# Patient Record
Sex: Female | Born: 1970 | Race: White | Hispanic: No | Marital: Married | Smoking: Current every day smoker
Health system: Southern US, Community
[De-identification: ages and names within clinical notes are randomized; demographics above are authoritative.]

## PROBLEM LIST (undated history)

## (undated) DIAGNOSIS — J449 Chronic obstructive pulmonary disease, unspecified: Secondary | ICD-10-CM

## (undated) DIAGNOSIS — T7840XA Allergy, unspecified, initial encounter: Secondary | ICD-10-CM

## (undated) DIAGNOSIS — F419 Anxiety disorder, unspecified: Secondary | ICD-10-CM

## (undated) DIAGNOSIS — M1990 Unspecified osteoarthritis, unspecified site: Secondary | ICD-10-CM

## (undated) DIAGNOSIS — F32A Depression, unspecified: Secondary | ICD-10-CM

## (undated) DIAGNOSIS — F17200 Nicotine dependence, unspecified, uncomplicated: Secondary | ICD-10-CM

## (undated) DIAGNOSIS — D242 Benign neoplasm of left breast: Secondary | ICD-10-CM

## (undated) DIAGNOSIS — E039 Hypothyroidism, unspecified: Secondary | ICD-10-CM

## (undated) DIAGNOSIS — I10 Essential (primary) hypertension: Secondary | ICD-10-CM

## (undated) DIAGNOSIS — Z8619 Personal history of other infectious and parasitic diseases: Secondary | ICD-10-CM

## (undated) DIAGNOSIS — E119 Type 2 diabetes mellitus without complications: Secondary | ICD-10-CM

## (undated) HISTORY — DX: Personal history of other infectious and parasitic diseases: Z86.19

## (undated) HISTORY — PX: WISDOM TOOTH EXTRACTION: SHX21

## (undated) HISTORY — DX: Allergy, unspecified, initial encounter: T78.40XA

---

## 1990-10-19 HISTORY — PX: TUBAL LIGATION: SHX77

## 1998-07-17 ENCOUNTER — Emergency Department (HOSPITAL_COMMUNITY): Admission: EM | Admit: 1998-07-17 | Discharge: 1998-07-17 | Payer: Self-pay | Admitting: Emergency Medicine

## 1999-07-14 ENCOUNTER — Emergency Department (HOSPITAL_COMMUNITY): Admission: EM | Admit: 1999-07-14 | Discharge: 1999-07-14 | Payer: Self-pay | Admitting: *Deleted

## 1999-07-15 ENCOUNTER — Emergency Department (HOSPITAL_COMMUNITY): Admission: EM | Admit: 1999-07-15 | Discharge: 1999-07-15 | Payer: Self-pay | Admitting: Emergency Medicine

## 1999-10-15 ENCOUNTER — Encounter: Payer: Self-pay | Admitting: Emergency Medicine

## 1999-10-15 ENCOUNTER — Emergency Department (HOSPITAL_COMMUNITY): Admission: EM | Admit: 1999-10-15 | Discharge: 1999-10-15 | Payer: Self-pay | Admitting: Emergency Medicine

## 2000-01-03 ENCOUNTER — Emergency Department (HOSPITAL_COMMUNITY): Admission: EM | Admit: 2000-01-03 | Discharge: 2000-01-03 | Payer: Self-pay | Admitting: Emergency Medicine

## 2000-10-25 ENCOUNTER — Emergency Department (HOSPITAL_COMMUNITY): Admission: EM | Admit: 2000-10-25 | Discharge: 2000-10-25 | Payer: Self-pay | Admitting: Emergency Medicine

## 2002-02-15 ENCOUNTER — Emergency Department (HOSPITAL_COMMUNITY): Admission: EM | Admit: 2002-02-15 | Discharge: 2002-02-16 | Payer: Self-pay | Admitting: Emergency Medicine

## 2004-12-13 ENCOUNTER — Emergency Department (HOSPITAL_COMMUNITY): Admission: EM | Admit: 2004-12-13 | Discharge: 2004-12-13 | Payer: Self-pay | Admitting: Family Medicine

## 2006-03-21 ENCOUNTER — Emergency Department (HOSPITAL_COMMUNITY): Admission: EM | Admit: 2006-03-21 | Discharge: 2006-03-21 | Payer: Self-pay | Admitting: Emergency Medicine

## 2011-01-26 ENCOUNTER — Emergency Department (HOSPITAL_COMMUNITY)
Admission: EM | Admit: 2011-01-26 | Discharge: 2011-01-26 | Disposition: A | Discharge status: Home / Self Care | Payer: Self-pay | Attending: Emergency Medicine | Admitting: Emergency Medicine

## 2011-01-26 DIAGNOSIS — R490 Dysphonia: Secondary | ICD-10-CM | POA: Insufficient documentation

## 2011-01-26 DIAGNOSIS — F17200 Nicotine dependence, unspecified, uncomplicated: Secondary | ICD-10-CM | POA: Insufficient documentation

## 2011-01-26 DIAGNOSIS — J069 Acute upper respiratory infection, unspecified: Secondary | ICD-10-CM | POA: Insufficient documentation

## 2011-01-26 LAB — RAPID STREP SCREEN (MED CTR MEBANE ONLY): Streptococcus, Group A Screen (Direct): NEGATIVE

## 2013-08-24 ENCOUNTER — Emergency Department (HOSPITAL_COMMUNITY): Payer: Self-pay

## 2013-08-24 ENCOUNTER — Emergency Department (HOSPITAL_COMMUNITY)
Admission: EM | Admit: 2013-08-24 | Discharge: 2013-08-24 | Disposition: A | Discharge status: Home / Self Care | Payer: Self-pay | Attending: Emergency Medicine | Admitting: Emergency Medicine

## 2013-08-24 ENCOUNTER — Encounter (HOSPITAL_COMMUNITY): Payer: Self-pay | Admitting: Emergency Medicine

## 2013-08-24 DIAGNOSIS — M7662 Achilles tendinitis, left leg: Secondary | ICD-10-CM

## 2013-08-24 DIAGNOSIS — M7730 Calcaneal spur, unspecified foot: Secondary | ICD-10-CM | POA: Insufficient documentation

## 2013-08-24 DIAGNOSIS — M7732 Calcaneal spur, left foot: Secondary | ICD-10-CM

## 2013-08-24 DIAGNOSIS — M7660 Achilles tendinitis, unspecified leg: Secondary | ICD-10-CM | POA: Insufficient documentation

## 2013-08-24 MED ORDER — IBUPROFEN 400 MG PO TABS
800.0000 mg | ORAL_TABLET | Freq: Once | ORAL | Status: AC
  Administered 2013-08-24: 800 mg via ORAL
  Filled 2013-08-24: qty 2

## 2013-08-24 MED ORDER — IBUPROFEN 800 MG PO TABS: 800.0000 mg | ORAL_TABLET | Freq: Three times a day (TID) | ORAL | Status: DC

## 2013-08-24 NOTE — ED Notes (Signed)
Pt in c/o pain to her right heel with swelling, denies injury, states she is on her feet all day while at work and the swelling is making it hard for her to get her shoe on, increased pain with walking

## 2013-08-24 NOTE — ED Provider Notes (Signed)
CSN: [id number]     Arrival date & time 08/24/13  1718 History  This chart was scribed for non-physician practitioner Dierdre Forth, PA-C working with Shon Baton, MD by Clydene Laming, ED Scribe. This patient was seen in room TR07C/TR07C and the patient's care was started at 5:44 PM.   Chief Complaint  Patient presents with  . Foot Pain    The history is provided by the patient and medical records. No language interpreter was used.   HPI Comments: Donna Whitaker is a 42 y.o. female who presents to the Emergency Department complaining of right heel pain onset four days ago with associated swelling. 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  Her pain, however, does not resolve with rest like her typical foot pain. Pt denies any trauma to the foot. The pain is worsened with pressure on the foot but frequently is painful alone. Pt denies fever, chills, nausea vomiting, redness, numbness, tingling, weakness. Pt denies any other known medical problems.    History reviewed. No pertinent past medical history. No past surgical history on file. No family history on file. History  Substance Use Topics  . Smoking status: Not on file  . Smokeless tobacco: Not on file  . Alcohol Use: Not on file   OB History   Grav Para Term Preterm Abortions TAB SAB Ect Mult Living                 Review of Systems  Constitutional: Negative for fever and chills.  Gastrointestinal: Negative for nausea and vomiting.  Musculoskeletal: Positive for arthralgias, joint swelling and myalgias. Negative for back pain, neck pain and neck stiffness.  Skin: Negative for wound.  Neurological: Negative for numbness.  Hematological: Does not bruise/bleed easily.  Psychiatric/Behavioral: The patient is not nervous/anxious.   All other systems reviewed and are negative.    Allergies  Betadine and Vicodin  Home Medications   Current Outpatient Rx   Name  Route  Sig  Dispense  Refill  . acetaminophen (TYLENOL) 500 MG tablet   Oral   Take 1,000 mg by mouth every 6 (six) hours as needed.         Marland Kitchen ibuprofen (ADVIL,MOTRIN) 100 MG tablet   Oral   Take 200 mg by mouth every 6 (six) hours as needed for fever.         Marland Kitchen ibuprofen (ADVIL,MOTRIN) 800 MG tablet   Oral   Take 1 tablet (800 mg total) by mouth 3 (three) times daily.   21 tablet   0    Triage Vitals:BP 161/101  Pulse 102  Temp(Src) 98 F (36.7 C) (Oral)  Resp 20  Wt 310 lb (140.615 kg)  SpO2 100% Physical Exam  Nursing note and vitals reviewed. Constitutional: She appears well-developed and well-nourished. No distress.  HENT:  Head: Normocephalic and atraumatic.  Eyes: Conjunctivae are normal.  Neck: Normal range of motion.  Cardiovascular: Normal rate, regular rhythm, S1 normal, S2 normal, normal heart sounds and intact distal pulses.   No murmur heard. Pulses:      Radial pulses are 2+ on the right side, and 2+ on the left side.       Dorsalis pedis pulses are 2+ on the right side, and 2+ on the left side.       Posterior tibial pulses are 2+ on the right side, and 2+ on the left side.  Capillary refill less  than 3 seconds  Pulmonary/Chest: Effort normal and breath sounds normal.  Musculoskeletal: Normal range of motion. She exhibits tenderness. She exhibits no edema.       Left foot: She exhibits tenderness and swelling. She exhibits normal range of motion, no bony tenderness, normal capillary refill, no crepitus, no deformity and no laceration.       Feet:  ROM: normal Mild swelling noted to the left calcaneus the Achilles tendon with pain to palpation; no erythema, induration or fluctuance  Neurological: She is alert. Coordination normal.  Reflex Scores:      Patellar reflexes are 2+ on the right side and 2+ on the left side.      Achilles reflexes are 2+ on the right side and 2+ on the left side. Sensation normal Strength normal in the bilateral  lower extremities including dorsiflexion and plantar flexion  Skin: Skin is warm and dry. She is not diaphoretic.  No tenting of the skin  Psychiatric: She has a normal mood and affect.    ED Course  [place] bedside Date/Time: 08/24/2013 6:46 PM Performed by: Dierdre Forth Authorized by: Dierdre Forth Consent: Verbal consent obtained. Risks and benefits: risks, benefits and alternatives were discussed Consent given by: patient Patient understanding: patient states understanding of the procedure being performed Patient consent: the patient's understanding of the procedure matches consent given Procedure consent: procedure consent matches procedure scheduled Relevant documents: relevant documents present and verified Site marked: the operative site was marked Imaging studies: imaging studies available Required items: required blood products, implants, devices, and special equipment available Patient identity confirmed: verbally with patient and arm band Time out: Immediately prior to procedure a "time out" was called to verify the correct patient, procedure, equipment, support staff and site/side marked as required. Preparation: Patient was prepped and draped in the usual sterile fashion. Local anesthesia used: no Patient sedated: no Patient tolerance: Patient tolerated the procedure well with no immediate complications. Comments: Ultrasound of the superficial tissues at the site of swelling of the left Achilles tendon to assess for abscess; no evidence of fluid collection or pocket; no visible tear of the Achilles tendon   (including critical care time) DIAGNOSTIC STUDIES: Oxygen Saturation is 100% on RA, normal by my interpretation.    COORDINATION OF CARE: 5:51 PM- Discussed treatment plan with pt at bedside. Pt verbalized understanding and agreement with plan.   Labs Review Labs Reviewed - No data to display Imaging Review Dg Foot Complete Left  08/24/2013   CLINICAL  DATA:  Left heel pain for 2 days. No known injury.  EXAM: LEFT FOOT - COMPLETE 3+ VIEW  COMPARISON:  None.  FINDINGS: There is no evidence of fracture or dislocation. There is a calcaneal spur. Enthesopathic changes are seen at the Achilles insertion site on the posterior calcaneus. No joint space abnormality or focal bony erosions. No discrete focal soft tissue swelling. Soft tissues are unremarkable.  IMPRESSION: 1. No acute bony abnormality.  2. Calcaneal spurring.   Electronically Signed   By: Britta Mccreedy M.D.   On: 08/24/2013 18:18    EKG Interpretation   None       MDM   1. Calcaneal spur of foot, left   2. Achilles tendonitis, left      FEBE CHAMPA presents with atraumatic pain and swelling over the posterior calcaneous and achilles tendon.  Patient X-Ray negative for obvious fracture or dislocation; with spurring of the calcaneus noted both on the posterior aspect and inferior aspect.  Patient's  pain and swelling is directly over the posterior spur.  I personally reviewed the imaging tests through PACS system.  I reviewed available ER/hospitalization records through the EMR.  Pain managed in ED. Pt advised to follow up with orthopedics/podiatry if symptoms for further evaluation and symptom management. Conservative therapy recommended and discussed. Patient will be dc home & is agreeable with above plan.  She'll be discharged home with prescription for anti-inflammatories.  Vital signs are stable at discharge; tachycardia noted on triage vitals; no tachycardia on physical exam.   BP 161/101  Pulse 102  Temp(Src) 98 F (36.7 C) (Oral)  Resp 20  Wt 310 lb (140.615 kg)  SpO2 100%  LMP 08/10/2013  Patient/guardian has voiced understanding and agreed to follow-up with the PCP or specialist.  I personally performed the services described in this documentation, which was scribed in my presence. The recorded information has been reviewed and is  accurate.          Dierdre Forth, PA-C 08/24/13 1927

## 2013-08-25 NOTE — ED Provider Notes (Signed)
Medical screening examination/treatment/procedure(s) were performed by non-physician practitioner and as supervising physician I was immediately available for consultation/collaboration.  EKG Interpretation   None        Shon Baton, MD 08/25/13 1446

## 2017-04-29 ENCOUNTER — Ambulatory Visit (INDEPENDENT_AMBULATORY_CARE_PROVIDER_SITE_OTHER): Payer: BLUE CROSS/BLUE SHIELD | Admitting: Internal Medicine

## 2017-04-29 ENCOUNTER — Encounter: Payer: Self-pay | Admitting: Internal Medicine

## 2017-04-29 VITALS — BP 148/100 | HR 78 | Temp 97.8°F | Ht 68.25 in | Wt 278.0 lb

## 2017-04-29 DIAGNOSIS — R030 Elevated blood-pressure reading, without diagnosis of hypertension: Secondary | ICD-10-CM | POA: Diagnosis not present

## 2017-04-29 DIAGNOSIS — H578 Other specified disorders of eye and adnexa: Secondary | ICD-10-CM

## 2017-04-29 DIAGNOSIS — H5789 Other specified disorders of eye and adnexa: Secondary | ICD-10-CM

## 2017-04-29 LAB — COMPREHENSIVE METABOLIC PANEL
ALBUMIN: 4.1 g/dL (ref 3.5–5.2)
ALT: 38 U/L — AB (ref 0–35)
AST: 18 U/L (ref 0–37)
Alkaline Phosphatase: 87 U/L (ref 39–117)
BILIRUBIN TOTAL: 0.6 mg/dL (ref 0.2–1.2)
BUN: 13 mg/dL (ref 6–23)
CHLORIDE: 104 meq/L (ref 96–112)
CO2: 30 meq/L (ref 19–32)
CREATININE: 0.7 mg/dL (ref 0.40–1.20)
Calcium: 10.1 mg/dL (ref 8.4–10.5)
GFR: 94.73 mL/min (ref >60.00)
Glucose, Bld: 102 mg/dL — ABNORMAL HIGH (ref 70–99)
Potassium: 4.6 mEq/L (ref 3.5–5.1)
SODIUM: 139 meq/L (ref 135–145)
Total Protein: 7.3 g/dL (ref 6.0–8.3)

## 2017-04-29 LAB — CBC
HCT: 48.5 % — ABNORMAL HIGH (ref 36.0–46.0)
Hemoglobin: 16.6 g/dL — ABNORMAL HIGH (ref 12.0–15.0)
MCHC: 34.1 g/dL (ref 30.0–36.0)
MCV: 92.2 fl (ref 78.0–100.0)
PLATELETS: 275 10*3/uL (ref 150.0–400.0)
RBC: 5.26 Mil/uL — AB (ref 3.87–5.11)
RDW: 13.4 % (ref 11.5–15.5)
WBC: 9.8 10*3/uL (ref 4.0–10.5)

## 2017-04-29 LAB — TSH: TSH: 6.54 u[IU]/mL — ABNORMAL HIGH (ref 0.35–4.50)

## 2017-04-29 MED ORDER — AMLODIPINE BESYLATE 10 MG PO TABS: 10.0000 mg | ORAL_TABLET | Freq: Every day | ORAL | 0 refills | Status: DC

## 2017-04-29 NOTE — Progress Notes (Signed)
HPI  Pt presents to the clinic today to establish care and for management of the conditions listed below. She has not had a PCP in many years.  Seasonal Allergies: Worse in the spring. She takes an OTC antihistamine as needed with good relief.  Elevated Blood Pressure: Her BP today is 148/100. She has been having daily headaches. She has never been treated for HTN in the past. She denies chest pain and shortness of breath.   Flu: never Tetanus: unsure Pap Smear: > 5 years ago Mammogram: never Vision Screening: as needed Dentist: as needed   Past Medical History:  Diagnosis Date  . Allergy   . History of chicken pox     Current Outpatient Prescriptions  Medication Sig Dispense Refill  . Aspirin-Caffeine (BC FAST PAIN RELIEF ARTHRITIS PO) Take 1 tablet by mouth daily.    Marland Kitchen ibuprofen (ADVIL,MOTRIN) 200 MG tablet Take 800 mg by mouth daily as needed.     No current facility-administered medications for this visit.     Allergies  Allergen Reactions  . Betadine [Povidone Iodine] Other (See Comments)    Skin irritation  . Vicodin [Hydrocodone-Acetaminophen] Palpitations    Chest pain    Family History  Problem Relation Age of Onset  . Arthritis Mother   . Hyperlipidemia Mother   . Alcohol abuse Father   . Hyperlipidemia Father   . Heart disease Father   . Hypertension Father   . Arthritis Maternal Grandmother   . Hyperlipidemia Maternal Grandmother   . Heart disease Maternal Grandmother   . Hypertension Maternal Grandmother   . Diabetes Maternal Grandmother   . Hyperlipidemia Maternal Grandfather   . Hypertension Maternal Grandfather   . Hyperlipidemia Paternal Grandmother   . Hypertension Paternal Grandmother   . Stomach cancer Paternal Grandmother   . Alcohol abuse Paternal Grandfather   . Hyperlipidemia Paternal Grandfather   . Heart disease Paternal Grandfather   . Hypertension Paternal Grandfather     Social History   Social History  . Marital status:  Married    Spouse name: N/A  . Number of children: N/A  . Years of education: N/A   Occupational History  . Not on file.   Social History Main Topics  . Smoking status: Current Every Day Smoker    Packs/day: 1.50    Types: Cigarettes  . Smokeless tobacco: Never Used  . Alcohol use Yes     Comment: occasional  . Drug use: No  . Sexual activity: Not on file   Other Topics Concern  . Not on file   Social History Narrative  . No narrative on file    ROS:  Constitutional: Pt reports headaches. Denies fever, malaise, fatigue, or abrupt weight changes.  HEENT: Pt reports eye redness and tearing. Denies eye pain, ear pain, ringing in the ears, wax buildup, runny nose, nasal congestion, bloody nose, or sore throat. Respiratory: Denies difficulty breathing, shortness of breath, cough or sputum production.   Cardiovascular: Denies chest pain, chest tightness, palpitations or swelling in the hands or feet.  GNeurological: Denies dizziness, difficulty with memory, difficulty with speech or problems with balance and coordination.  Psych: Denies anxiety, depression, SI/HI.  No other specific complaints in a complete review of systems (except as listed in HPI above).  PE:  BP (!) 148/100   Pulse 78   Temp 97.8 F (36.6 C) (Oral)   Ht 5' 8.25" (1.734 m)   Wt 278 lb (126.1 kg)   LMP 05/19/2016   SpO2  94%   BMI 41.96 kg/m  Wt Readings from Last 3 Encounters:  04/29/17 278 lb (126.1 kg)  08/24/13 (!) 310 lb (140.6 kg)    General: Appears her stated age, obese in NAD. HEENT: Head: normal shape and size; Eyes: sclera injected, no icterus, conjunctiva pink, PERRLA and EOMs intact; Neck: Neck supple, trachea midline. No masses, lumps or thyromegaly present.  Cardiovascular: Normal rate and rhythm. S1,S2 noted.  No murmur, rubs or gallops noted.  Pulmonary/Chest: Normal effort and coarse vesicular breath sounds. No respiratory distress. No wheezes, rales or ronchi noted.   Neurological: Alert and oriented.   Psychiatric: She is tearful, but she reports its because her head hurts.    Assessment and Plan:  Eye Redness:  ? Allergic Conjunctivitis Try Visine Allergy OTC  Elevated Blood Pressure:  She reports HTN for the last 3-4 years Will check CBC, CMET and TSH today Will start Amlodipine 10 mg daily  RTC in 3 weeks for follow up HTN Hope Brandenburger, NP

## 2017-04-29 NOTE — Patient Instructions (Signed)

## 2017-05-20 ENCOUNTER — Ambulatory Visit (INDEPENDENT_AMBULATORY_CARE_PROVIDER_SITE_OTHER): Payer: BLUE CROSS/BLUE SHIELD | Admitting: Internal Medicine

## 2017-05-20 ENCOUNTER — Encounter: Payer: Self-pay | Admitting: Internal Medicine

## 2017-05-20 VITALS — BP 132/86 | HR 86 | Temp 98.2°F | Wt 281.0 lb

## 2017-05-20 DIAGNOSIS — R519 Headache, unspecified: Secondary | ICD-10-CM

## 2017-05-20 DIAGNOSIS — E010 Iodine-deficiency related diffuse (endemic) goiter: Secondary | ICD-10-CM | POA: Diagnosis not present

## 2017-05-20 DIAGNOSIS — R946 Abnormal results of thyroid function studies: Secondary | ICD-10-CM | POA: Diagnosis not present

## 2017-05-20 DIAGNOSIS — R5383 Other fatigue: Secondary | ICD-10-CM

## 2017-05-20 DIAGNOSIS — I10 Essential (primary) hypertension: Secondary | ICD-10-CM | POA: Diagnosis not present

## 2017-05-20 DIAGNOSIS — R51 Headache: Secondary | ICD-10-CM | POA: Diagnosis not present

## 2017-05-20 DIAGNOSIS — R7989 Other specified abnormal findings of blood chemistry: Secondary | ICD-10-CM

## 2017-05-20 DIAGNOSIS — R635 Abnormal weight gain: Secondary | ICD-10-CM | POA: Diagnosis not present

## 2017-05-20 LAB — TSH: TSH: 6.52 u[IU]/mL — AB (ref 0.35–4.50)

## 2017-05-20 LAB — T4, FREE: Free T4: 0.73 ng/dL (ref 0.60–1.60)

## 2017-05-20 MED ORDER — AMLODIPINE BESYLATE 10 MG PO TABS: 10.0000 mg | ORAL_TABLET | Freq: Every day | ORAL | 1 refills | Status: DC

## 2017-05-20 MED ORDER — LEVOTHYROXINE SODIUM 25 MCG PO TABS: 25.0000 ug | ORAL_TABLET | Freq: Every day | ORAL | 0 refills | Status: DC

## 2017-05-20 NOTE — Patient Instructions (Signed)

## 2017-05-20 NOTE — Assessment & Plan Note (Signed)
Improved I do not want to add additional medication at this time, I would like to give it more time Amlodipine refilled today Discussed DASH diet and exercise for weight loss Discussed smoking cessation

## 2017-05-20 NOTE — Progress Notes (Signed)
Subjective:    Patient ID: Donna Whitaker, female    DOB: 11/06/1970, 46 y.o.   MRN: [id number]  HPI  Pt presents to the clinic today for 3 week follow up of HTN. At her last visit, her BP was elevated at 148/100. She was started on Amlodipine 10 mg daily. She has been taking the medication as prescribed. She denies adverse side effects. Her BP today is 132/86. She occasionally has a mild headache but nothing as severe as it was prior. There is no ECG on file.  She also is due to follow up elevated TSH. Her level came back at 6.54. She feels fatigued, has noticed weight gain and feels moody all the time. She has never been treated for her thyroid in the past.  Review of Systems      Past Medical History:  Diagnosis Date  . Allergy   . History of chicken pox     Current Outpatient Prescriptions  Medication Sig Dispense Refill  . amLODipine (NORVASC) 10 MG tablet Take 1 tablet (10 mg total) by mouth daily. 30 tablet 0  . Aspirin-Caffeine (BC FAST PAIN RELIEF ARTHRITIS PO) Take 1 tablet by mouth daily.    Marland Kitchen ibuprofen (ADVIL,MOTRIN) 200 MG tablet Take 800 mg by mouth daily as needed.     No current facility-administered medications for this visit.     Allergies  Allergen Reactions  . Betadine [Povidone Iodine] Other (See Comments)    Skin irritation  . Vicodin [Hydrocodone-Acetaminophen] Palpitations    Chest pain    Family History  Problem Relation Age of Onset  . Arthritis Mother   . Hyperlipidemia Mother   . Alcohol abuse Father   . Hyperlipidemia Father   . Heart disease Father   . Hypertension Father   . Arthritis Maternal Grandmother   . Hyperlipidemia Maternal Grandmother   . Heart disease Maternal Grandmother   . Hypertension Maternal Grandmother   . Diabetes Maternal Grandmother   . Hyperlipidemia Maternal Grandfather   . Hypertension Maternal Grandfather   . Hyperlipidemia Paternal Grandmother   . Hypertension Paternal Grandmother   . Stomach cancer  Paternal Grandmother   . Alcohol abuse Paternal Grandfather   . Hyperlipidemia Paternal Grandfather   . Heart disease Paternal Grandfather   . Hypertension Paternal Grandfather     Social History   Social History  . Marital status: Married    Spouse name: N/A  . Number of children: N/A  . Years of education: N/A   Occupational History  . Not on file.   Social History Main Topics  . Smoking status: Current Every Day Smoker    Packs/day: 1.50    Years: 35.00    Types: Cigarettes  . Smokeless tobacco: Never Used  . Alcohol use Yes     Comment: occasional  . Drug use: No  . Sexual activity: Yes   Other Topics Concern  . Not on file   Social History Narrative  . No narrative on file     Constitutional: Pt reports fatigue, headaches and weight gain. Denies fever, malaise.  Respiratory: Denies difficulty breathing, shortness of breath, cough or sputum production.   Cardiovascular: Denies chest pain, chest tightness, palpitations or swelling in the hands or feet.  Neurological: Denies dizziness, difficulty with memory, difficulty with speech or problems with balance and coordination.  Psych: Pt reports moodiness. Denies anxiety, depression, SI/HI.  No other specific complaints in a complete review of systems (except as listed in HPI above).  Objective:   Physical Exam  BP 132/86   Pulse 86   Temp 98.2 F (36.8 C) (Oral)   Wt 281 lb (127.5 kg)   LMP 05/19/2016   SpO2 96%   BMI 42.41 kg/m  Wt Readings from Last 3 Encounters:  05/20/17 281 lb (127.5 kg)  04/29/17 278 lb (126.1 kg)  08/24/13 (!) 310 lb (140.6 kg)    General: Appears her stated age, obese in NAD. Neck:  Neck supple, trachea midline. No masses, lumps present. thyromegaly noted. Cardiovascular: Normal rate and rhythm. S1,S2 noted.  No murmur, rubs or gallops noted.  Pulmonary/Chest: Normal effort and positive vesicular breath sounds. No respiratory distress. No wheezes, rales or ronchi noted.    Neurological: Alert and oriented.  Psychiatric: Mood and affect mildly flat.  BMET    Component Value Date/Time   NA 139 04/29/2017 1123   K 4.6 04/29/2017 1123   CL 104 04/29/2017 1123   CO2 30 04/29/2017 1123   GLUCOSE 102 (H) 04/29/2017 1123   BUN 13 04/29/2017 1123   CREATININE 0.70 04/29/2017 1123   CALCIUM 10.1 04/29/2017 1123    Lipid Panel  No results found for: CHOL, TRIG, HDL, CHOLHDL, VLDL, LDLCALC  CBC    Component Value Date/Time   WBC 9.8 04/29/2017 1123   RBC 5.26 (H) 04/29/2017 1123   HGB 16.6 (H) 04/29/2017 1123   HCT 48.5 (H) 04/29/2017 1123   PLT 275.0 04/29/2017 1123   MCV 92.2 04/29/2017 1123   MCHC 34.1 04/29/2017 1123   RDW 13.4 04/29/2017 1123    Hgb A1C No results found for: HGBA1C          Assessment & Plan:   Elevated TSH with Fatigue, Headaches, Abnormal Weight Gain and Thyromegaly:  Will repeat TSH and Free T4 today Will start Synthroid if needed based on labs  RTC in 6 months for your annual exam Webb Silversmith, NP

## 2017-05-20 NOTE — Addendum Note (Signed)
Addended by: Lurlean Nanny on: 05/20/2017 05:53 PM   Modules accepted: Orders

## 2017-06-17 ENCOUNTER — Encounter: Payer: Self-pay | Admitting: Internal Medicine

## 2017-06-17 ENCOUNTER — Ambulatory Visit (INDEPENDENT_AMBULATORY_CARE_PROVIDER_SITE_OTHER): Payer: BLUE CROSS/BLUE SHIELD | Admitting: Internal Medicine

## 2017-06-17 ENCOUNTER — Other Ambulatory Visit: Payer: BLUE CROSS/BLUE SHIELD

## 2017-06-17 VITALS — BP 138/80 | HR 79 | Temp 98.0°F | Wt 285.0 lb

## 2017-06-17 DIAGNOSIS — R590 Localized enlarged lymph nodes: Secondary | ICD-10-CM

## 2017-06-17 DIAGNOSIS — B353 Tinea pedis: Secondary | ICD-10-CM | POA: Diagnosis not present

## 2017-06-17 DIAGNOSIS — H04203 Unspecified epiphora, bilateral lacrimal glands: Secondary | ICD-10-CM | POA: Diagnosis not present

## 2017-06-17 DIAGNOSIS — R7989 Other specified abnormal findings of blood chemistry: Secondary | ICD-10-CM

## 2017-06-17 DIAGNOSIS — E039 Hypothyroidism, unspecified: Secondary | ICD-10-CM

## 2017-06-17 DIAGNOSIS — R946 Abnormal results of thyroid function studies: Secondary | ICD-10-CM

## 2017-06-17 DIAGNOSIS — L989 Disorder of the skin and subcutaneous tissue, unspecified: Secondary | ICD-10-CM

## 2017-06-17 MED ORDER — SERTACONAZOLE NITRATE 2 % EX CREA: 1.0000 "application " | TOPICAL_CREAM | Freq: Two times a day (BID) | CUTANEOUS | 0 refills | Status: DC

## 2017-06-17 NOTE — Progress Notes (Signed)
Subjective:    Patient ID: Donna Whitaker, female    DOB: 12/04/1970, 46 y.o.   MRN: [id number]  HPI  Pt presents to the clinic today with c/o tenderness on the back of her head. She noticed this 2-3 days ago. It started on the right side and now seems to be tender on the left side. She has noticed a scab on the back of her head, but it is not tender and she has not noticed any discharge from the area. She has had some watery eyes, but denies ear pain, runny nose, nasal congestion or sore throat. She has not taken anything OTC for her symptoms. She does have a history of seasonal allergies. She has not had sick contacts that she is aware of.  She is also due to have her TSH repeated today. She will need a refill of her Synthroid once her labs are back.   She also c/o a rash between her 2nd/3rd toes. The rash burns. It has not spread. She has tried Lotrimin with minimal relief.  Review of Systems      Past Medical History:  Diagnosis Date  . Allergy   . History of chicken pox     Current Outpatient Prescriptions  Medication Sig Dispense Refill  . amLODipine (NORVASC) 10 MG tablet Take 1 tablet (10 mg total) by mouth daily. 90 tablet 1  . Aspirin-Caffeine (BC FAST PAIN RELIEF ARTHRITIS PO) Take 1 tablet by mouth daily.    Marland Kitchen ibuprofen (ADVIL,MOTRIN) 200 MG tablet Take 800 mg by mouth daily as needed.    Marland Kitchen levothyroxine (SYNTHROID, LEVOTHROID) 25 MCG tablet Take 1 tablet (25 mcg total) by mouth daily before breakfast. 30 tablet 0   No current facility-administered medications for this visit.     Allergies  Allergen Reactions  . Betadine [Povidone Iodine] Other (See Comments)    Skin irritation  . Vicodin [Hydrocodone-Acetaminophen] Palpitations    Chest pain    Family History  Problem Relation Age of Onset  . Arthritis Mother   . Hyperlipidemia Mother   . Alcohol abuse Father   . Hyperlipidemia Father   . Heart disease Father   . Hypertension Father   . Arthritis  Maternal Grandmother   . Hyperlipidemia Maternal Grandmother   . Heart disease Maternal Grandmother   . Hypertension Maternal Grandmother   . Diabetes Maternal Grandmother   . Hyperlipidemia Maternal Grandfather   . Hypertension Maternal Grandfather   . Hyperlipidemia Paternal Grandmother   . Hypertension Paternal Grandmother   . Stomach cancer Paternal Grandmother   . Alcohol abuse Paternal Grandfather   . Hyperlipidemia Paternal Grandfather   . Heart disease Paternal Grandfather   . Hypertension Paternal Grandfather     Social History   Social History  . Marital status: Married    Spouse name: N/A  . Number of children: N/A  . Years of education: N/A   Occupational History  . Not on file.   Social History Main Topics  . Smoking status: Current Every Day Smoker    Packs/day: 1.50    Years: 35.00    Types: Cigarettes  . Smokeless tobacco: Never Used  . Alcohol use Yes     Comment: occasional  . Drug use: No  . Sexual activity: Yes   Other Topics Concern  . Not on file   Social History Narrative  . No narrative on file     Constitutional: Denies fever, malaise, fatigue, headache or abrupt weight changes.  Skin:  Pt reports lesion of back of head, rash between toes. HEENT: Pt reports watery eyes. Denies eye pain, eye redness, ear pain, ringing in the ears, wax buildup, runny nose, nasal congestion, bloody nose, or sore throat. Respiratory: Denies difficulty breathing, shortness of breath, cough or sputum production.   Cardiovascular: Denies chest pain, chest tightness, palpitations or swelling in the hands or feet.  Neurological: Denies dizziness, difficulty with memory, difficulty with speech or problems with balance and coordination.    No other specific complaints in a complete review of systems (except as listed in HPI above).  Objective:   Physical Exam    BP 138/80   Pulse 79   Temp 98 F (36.7 C) (Oral)   Wt 285 lb (129.3 kg)   LMP 05/19/2016    SpO2 96%   BMI 43.02 kg/m  Wt Readings from Last 3 Encounters:  06/17/17 285 lb (129.3 kg)  05/20/17 281 lb (127.5 kg)  04/29/17 278 lb (126.1 kg)    General: Appears her stated age, in NAD. Skin: Scabbed mole note to posterior head. Bilateral occipital adenopathy noted. Fungal infection noted between toes. HEENT:  Eyes: sclera slightly injected, no icterus, conjunctiva pink, clear drainage noted;  Neck:  Neck supple, trachea midline. No masses, lumps  present.   BMET    Component Value Date/Time   NA 139 04/29/2017 1123   K 4.6 04/29/2017 1123   CL 104 04/29/2017 1123   CO2 30 04/29/2017 1123   GLUCOSE 102 (H) 04/29/2017 1123   BUN 13 04/29/2017 1123   CREATININE 0.70 04/29/2017 1123   CALCIUM 10.1 04/29/2017 1123    Lipid Panel  No results found for: CHOL, TRIG, HDL, CHOLHDL, VLDL, LDLCALC  CBC    Component Value Date/Time   WBC 9.8 04/29/2017 1123   RBC 5.26 (H) 04/29/2017 1123   HGB 16.6 (H) 04/29/2017 1123   HCT 48.5 (H) 04/29/2017 1123   PLT 275.0 04/29/2017 1123   MCV 92.2 04/29/2017 1123   MCHC 34.1 04/29/2017 1123   RDW 13.4 04/29/2017 1123    Hgb A1C No results found for: HGBA1C        Assessment & Plan:   Hypothyroidism:  TSH repeated today Will adjust and refill Synthroid based on labs  Tinea Pedis:  eRx for Sertaconazole cream BID until resolved Keep the area as dry as possible  Watery Eyes:  Likely allergy related Start Zyrtec OTC  Lesion of Scalp with Adenopathy:  No intervention needed Should resolve with time Will monitor  Return precautions discussed Webb Silversmith, NP

## 2017-06-18 ENCOUNTER — Other Ambulatory Visit: Payer: BLUE CROSS/BLUE SHIELD

## 2017-06-18 ENCOUNTER — Telehealth: Payer: Self-pay | Admitting: Internal Medicine

## 2017-06-18 DIAGNOSIS — R7989 Other specified abnormal findings of blood chemistry: Secondary | ICD-10-CM

## 2017-06-18 LAB — TSH: TSH: 8.98 u[IU]/mL — AB (ref 0.35–4.50)

## 2017-06-18 MED ORDER — LEVOTHYROXINE SODIUM 50 MCG PO TABS: 50.0000 ug | ORAL_TABLET | Freq: Every day | ORAL | 1 refills | Status: DC

## 2017-06-18 NOTE — Telephone Encounter (Signed)
Caller Name:Melisa Chamber  Relationship to Patient:self  Best number:[phone] Pharmacy:[facility] [address]   Reason for call:  Pt is calling about thyroid labs, she only has 1 thyroid pill left, will be out over the weekend.Marland Kitchen

## 2017-06-18 NOTE — Patient Instructions (Signed)
Athlete's Foot Athlete's foot (tinea pedis) is a fungal infection of the skin on the feet. It often occurs on the skin that is between or underneath the toes. It can also occur on the soles of the feet. The infection can spread from person to person (is contagious). Follow these instructions at home:  Apply or take over-the-counter and prescription medicines only as told by your doctor.  Keep all follow-up visits as told by your doctor. This is important.  Do not scratch your feet.  Keep your feet dry: ? Wear cotton or wool socks. Change your socks every day or if they become wet. ? Wear shoes that allow air to move around, such as sandals or canvas tennis shoes.  Wash and dry your feet: ? Every day or as told by your doctor. ? After exercising. ? Including the area between your toes.  Wear sandals in wet areas, such as locker rooms and shared showers.  Do not share any of these items: ? Towels. ? Nail clippers. ? Other personal items that touch your feet.  If you have diabetes, keep your blood sugar under control. Contact a doctor if:  You have a fever.  You have swelling, soreness, warmth, or redness in your foot.  You are not getting better with treatment.  Your symptoms get worse.  You have new symptoms. This information is not intended to replace advice given to you by your health care provider. Make sure you discuss any questions you have with your health care provider. Document Released: 03/23/2008 Document Revised: 03/12/2016 Document Reviewed: 04/08/2015 Elsevier Interactive Patient Education  2018 Elsevier Inc.  

## 2017-06-18 NOTE — Addendum Note (Signed)
Addended by: Lurlean Nanny on: 06/18/2017 05:26 PM   Modules accepted: Orders

## 2017-07-16 ENCOUNTER — Other Ambulatory Visit (INDEPENDENT_AMBULATORY_CARE_PROVIDER_SITE_OTHER): Payer: BLUE CROSS/BLUE SHIELD

## 2017-07-16 DIAGNOSIS — R946 Abnormal results of thyroid function studies: Secondary | ICD-10-CM

## 2017-07-16 DIAGNOSIS — R7989 Other specified abnormal findings of blood chemistry: Secondary | ICD-10-CM

## 2017-07-16 LAB — TSH: TSH: 6.18 u[IU]/mL — ABNORMAL HIGH (ref 0.35–4.50)

## 2017-07-16 MED ORDER — LEVOTHYROXINE SODIUM 75 MCG PO TABS: 75.0000 ug | ORAL_TABLET | Freq: Every day | ORAL | 1 refills | Status: DC

## 2017-07-16 NOTE — Addendum Note (Signed)
Addended by: Lurlean Nanny on: 07/16/2017 05:15 PM   Modules accepted: Orders

## 2017-08-13 ENCOUNTER — Other Ambulatory Visit (INDEPENDENT_AMBULATORY_CARE_PROVIDER_SITE_OTHER): Payer: BLUE CROSS/BLUE SHIELD

## 2017-08-13 DIAGNOSIS — R7989 Other specified abnormal findings of blood chemistry: Secondary | ICD-10-CM

## 2017-08-13 LAB — TSH: TSH: 6.39 u[IU]/mL — ABNORMAL HIGH (ref 0.35–4.50)

## 2017-08-16 ENCOUNTER — Encounter: Payer: Self-pay | Admitting: Internal Medicine

## 2017-08-16 DIAGNOSIS — R7989 Other specified abnormal findings of blood chemistry: Secondary | ICD-10-CM

## 2017-08-16 MED ORDER — LEVOTHYROXINE SODIUM 100 MCG PO TABS: 100.0000 ug | ORAL_TABLET | Freq: Every day | ORAL | 1 refills | Status: DC

## 2017-09-17 ENCOUNTER — Other Ambulatory Visit (INDEPENDENT_AMBULATORY_CARE_PROVIDER_SITE_OTHER): Payer: BLUE CROSS/BLUE SHIELD

## 2017-09-17 DIAGNOSIS — R7989 Other specified abnormal findings of blood chemistry: Secondary | ICD-10-CM

## 2017-09-17 LAB — TSH: TSH: 3.35 u[IU]/mL (ref 0.35–4.50)

## 2017-10-14 ENCOUNTER — Other Ambulatory Visit: Payer: Self-pay | Admitting: Internal Medicine

## 2017-10-14 MED ORDER — LEVOTHYROXINE SODIUM 100 MCG PO TABS: 100.0000 ug | ORAL_TABLET | Freq: Every day | ORAL | 1 refills | Status: DC

## 2017-11-23 ENCOUNTER — Ambulatory Visit: Payer: BLUE CROSS/BLUE SHIELD | Admitting: Internal Medicine

## 2017-11-23 ENCOUNTER — Encounter: Payer: Self-pay | Admitting: Internal Medicine

## 2017-11-23 ENCOUNTER — Other Ambulatory Visit (HOSPITAL_COMMUNITY)
Admission: RE | Admit: 2017-11-23 | Discharge: 2017-11-23 | Disposition: A | Discharge status: Home / Self Care | Payer: BLUE CROSS/BLUE SHIELD | Source: Ambulatory Visit | Attending: Internal Medicine | Admitting: Internal Medicine

## 2017-11-23 ENCOUNTER — Encounter: Payer: BLUE CROSS/BLUE SHIELD | Admitting: Internal Medicine

## 2017-11-23 VITALS — BP 142/82 | HR 79 | Temp 98.3°F | Ht 68.0 in | Wt 306.5 lb

## 2017-11-23 DIAGNOSIS — Z114 Encounter for screening for human immunodeficiency virus [HIV]: Secondary | ICD-10-CM

## 2017-11-23 DIAGNOSIS — G4719 Other hypersomnia: Secondary | ICD-10-CM

## 2017-11-23 DIAGNOSIS — R0683 Snoring: Secondary | ICD-10-CM | POA: Diagnosis not present

## 2017-11-23 DIAGNOSIS — Z13220 Encounter for screening for lipoid disorders: Secondary | ICD-10-CM | POA: Diagnosis not present

## 2017-11-23 DIAGNOSIS — Z0000 Encounter for general adult medical examination without abnormal findings: Secondary | ICD-10-CM | POA: Diagnosis not present

## 2017-11-23 DIAGNOSIS — R590 Localized enlarged lymph nodes: Secondary | ICD-10-CM

## 2017-11-23 DIAGNOSIS — E039 Hypothyroidism, unspecified: Secondary | ICD-10-CM | POA: Diagnosis not present

## 2017-11-23 DIAGNOSIS — N6320 Unspecified lump in the left breast, unspecified quadrant: Secondary | ICD-10-CM | POA: Diagnosis not present

## 2017-11-23 DIAGNOSIS — Z131 Encounter for screening for diabetes mellitus: Secondary | ICD-10-CM

## 2017-11-23 DIAGNOSIS — E559 Vitamin D deficiency, unspecified: Secondary | ICD-10-CM

## 2017-11-23 DIAGNOSIS — I10 Essential (primary) hypertension: Secondary | ICD-10-CM | POA: Diagnosis not present

## 2017-11-23 DIAGNOSIS — Z23 Encounter for immunization: Secondary | ICD-10-CM | POA: Diagnosis not present

## 2017-11-23 DIAGNOSIS — G479 Sleep disorder, unspecified: Secondary | ICD-10-CM | POA: Diagnosis not present

## 2017-11-23 LAB — LIPID PANEL
Cholesterol: 164 mg/dL (ref 0–200)
HDL: 45.8 mg/dL (ref >39.00)
LDL Cholesterol: 101 mg/dL — ABNORMAL HIGH (ref 0–99)
NONHDL: 117.91
Total CHOL/HDL Ratio: 4
Triglycerides: 85 mg/dL (ref 0.0–149.0)
VLDL: 17 mg/dL (ref 0.0–40.0)

## 2017-11-23 LAB — CBC
HEMATOCRIT: 46.4 % — AB (ref 36.0–46.0)
Hemoglobin: 15.8 g/dL — ABNORMAL HIGH (ref 12.0–15.0)
MCHC: 34 g/dL (ref 30.0–36.0)
MCV: 91.7 fl (ref 78.0–100.0)
Platelets: 256 10*3/uL (ref 150.0–400.0)
RBC: 5.06 Mil/uL (ref 3.87–5.11)
RDW: 13.4 % (ref 11.5–15.5)
WBC: 7.3 10*3/uL (ref 4.0–10.5)

## 2017-11-23 LAB — COMPREHENSIVE METABOLIC PANEL
ALK PHOS: 87 U/L (ref 39–117)
ALT: 38 U/L — ABNORMAL HIGH (ref 0–35)
AST: 18 U/L (ref 0–37)
Albumin: 4 g/dL (ref 3.5–5.2)
BUN: 16 mg/dL (ref 6–23)
CO2: 31 mEq/L (ref 19–32)
CREATININE: 0.64 mg/dL (ref 0.40–1.20)
Calcium: 9.4 mg/dL (ref 8.4–10.5)
Chloride: 103 mEq/L (ref 96–112)
GFR: 104.8 mL/min (ref >60.00)
Glucose, Bld: 98 mg/dL (ref 70–99)
POTASSIUM: 4.7 meq/L (ref 3.5–5.1)
SODIUM: 141 meq/L (ref 135–145)
TOTAL PROTEIN: 7.1 g/dL (ref 6.0–8.3)
Total Bilirubin: 0.6 mg/dL (ref 0.2–1.2)

## 2017-11-23 LAB — HEMOGLOBIN A1C: HEMOGLOBIN A1C: 6.2 % (ref 4.6–6.5)

## 2017-11-23 LAB — VITAMIN D 25 HYDROXY (VIT D DEFICIENCY, FRACTURES): VITD: 10.48 ng/mL — AB (ref 30.00–100.00)

## 2017-11-23 LAB — TSH: TSH: 3.78 u[IU]/mL (ref 0.35–4.50)

## 2017-11-23 LAB — T4, FREE: Free T4: 0.95 ng/dL (ref 0.60–1.60)

## 2017-11-23 MED ORDER — HYDROCHLOROTHIAZIDE 25 MG PO TABS: 25.0000 mg | ORAL_TABLET | Freq: Every day | ORAL | 0 refills | Status: DC

## 2017-11-23 NOTE — Assessment & Plan Note (Signed)
Encouraged her to consume a DASH diet and exercise for weight loss Continue Amlodipine, although may be contributing to edema eRx for HCTZ 25 mg daily CMET today  RTC in 3 weeks for blood pressure follow up

## 2017-11-23 NOTE — Assessment & Plan Note (Signed)
Will check TSH and Free T4 today Will adjust Synthroid if needed based on labs 

## 2017-11-23 NOTE — Progress Notes (Signed)
Subjective:    Patient ID: Donna Whitaker, female    DOB: 05-13-1971, 47 y.o.   MRN: [id number]  HPI  Pt presents to the clinic today for her annual exam. She is also due for follow up of chronic conditions.  Hypothyroidism: She has noticed weight gain and fatigue. She is taking Synthroid as prescribed.  HTN: Her BP today is 142/82. She is taking Amlodipine daily as prescribed. There is no ECG on file.  She c/o a lump in her left armpit. She noticed this 2 months ago. The lump is hard. It has not changed in shape or size. She then noticed a lump left breast 1 week ago. She reports a rash over the lump in her left breast. The rash has not spread. She has not taken anything OTC for her symptoms. She has never had a mammogram.  She also c/o drainage from her right nostril. She notices that thin, clear fluid drains out when she bend's down. She reports the fluid is not like mucous, more like water. She does not an antihistamine OTC.  She also c/o fatigue. This has been going on for months. She has difficulty staying asleep, averaging 5-6 hours at night. Her husband reports that she does snore. She does not feel rested when she wakes up. She does nap during the day.   Flu: never Tetanus: > 10 years ago Pap Smear: more than 10 years Mammogram:  never Vision Screening: as needed Dentist: as needed  Diet: She does eat meat. She consumes more veggies than fruit. She does eat fried food. She drinks mostly Dt. Soda. Exercise: None  Review of Systems      Past Medical History:  Diagnosis Date  . Allergy   . History of chicken pox     Current Outpatient Medications  Medication Sig Dispense Refill  . amLODipine (NORVASC) 10 MG tablet Take 1 tablet (10 mg total) by mouth daily. 90 tablet 1  . Aspirin-Caffeine (BC FAST PAIN RELIEF ARTHRITIS PO) Take 1 tablet by mouth daily.    Marland Kitchen ibuprofen (ADVIL,MOTRIN) 200 MG tablet Take 800 mg by mouth daily as needed.    Marland Kitchen levothyroxine (SYNTHROID,  LEVOTHROID) 100 MCG tablet Take 1 tablet (100 mcg total) by mouth daily. 30 tablet 1  . Sertaconazole Nitrate 2 % CREA Apply 1 application topically 2 (two) times daily. 30 g 0   No current facility-administered medications for this visit.     Allergies  Allergen Reactions  . Betadine [Povidone Iodine] Other (See Comments)    Skin irritation  . Vicodin [Hydrocodone-Acetaminophen] Palpitations    Chest pain    Family History  Problem Relation Age of Onset  . Arthritis Mother   . Hyperlipidemia Mother   . Alcohol abuse Father   . Hyperlipidemia Father   . Heart disease Father   . Hypertension Father   . Arthritis Maternal Grandmother   . Hyperlipidemia Maternal Grandmother   . Heart disease Maternal Grandmother   . Hypertension Maternal Grandmother   . Diabetes Maternal Grandmother   . Hyperlipidemia Maternal Grandfather   . Hypertension Maternal Grandfather   . Hyperlipidemia Paternal Grandmother   . Hypertension Paternal Grandmother   . Stomach cancer Paternal Grandmother   . Alcohol abuse Paternal Grandfather   . Hyperlipidemia Paternal Grandfather   . Heart disease Paternal Grandfather   . Hypertension Paternal Grandfather     Social History   Socioeconomic History  . Marital status: Married    Spouse name: Not on  file  . Number of children: Not on file  . Years of education: Not on file  . Highest education level: Not on file  Social Needs  . Financial resource strain: Not on file  . Food insecurity - worry: Not on file  . Food insecurity - inability: Not on file  . Transportation needs - medical: Not on file  . Transportation needs - non-medical: Not on file  Occupational History  . Not on file  Tobacco Use  . Smoking status: Current Every Day Smoker    Packs/day: 1.50    Years: 35.00    Pack years: 52.50    Types: Cigarettes  . Smokeless tobacco: Never Used  Substance and Sexual Activity  . Alcohol use: Yes    Comment: occasional  . Drug use: No    . Sexual activity: Yes  Other Topics Concern  . Not on file  Social History Narrative  . Not on file     Constitutional: Pt reports fatigue and weight gain. Denies fever, malaise, headache.  HEENT: Pt reports runny nose. Denies eye pain, eye redness, ear pain, ringing in the ears, wax buildup, nasal congestion, bloody nose, or sore throat. Respiratory: Denies difficulty breathing, shortness of breath, cough or sputum production.   Cardiovascular: Pt reports edema in legs. Denies chest pain, chest tightness, palpitations or swelling in the hands Gastrointestinal: Denies abdominal pain, bloating, constipation, diarrhea or blood in the stool.  GU: Denies urgency, frequency, pain with urination, burning sensation, blood in urine, odor or discharge. Musculoskeletal: Denies decrease in range of motion, difficulty with gait, muscle pain or joint pain and swelling.  Skin: Pt reports lump in left breast and left armpit, rash of left breast. Denies redness, lesions or ulcercations.  Neurological: Denies dizziness, difficulty with memory, difficulty with speech or problems with balance and coordination.  Psych: Denies anxiety, depression, SI/HI.  No other specific complaints in a complete review of systems (except as listed in HPI above).  Objective:   Physical Exam  BP (!) 142/82   Pulse 79   Temp 98.3 F (36.8 C) (Oral)   Ht 5\' 8"  (1.727 m)   Wt (!) 306 lb 8 oz (139 kg)   LMP 05/19/2016   SpO2 95%   BMI 46.60 kg/m  Wt Readings from Last 3 Encounters:  11/23/17 (!) 306 lb 8 oz (139 kg)  06/17/17 285 lb (129.3 kg)  05/20/17 281 lb (127.5 kg)    General: Appears her stated age, obese in NAD. Skin: Warm, dry and intact. Round lesion with scaly borders noted over mass at 4 o'clock in left breast. Appears to be like peeling skin. Mass the size of a pea, hard. She has noted lymphadenopathy in the left axilla, lymph nodes are hard. HEENT: Head: normal shape and size; Eyes: sclera white, no  icterus, conjunctiva pink, PERRLA and EOMs intact; Ears: Tm's gray and intact, normal light reflex; Throat/Mouth: Teeth present, mucosa pink and moist, no exudate, lesions or ulcerations noted.  Neck:  Neck supple, trachea midline. No masses, lumps present.  Cardiovascular: Normal rate and rhythm. S1,S2 noted.  No murmur, rubs or gallops noted. 2+ non pitting BLE edema. No carotid bruits noted. Pulmonary/Chest: Normal effort and positive vesicular breath sounds. No respiratory distress. No wheezes, rales or ronchi noted.  Abdomen: Soft and nontender. Normal bowel sounds. No distention or masses noted. Liver, spleen and kidneys non palpable. Musculoskeletal: Strength 5/5 BUE/BLE. No difficulty with gait.  Neurological: Alert and oriented. Cranial nerves II-XII grossly  intact. Coordination normal.  Psychiatric: Mood and affect normal. Behavior is normal. Judgment and thought content normal.    BMET    Component Value Date/Time   NA 139 04/29/2017 1123   K 4.6 04/29/2017 1123   CL 104 04/29/2017 1123   CO2 30 04/29/2017 1123   GLUCOSE 102 (H) 04/29/2017 1123   BUN 13 04/29/2017 1123   CREATININE 0.70 04/29/2017 1123   CALCIUM 10.1 04/29/2017 1123    Lipid Panel  No results found for: CHOL, TRIG, HDL, CHOLHDL, VLDL, LDLCALC  CBC    Component Value Date/Time   WBC 9.8 04/29/2017 1123   RBC 5.26 (H) 04/29/2017 1123   HGB 16.6 (H) 04/29/2017 1123   HCT 48.5 (H) 04/29/2017 1123   PLT 275.0 04/29/2017 1123   MCV 92.2 04/29/2017 1123   MCHC 34.1 04/29/2017 1123   RDW 13.4 04/29/2017 1123    Hgb A1C No results found for: HGBA1C          Assessment & Plan:   Preventative Health Maintenance:  She declines flu shot today Tdap today Pap smear done today, she declines STD screening Diagnostic mammogram and ultrasound ordered, see Rosaria Ferries on your way out to schedule Encouraged her to consume a balanced diet and exercise regimen Advised her to see an eye doctor and dentist  annually Will check CBC, CMET, Lipid, TSH, Free T4, A1C, Vit D and HIV today  Mass of Left Breast, Axillary Lymphadenopathy:  Ordered diagnostic mammogram and ultrasound of left breast today  Snoring, Excessive Daytime Sleepiness, Sleep Disturbance:  Referral to pulmonology for evaluation of possible sleep apnea  RTC in 3 weeks for BP check BAITY, REGINA, NP

## 2017-11-23 NOTE — Patient Instructions (Signed)
Health Maintenance for Postmenopausal Women Menopause is a normal process in which your reproductive ability comes to an end. This process happens gradually over a span of months to years, usually between the ages of 22 and 9. Menopause is complete when you have missed 12 consecutive menstrual periods. It is important to talk with your health care provider about some of the most common conditions that affect postmenopausal women, such as heart disease, cancer, and bone loss (osteoporosis). Adopting a healthy lifestyle and getting preventive care can help to promote your health and wellness. Those actions can also lower your chances of developing some of these common conditions. What should I know about menopause? During menopause, you may experience a number of symptoms, such as:  Moderate-to-severe hot flashes.  Night sweats.  Decrease in sex drive.  Mood swings.  Headaches.  Tiredness.  Irritability.  Memory problems.  Insomnia.  Choosing to treat or not to treat menopausal changes is an individual decision that you make with your health care provider. What should I know about hormone replacement therapy and supplements? Hormone therapy products are effective for treating symptoms that are associated with menopause, such as hot flashes and night sweats. Hormone replacement carries certain risks, especially as you become older. If you are thinking about using estrogen or estrogen with progestin treatments, discuss the benefits and risks with your health care provider. What should I know about heart disease and stroke? Heart disease, heart attack, and stroke become more likely as you age. This may be due, in part, to the hormonal changes that your body experiences during menopause. These can affect how your body processes dietary fats, triglycerides, and cholesterol. Heart attack and stroke are both medical emergencies. There are many things that you can do to help prevent heart disease  and stroke:  Have your blood pressure checked at least every 1-2 years. High blood pressure causes heart disease and increases the risk of stroke.  If you are 53-22 years old, ask your health care provider if you should take aspirin to prevent a heart attack or a stroke.  Do not use any tobacco products, including cigarettes, chewing tobacco, or electronic cigarettes. If you need help quitting, ask your health care provider.  It is important to eat a healthy diet and maintain a healthy weight. ? Be sure to include plenty of vegetables, fruits, low-fat dairy products, and lean protein. ? Avoid eating foods that are high in solid fats, added sugars, or salt (sodium).  Get regular exercise. This is one of the most important things that you can do for your health. ? Try to exercise for at least 150 minutes each week. The type of exercise that you do should increase your heart rate and make you sweat. This is known as moderate-intensity exercise. ? Try to do strengthening exercises at least twice each week. Do these in addition to the moderate-intensity exercise.  Know your numbers.Ask your health care provider to check your cholesterol and your blood glucose. Continue to have your blood tested as directed by your health care provider.  What should I know about cancer screening? There are several types of cancer. Take the following steps to reduce your risk and to catch any cancer development as early as possible. Breast Cancer  Practice breast self-awareness. ? This means understanding how your breasts normally appear and feel. ? It also means doing regular breast self-exams. Let your health care provider know about any changes, no matter how small.  If you are 40  or older, have a clinician do a breast exam (clinical breast exam or CBE) every year. Depending on your age, family history, and medical history, it may be recommended that you also have a yearly breast X-ray (mammogram).  If you  have a family history of breast cancer, talk with your health care provider about genetic screening.  If you are at high risk for breast cancer, talk with your health care provider about having an MRI and a mammogram every year.  Breast cancer (BRCA) gene test is recommended for women who have family members with BRCA-related cancers. Results of the assessment will determine the need for genetic counseling and BRCA1 and for BRCA2 testing. BRCA-related cancers include these types: ? Breast. This occurs in males or females. ? Ovarian. ? Tubal. This may also be called fallopian tube cancer. ? Cancer of the abdominal or pelvic lining (peritoneal cancer). ? Prostate. ? Pancreatic.  Cervical, Uterine, and Ovarian Cancer Your health care provider may recommend that you be screened regularly for cancer of the pelvic organs. These include your ovaries, uterus, and vagina. This screening involves a pelvic exam, which includes checking for microscopic changes to the surface of your cervix (Pap test).  For women ages 21-65, health care providers may recommend a pelvic exam and a Pap test every three years. For women ages 79-65, they may recommend the Pap test and pelvic exam, combined with testing for human papilloma virus (HPV), every five years. Some types of HPV increase your risk of cervical cancer. Testing for HPV may also be done on women of any age who have unclear Pap test results.  Other health care providers may not recommend any screening for nonpregnant women who are considered low risk for pelvic cancer and have no symptoms. Ask your health care provider if a screening pelvic exam is right for you.  If you have had past treatment for cervical cancer or a condition that could lead to cancer, you need Pap tests and screening for cancer for at least 20 years after your treatment. If Pap tests have been discontinued for you, your risk factors (such as having a new sexual partner) need to be  reassessed to determine if you should start having screenings again. Some women have medical problems that increase the chance of getting cervical cancer. In these cases, your health care provider may recommend that you have screening and Pap tests more often.  If you have a family history of uterine cancer or ovarian cancer, talk with your health care provider about genetic screening.  If you have vaginal bleeding after reaching menopause, tell your health care provider.  There are currently no reliable tests available to screen for ovarian cancer.  Lung Cancer Lung cancer screening is recommended for adults 69-62 years old who are at high risk for lung cancer because of a history of smoking. A yearly low-dose CT scan of the lungs is recommended if you:  Currently smoke.  Have a history of at least 30 pack-years of smoking and you currently smoke or have quit within the past 15 years. A pack-year is smoking an average of one pack of cigarettes per day for one year.  Yearly screening should:  Continue until it has been 15 years since you quit.  Stop if you develop a health problem that would prevent you from having lung cancer treatment.  Colorectal Cancer  This type of cancer can be detected and can often be prevented.  Routine colorectal cancer screening usually begins at  age 42 and continues through age 45.  If you have risk factors for colon cancer, your health care provider may recommend that you be screened at an earlier age.  If you have a family history of colorectal cancer, talk with your health care provider about genetic screening.  Your health care provider may also recommend using home test kits to check for hidden blood in your stool.  A small camera at the end of a tube can be used to examine your colon directly (sigmoidoscopy or colonoscopy). This is done to check for the earliest forms of colorectal cancer.  Direct examination of the colon should be repeated every  5-10 years until age 71. However, if early forms of precancerous polyps or small growths are found or if you have a family history or genetic risk for colorectal cancer, you may need to be screened more often.  Skin Cancer  Check your skin from head to toe regularly.  Monitor any moles. Be sure to tell your health care provider: ? About any new moles or changes in moles, especially if there is a change in a mole's shape or color. ? If you have a mole that is larger than the size of a pencil eraser.  If any of your family members has a history of skin cancer, especially at a young age, talk with your health care provider about genetic screening.  Always use sunscreen. Apply sunscreen liberally and repeatedly throughout the day.  Whenever you are outside, protect yourself by wearing long sleeves, pants, a wide-brimmed hat, and sunglasses.  What should I know about osteoporosis? Osteoporosis is a condition in which bone destruction happens more quickly than new bone creation. After menopause, you may be at an increased risk for osteoporosis. To help prevent osteoporosis or the bone fractures that can happen because of osteoporosis, the following is recommended:  If you are 46-71 years old, get at least 1,000 mg of calcium and at least 600 mg of vitamin D per day.  If you are older than age 55 but younger than age 65, get at least 1,200 mg of calcium and at least 600 mg of vitamin D per day.  If you are older than age 54, get at least 1,200 mg of calcium and at least 800 mg of vitamin D per day.  Smoking and excessive alcohol intake increase the risk of osteoporosis. Eat foods that are rich in calcium and vitamin D, and do weight-bearing exercises several times each week as directed by your health care provider. What should I know about how menopause affects my mental health? Depression may occur at any age, but it is more common as you become older. Common symptoms of depression  include:  Low or sad mood.  Changes in sleep patterns.  Changes in appetite or eating patterns.  Feeling an overall lack of motivation or enjoyment of activities that you previously enjoyed.  Frequent crying spells.  Talk with your health care provider if you think that you are experiencing depression. What should I know about immunizations? It is important that you get and maintain your immunizations. These include:  Tetanus, diphtheria, and pertussis (Tdap) booster vaccine.  Influenza every year before the flu season begins.  Pneumonia vaccine.  Shingles vaccine.  Your health care provider may also recommend other immunizations. This information is not intended to replace advice given to you by your health care provider. Make sure you discuss any questions you have with your health care provider. Document Released: 11/27/2005  Document Revised: 04/24/2016 Document Reviewed: 07/09/2015 Elsevier Interactive Patient Education  2018 Elsevier Inc.  

## 2017-11-24 ENCOUNTER — Ambulatory Visit
Admission: RE | Admit: 2017-11-24 | Discharge: 2017-11-24 | Disposition: A | Discharge status: Home / Self Care | Payer: BLUE CROSS/BLUE SHIELD | Source: Ambulatory Visit | Attending: Internal Medicine | Admitting: Internal Medicine

## 2017-11-24 ENCOUNTER — Other Ambulatory Visit: Payer: Self-pay | Admitting: Internal Medicine

## 2017-11-24 ENCOUNTER — Encounter: Payer: Self-pay | Admitting: Internal Medicine

## 2017-11-24 DIAGNOSIS — R590 Localized enlarged lymph nodes: Secondary | ICD-10-CM

## 2017-11-24 DIAGNOSIS — E559 Vitamin D deficiency, unspecified: Secondary | ICD-10-CM

## 2017-11-24 DIAGNOSIS — N6320 Unspecified lump in the left breast, unspecified quadrant: Secondary | ICD-10-CM

## 2017-11-24 LAB — CYTOLOGY - PAP
Diagnosis: NEGATIVE
HPV: NOT DETECTED

## 2017-11-24 LAB — HIV ANTIBODY (ROUTINE TESTING W REFLEX): HIV: NONREACTIVE

## 2017-11-24 MED ORDER — VITAMIN D (ERGOCALCIFEROL) 1.25 MG (50000 UNIT) PO CAPS: 50000.0000 [IU] | ORAL_CAPSULE | ORAL | 0 refills | Status: DC

## 2017-11-25 MED ORDER — LEVOTHYROXINE SODIUM 100 MCG PO TABS: 100.0000 ug | ORAL_TABLET | Freq: Every day | ORAL | 3 refills | Status: DC

## 2017-11-25 NOTE — Addendum Note (Signed)
Addended by: Lurlean Nanny on: 11/25/2017 09:58 AM   Modules accepted: Orders

## 2017-11-29 MED ORDER — AMLODIPINE BESYLATE 10 MG PO TABS: 10.0000 mg | ORAL_TABLET | Freq: Every day | ORAL | 0 refills | Status: DC

## 2017-11-29 NOTE — Telephone Encounter (Signed)
PLEASE NOTE: All timestamps contained within this report are represented as [place] Standard Time. CONFIDENTIALTY NOTICE: This fax transmission is intended only for the addressee. It contains information that is legally privileged, confidential or otherwise protected from use or disclosure. If you are not the intended recipient, you are strictly prohibited from reviewing, disclosing, copying using or disseminating any of this information or taking any action in reliance on or regarding this information. If you have received this fax in error, please notify us immediately by telephone so that we can arrange for its return to [place]. Phone: [phone], Toll-Free: [phone], Fax: [phone] Page: 1 of 1 Call Id: [id number] [place] Patient Name: Donna Whitaker Gender: Female DOB: [birth year] Age: 47 Y 24 D Return Phone Number: [phone] (Primary), [phone] (Secondary) Address[place] [address] [place] - Client Client Site [facility] [facility] Webb Silversmith - NP Contact Type Call Who Is Calling Patient / Member / Family / Caregiver Call Type Triage / Clinical Relationship To Patient Self Return Phone Number [phone] (Primary) Chief Complaint Prescription Refill or Medication Request (non symptomatic) Reason for Call Medication Question / Request Initial Comment Caller's BP Rx has not been called in yet & she will be out of it tomorrow Amlodipine 10mg  1x daily Translation No Nurse Assessment Nurse: Lavera Guise, RN, Vaughan Basta Date/Time (Eastern Time): 11/28/2017 2:44:06 PM Confirm and document reason for call. If symptomatic, describe symptoms. ---Caller states BP Rx has not been called in yet and she will be out for her 4 a.m. dosage tomorrow morning. Nurse triage not indicated. Short term prescription  will be sent to pharmacy to tide patient over until she can reach the office tomorrow.Amlodipine 10mg  1x daily. Does the patient have any new or worsening symptoms? ---No Please document clinical information provided and list any resource used. ---Nurse triage not indicated. Short term prescription will be sent to pharmacy to tide patient over until she can reach the office tomorrow Guidelines Guideline Title Affirmed Question Affirmed Notes Nurse Date/Time ([place] Time) Disp. Time Eilene Ghazi Time) Disposition Final User 11/28/2017 2:15:36 PM Attempt made - message left Lavera Guise, RNVaughan Basta 11/28/2017 2:46:31 PM Clinical Call Yes Lavera Guise, RN, Vaughan Basta Comments User: Ricard Dillon, RN Date/Time Eilene Ghazi Time): 11/28/2017 2:46:21 PM Explained to patient that script would be called for 2 pills and she would need to call office tomorrow for full refill.

## 2017-11-29 NOTE — Addendum Note (Signed)
Addended by: Lurlean Nanny on: 11/29/2017 09:39 AM   Modules accepted: Orders

## 2017-12-08 ENCOUNTER — Other Ambulatory Visit: Payer: Self-pay | Admitting: Internal Medicine

## 2017-12-14 ENCOUNTER — Ambulatory Visit: Payer: BLUE CROSS/BLUE SHIELD | Admitting: Internal Medicine

## 2017-12-14 ENCOUNTER — Encounter: Payer: Self-pay | Admitting: Internal Medicine

## 2017-12-14 VITALS — BP 126/82 | HR 80 | Temp 98.2°F | Wt 307.0 lb

## 2017-12-14 DIAGNOSIS — I10 Essential (primary) hypertension: Secondary | ICD-10-CM

## 2017-12-14 LAB — BASIC METABOLIC PANEL
BUN: 14 mg/dL (ref 6–23)
CHLORIDE: 100 meq/L (ref 96–112)
CO2: 33 mEq/L — ABNORMAL HIGH (ref 19–32)
Calcium: 9.9 mg/dL (ref 8.4–10.5)
Creatinine, Ser: 0.74 mg/dL (ref 0.40–1.20)
GFR: 88.62 mL/min (ref >60.00)
GLUCOSE: 89 mg/dL (ref 70–99)
Potassium: 4.6 mEq/L (ref 3.5–5.1)
SODIUM: 139 meq/L (ref 135–145)

## 2017-12-14 MED ORDER — HYDROCHLOROTHIAZIDE 25 MG PO TABS: 25.0000 mg | ORAL_TABLET | Freq: Every day | ORAL | 3 refills | Status: DC

## 2017-12-14 MED ORDER — AMLODIPINE BESYLATE 10 MG PO TABS: 10.0000 mg | ORAL_TABLET | Freq: Every day | ORAL | 3 refills | Status: DC

## 2017-12-14 NOTE — Assessment & Plan Note (Signed)
Controlled Continue DASH diet and exercise for weight loss BMET today Continue Amlodipine and HCTZ, refilled today  RTC in 1 year for your annual exam

## 2017-12-14 NOTE — Progress Notes (Signed)
Subjective:    Patient ID: Donna Whitaker, female    DOB: 1971/05/30, 47 y.o.   MRN: [id number]  HPI  Pt presents to the clinic today for 3 week follow up of HTN. At her last visit, she was started on HCTZ in addition to her Amlodipine. She has been taking both medications as prescribed. She does note some swelling since starting the Amlodipine, but that has improved since starting the HCTZ. Her BP today is 126/82. There is no ECG on file.  Review of Systems      Past Medical History:  Diagnosis Date  . Allergy   . History of chicken pox     Current Outpatient Medications  Medication Sig Dispense Refill  . amLODipine (NORVASC) 10 MG tablet Take 1 tablet (10 mg total) by mouth daily. 30 tablet 0  . Aspirin-Caffeine (BC FAST PAIN RELIEF ARTHRITIS PO) Take 1 tablet by mouth daily.    . hydrochlorothiazide (HYDRODIURIL) 25 MG tablet Take 1 tablet (25 mg total) by mouth daily. 30 tablet 0  . ibuprofen (ADVIL,MOTRIN) 200 MG tablet Take 800 mg by mouth daily as needed.    Marland Kitchen levothyroxine (SYNTHROID, LEVOTHROID) 100 MCG tablet Take 1 tablet (100 mcg total) by mouth daily. 30 tablet 3  . levothyroxine (SYNTHROID, LEVOTHROID) 50 MCG tablet TAKE 2 TABLETS (100 MCG) BY MOUTH EVERY DAY 60 tablet 1  . Sertaconazole Nitrate 2 % CREA Apply 1 application topically 2 (two) times daily. 30 g 0  . Vitamin D, Ergocalciferol, (DRISDOL) 50000 units CAPS capsule Take 1 capsule (50,000 Units total) by mouth every 7 (seven) days. 12 capsule 0   No current facility-administered medications for this visit.     Allergies  Allergen Reactions  . Betadine [Povidone Iodine] Other (See Comments)    Skin irritation  . Vicodin [Hydrocodone-Acetaminophen] Palpitations    Chest pain    Family History  Problem Relation Age of Onset  . Arthritis Mother   . Hyperlipidemia Mother   . Alcohol abuse Father   . Hyperlipidemia Father   . Heart disease Father   . Hypertension Father   . Arthritis Maternal  Grandmother   . Hyperlipidemia Maternal Grandmother   . Heart disease Maternal Grandmother   . Hypertension Maternal Grandmother   . Diabetes Maternal Grandmother   . Hyperlipidemia Maternal Grandfather   . Hypertension Maternal Grandfather   . Hyperlipidemia Paternal Grandmother   . Hypertension Paternal Grandmother   . Stomach cancer Paternal Grandmother   . Alcohol abuse Paternal Grandfather   . Hyperlipidemia Paternal Grandfather   . Heart disease Paternal Grandfather   . Hypertension Paternal Grandfather     Social History   Socioeconomic History  . Marital status: Married    Spouse name: Not on file  . Number of children: Not on file  . Years of education: Not on file  . Highest education level: Not on file  Social Needs  . Financial resource strain: Not on file  . Food insecurity - worry: Not on file  . Food insecurity - inability: Not on file  . Transportation needs - medical: Not on file  . Transportation needs - non-medical: Not on file  Occupational History  . Not on file  Tobacco Use  . Smoking status: Current Every Day Smoker    Packs/day: 1.50    Years: 35.00    Pack years: 52.50    Types: Cigarettes  . Smokeless tobacco: Never Used  Substance and Sexual Activity  . Alcohol use:  Yes    Comment: occasional  . Drug use: No  . Sexual activity: Yes  Other Topics Concern  . Not on file  Social History Narrative  . Not on file     Constitutional: Denies fever, malaise, fatigue, headache or abrupt weight changes.  Respiratory: Denies difficulty breathing, shortness of breath, cough or sputum production.   Cardiovascular: Denies chest pain, chest tightness, palpitations or swelling in the hands or feet.  Neurological: Denies dizziness, difficulty with memory, difficulty with speech or problems with balance and coordination.    No other specific complaints in a complete review of systems (except as listed in HPI above).  Objective:   Physical  Exam   BP 126/82   Pulse 80   Temp 98.2 F (36.8 C) (Oral)   Wt (!) 307 lb (139.3 kg)   LMP 05/19/2016   SpO2 98%   BMI 46.68 kg/m  Wt Readings from Last 3 Encounters:  12/14/17 (!) 307 lb (139.3 kg)  11/23/17 (!) 306 lb 8 oz (139 kg)  06/17/17 285 lb (129.3 kg)    General: Appears her stated age,obese in NAD. Cardiovascular: Normal rate and rhythm. S1,S2 noted.  No murmur, rubs or gallops noted. No JVD or BLE edema.  Pulmonary/Chest: Normal effort and positive vesicular breath sounds. No respiratory distress. No wheezes, rales or ronchi noted.  Neurological: Alert and oriented.     BMET    Component Value Date/Time   NA 141 11/23/2017 0918   K 4.7 11/23/2017 0918   CL 103 11/23/2017 0918   CO2 31 11/23/2017 0918   GLUCOSE 98 11/23/2017 0918   BUN 16 11/23/2017 0918   CREATININE 0.64 11/23/2017 0918   CALCIUM 9.4 11/23/2017 0918    Lipid Panel     Component Value Date/Time   CHOL 164 11/23/2017 0918   TRIG 85.0 11/23/2017 0918   HDL 45.80 11/23/2017 0918   CHOLHDL 4 11/23/2017 0918   VLDL 17.0 11/23/2017 0918   LDLCALC 101 (H) 11/23/2017 0918    CBC    Component Value Date/Time   WBC 7.3 11/23/2017 0918   RBC 5.06 11/23/2017 0918   HGB 15.8 (H) 11/23/2017 0918   HCT 46.4 (H) 11/23/2017 0918   PLT 256.0 11/23/2017 0918   MCV 91.7 11/23/2017 0918   MCHC 34.0 11/23/2017 0918   RDW 13.4 11/23/2017 0918    Hgb A1C Lab Results  Component Value Date   HGBA1C 6.2 11/23/2017           Assessment & Plan:

## 2017-12-14 NOTE — Patient Instructions (Signed)

## 2017-12-17 ENCOUNTER — Other Ambulatory Visit: Payer: Self-pay

## 2017-12-17 MED ORDER — LEVOTHYROXINE SODIUM 100 MCG PO TABS: 100.0000 ug | ORAL_TABLET | Freq: Every day | ORAL | 1 refills | Status: DC

## 2018-01-25 ENCOUNTER — Telehealth: Payer: Self-pay | Admitting: *Deleted

## 2018-01-25 NOTE — Telephone Encounter (Signed)
Left detailed msg on VM per HIPAA  

## 2018-01-25 NOTE — Telephone Encounter (Signed)
I would advise her to make an appt to discuss. It's likely the Amlodipine and not the diuretic.

## 2018-01-25 NOTE — Telephone Encounter (Signed)
Spoke to patient and was advised that she is having more swelling of her feet and ankles since starting the diuretic. Patient stated that the swelling in her feet and ankles use to come and go, but for the last two weeks have been constant. Patient stated that when she goes to bed the swelling goes down and then when she gets up they start swelling again. Patient stated that even the top of her feet are swelling now. Patient stated that she has cut back on her sodas and is drinking more water and cranberry juice now. Patient stated that she does not sleep well at night, so is not in the bed more that 4 hours at night. Patient stated that she is up moving around and doing some exercising to help reduce the swelling and keeps her feet elevated when sitting around. Patient sent a my chart message to schedule an appointments and wants to know if she needs to come back in or does her medication just need to be changed? Patient stated that she is not having any shortness of breath or any other symptoms.

## 2018-01-27 ENCOUNTER — Ambulatory Visit: Payer: BLUE CROSS/BLUE SHIELD | Admitting: Internal Medicine

## 2018-01-27 ENCOUNTER — Encounter: Payer: Self-pay | Admitting: Internal Medicine

## 2018-01-27 VITALS — BP 136/78 | HR 73 | Temp 98.3°F | Wt 314.0 lb

## 2018-01-27 DIAGNOSIS — R609 Edema, unspecified: Secondary | ICD-10-CM

## 2018-01-27 DIAGNOSIS — I10 Essential (primary) hypertension: Secondary | ICD-10-CM

## 2018-01-27 MED ORDER — LOSARTAN POTASSIUM 50 MG PO TABS: 50.0000 mg | ORAL_TABLET | Freq: Every day | ORAL | 3 refills | Status: DC

## 2018-01-27 NOTE — Progress Notes (Signed)
Subjective:    Patient ID: Donna Whitaker, female    DOB: 03/01/1971, 47 y.o.   MRN: [id number]  HPI  Pt presents to the clinic today with c/o BLE swelling. She first noticed this after starting the Amlodipine for HTN. Her BP was still slightly elevated so she was started on HCTZ. Initially, the swelling improved after starting the HCTZ, but has since worsened again. She reports it is worse in the evening and better in the mornings. She has elevated her legs with some improvement. She denies cough or shortness of breath. She has no known heart failure. Her BP today is 136/78.  Review of Systems      Past Medical History:  Diagnosis Date  . Allergy   . History of chicken pox     Current Outpatient Medications  Medication Sig Dispense Refill  . amLODipine (NORVASC) 10 MG tablet Take 1 tablet (10 mg total) by mouth daily. 90 tablet 3  . Aspirin-Caffeine (BC FAST PAIN RELIEF ARTHRITIS PO) Take 1 tablet by mouth daily.    . hydrochlorothiazide (HYDRODIURIL) 25 MG tablet Take 1 tablet (25 mg total) by mouth daily. 90 tablet 3  . ibuprofen (ADVIL,MOTRIN) 200 MG tablet Take 800 mg by mouth daily as needed.    Marland Kitchen levothyroxine (SYNTHROID, LEVOTHROID) 100 MCG tablet Take 1 tablet (100 mcg total) by mouth daily. 90 tablet 1  . levothyroxine (SYNTHROID, LEVOTHROID) 50 MCG tablet TAKE 2 TABLETS (100 MCG) BY MOUTH EVERY DAY 60 tablet 1  . Sertaconazole Nitrate 2 % CREA Apply 1 application topically 2 (two) times daily. 30 g 0  . Vitamin D, Ergocalciferol, (DRISDOL) 50000 units CAPS capsule Take 1 capsule (50,000 Units total) by mouth every 7 (seven) days. 12 capsule 0   No current facility-administered medications for this visit.     Allergies  Allergen Reactions  . Betadine [Povidone Iodine] Other (See Comments)    Skin irritation  . Vicodin [Hydrocodone-Acetaminophen] Palpitations    Chest pain    Family History  Problem Relation Age of Onset  . Arthritis Mother   . Hyperlipidemia  Mother   . Alcohol abuse Father   . Hyperlipidemia Father   . Heart disease Father   . Hypertension Father   . Arthritis Maternal Grandmother   . Hyperlipidemia Maternal Grandmother   . Heart disease Maternal Grandmother   . Hypertension Maternal Grandmother   . Diabetes Maternal Grandmother   . Hyperlipidemia Maternal Grandfather   . Hypertension Maternal Grandfather   . Hyperlipidemia Paternal Grandmother   . Hypertension Paternal Grandmother   . Stomach cancer Paternal Grandmother   . Alcohol abuse Paternal Grandfather   . Hyperlipidemia Paternal Grandfather   . Heart disease Paternal Grandfather   . Hypertension Paternal Grandfather     Social History   Socioeconomic History  . Marital status: Married    Spouse name: Not on file  . Number of children: Not on file  . Years of education: Not on file  . Highest education level: Not on file  Occupational History  . Not on file  Social Needs  . Financial resource strain: Not on file  . Food insecurity:    Worry: Not on file    Inability: Not on file  . Transportation needs:    Medical: Not on file    Non-medical: Not on file  Tobacco Use  . Smoking status: Current Every Day Smoker    Packs/day: 1.50    Years: 35.00    Pack years:  52.50    Types: Cigarettes  . Smokeless tobacco: Never Used  Substance and Sexual Activity  . Alcohol use: Yes    Comment: occasional  . Drug use: No  . Sexual activity: Yes  Lifestyle  . Physical activity:    Days per week: Not on file    Minutes per session: Not on file  . Stress: Not on file  Relationships  . Social connections:    Talks on phone: Not on file    Gets together: Not on file    Attends religious service: Not on file    Active member of club or organization: Not on file    Attends meetings of clubs or organizations: Not on file    Relationship status: Not on file  . Intimate partner violence:    Fear of current or ex partner: Not on file    Emotionally abused:  Not on file    Physically abused: Not on file    Forced sexual activity: Not on file  Other Topics Concern  . Not on file  Social History Narrative  . Not on file     Constitutional: Denies fever, malaise, fatigue, headache or abrupt weight changes.  Respiratory: Denies difficulty breathing, shortness of breath, cough or sputum production.   Cardiovascular: Pt reports swelling BLE. Denies chest pain, chest tightness, palpitations or swelling in the hands.   No other specific complaints in a complete review of systems (except as listed in HPI above).  Objective:   Physical Exam   BP 136/78   Pulse 73   Temp 98.3 F (36.8 C) (Oral)   Wt (!) 314 lb (142.4 kg)   LMP 05/19/2016   SpO2 95%   BMI 47.74 kg/m  Wt Readings from Last 3 Encounters:  01/27/18 (!) 314 lb (142.4 kg)  12/14/17 (!) 307 lb (139.3 kg)  11/23/17 (!) 306 lb 8 oz (139 kg)    General: Appears her stated age, obese in NAD. Skin: Warm, dry and intact. No rashes, lesions or ulcerations noted. Cardiovascular: Normal rate and rhythm. S1,S2 noted.  No murmur, rubs or gallops noted. 2+ non pitting BLE edema.  Pulmonary/Chest: Normal effort and positive vesicular breath sounds. No respiratory distress. No wheezes, rales or ronchi noted.   BMET    Component Value Date/Time   NA 139 12/14/2017 0911   K 4.6 12/14/2017 0911   CL 100 12/14/2017 0911   CO2 33 (H) 12/14/2017 0911   GLUCOSE 89 12/14/2017 0911   BUN 14 12/14/2017 0911   CREATININE 0.74 12/14/2017 0911   CALCIUM 9.9 12/14/2017 0911    Lipid Panel     Component Value Date/Time   CHOL 164 11/23/2017 0918   TRIG 85.0 11/23/2017 0918   HDL 45.80 11/23/2017 0918   CHOLHDL 4 11/23/2017 0918   VLDL 17.0 11/23/2017 0918   LDLCALC 101 (H) 11/23/2017 0918    CBC    Component Value Date/Time   WBC 7.3 11/23/2017 0918   RBC 5.06 11/23/2017 0918   HGB 15.8 (H) 11/23/2017 0918   HCT 46.4 (H) 11/23/2017 0918   PLT 256.0 11/23/2017 0918   MCV 91.7  11/23/2017 0918   MCHC 34.0 11/23/2017 0918   RDW 13.4 11/23/2017 0918    Hgb A1C Lab Results  Component Value Date   HGBA1C 6.2 11/23/2017           Assessment & Plan:   BLE Edema:  Will stop Amlodipine Continue HCTZ for now, will check BMET at next  appt If no improvement, consider BNP, 2D Echo and TEDS eRx for Losartan 50 mg daily Encouraged elevation Discussed DASH diet and exercise for weight loss  RTC in 2 weeks for follow up HTN/Vit D Webb Silversmith, NP

## 2018-01-27 NOTE — Patient Instructions (Signed)
Edema Edema is when you have too much fluid in your body or under your skin. Edema may make your legs, feet, and ankles swell up. Swelling is also common in looser tissues, like around your eyes. This is a common condition. It gets more common as you get older. There are many possible causes of edema. Eating too much salt (sodium) and being on your feet or sitting for a long time can cause edema in your legs, feet, and ankles. Hot weather may make edema worse. Edema is usually painless. Your skin may look swollen or shiny. Follow these instructions at home:  Keep the swollen body part raised (elevated) above the level of your heart when you are sitting or lying down.  Do not sit still or stand for a long time.  Do not wear tight clothes. Do not wear garters on your upper legs.  Exercise your legs. This can help the swelling go down.  Wear elastic bandages or support stockings as told by your doctor.  Eat a low-salt (low-sodium) diet to reduce fluid as told by your doctor.  Depending on the cause of your swelling, you may need to limit how much fluid you drink (fluid restriction).  Take over-the-counter and prescription medicines only as told by your doctor. Contact a doctor if:  Treatment is not working.  You have heart, liver, or kidney disease and have symptoms of edema.  You have sudden and unexplained weight gain. Get help right away if:  You have shortness of breath or chest pain.  You cannot breathe when you lie down.  You have pain, redness, or warmth in the swollen areas.  You have heart, liver, or kidney disease and get edema all of a sudden.  You have a fever and your symptoms get worse all of a sudden. Summary  Edema is when you have too much fluid in your body or under your skin.  Edema may make your legs, feet, and ankles swell up. Swelling is also common in looser tissues, like around your eyes.  Raise (elevate) the swollen body part above the level of your  heart when you are sitting or lying down.  Follow your doctor's instructions about diet and how much fluid you can drink (fluid restriction). This information is not intended to replace advice given to you by your health care provider. Make sure you discuss any questions you have with your health care provider. Document Released: 03/23/2008 Document Revised: 10/23/2016 Document Reviewed: 10/23/2016 Elsevier Interactive Patient Education  2017 [employer]  

## 2018-02-08 ENCOUNTER — Other Ambulatory Visit: Payer: Self-pay | Admitting: Internal Medicine

## 2018-02-10 ENCOUNTER — Encounter: Payer: Self-pay | Admitting: Internal Medicine

## 2018-02-10 ENCOUNTER — Ambulatory Visit: Payer: BLUE CROSS/BLUE SHIELD | Admitting: Internal Medicine

## 2018-02-10 DIAGNOSIS — R609 Edema, unspecified: Secondary | ICD-10-CM

## 2018-02-10 DIAGNOSIS — I10 Essential (primary) hypertension: Secondary | ICD-10-CM | POA: Diagnosis not present

## 2018-02-10 DIAGNOSIS — J0100 Acute maxillary sinusitis, unspecified: Secondary | ICD-10-CM | POA: Diagnosis not present

## 2018-02-10 DIAGNOSIS — J209 Acute bronchitis, unspecified: Secondary | ICD-10-CM | POA: Diagnosis not present

## 2018-02-10 DIAGNOSIS — E559 Vitamin D deficiency, unspecified: Secondary | ICD-10-CM | POA: Diagnosis not present

## 2018-02-10 LAB — BASIC METABOLIC PANEL
BUN: 15 mg/dL (ref 6–23)
CHLORIDE: 98 meq/L (ref 96–112)
CO2: 33 meq/L — AB (ref 19–32)
Calcium: 9.9 mg/dL (ref 8.4–10.5)
Creatinine, Ser: 0.76 mg/dL (ref 0.40–1.20)
GFR: 85.88 mL/min (ref >60.00)
GLUCOSE: 112 mg/dL — AB (ref 70–99)
POTASSIUM: 4.7 meq/L (ref 3.5–5.1)
Sodium: 137 mEq/L (ref 135–145)

## 2018-02-10 LAB — VITAMIN D 25 HYDROXY (VIT D DEFICIENCY, FRACTURES): VITD: 40.21 ng/mL (ref 30.00–100.00)

## 2018-02-10 LAB — BRAIN NATRIURETIC PEPTIDE: Pro B Natriuretic peptide (BNP): 10 pg/mL (ref 0.0–100.0)

## 2018-02-10 MED ORDER — METHYLPREDNISOLONE ACETATE 80 MG/ML IJ SUSP
80.0000 mg | Freq: Once | INTRAMUSCULAR | Status: AC
  Administered 2018-02-10: 80 mg via INTRAMUSCULAR

## 2018-02-10 MED ORDER — AZITHROMYCIN 250 MG PO TABS: ORAL_TABLET | ORAL | 0 refills | Status: DC

## 2018-02-10 MED ORDER — ALBUTEROL SULFATE HFA 108 (90 BASE) MCG/ACT IN AERS: 2.0000 | INHALATION_SPRAY | Freq: Four times a day (QID) | RESPIRATORY_TRACT | 0 refills | Status: DC | PRN

## 2018-02-10 NOTE — Progress Notes (Signed)
Subjective:    Patient ID: Donna Whitaker, female    DOB: 01/23/1971, 47 y.o.   MRN: [id number]  HPI  Pt presents to the clinic today for 2 week follow up of HTN/edema. She was taken off her Amlodipine and started on Losartan 50 mg daily secondary to edema. She has been taking the medication as prescribed and has noted some improvement in the edema. She is no longer having to elevated her legs, but the edema has not resolved completely. She feels like the HCTZ is the culprit of the swelling. She never had this issue prior to starting the HCTZ. She also reports she is urinating less since starting the HCTZ. She denies chronic cough or shortness of breath. She has no history of heart failure. Her BP today is 128/80.  She also c/o headache, runny nose, nasal congestion, sore throat, cough and chest congestion. She describes the headache as pressure. She is blowing yellow/green mucous out of her nose. She denies difficulty swallowing. The cough is productive of yellow green mucous. She is short of breath with exertion. She denies fever, chills or body aches. She has tried Tourist information centre manager, Mudlogger. She does have a history of seasonal allergies but does not routinely take anything for that. She has had sick contacts. She does smoke.  She is also due to have her Vit D repeated today. She just finished her 12 weeks of Ergocalciferol. She is not taking any Vit D OTC.  Review of Systems      Past Medical History:  Diagnosis Date  . Allergy   . History of chicken pox     Current Outpatient Medications  Medication Sig Dispense Refill  . Aspirin-Caffeine (BC FAST PAIN RELIEF ARTHRITIS PO) Take 1 tablet by mouth daily.    . hydrochlorothiazide (HYDRODIURIL) 25 MG tablet Take 1 tablet (25 mg total) by mouth daily. 90 tablet 3  . ibuprofen (ADVIL,MOTRIN) 200 MG tablet Take 800 mg by mouth daily as needed.    Marland Kitchen levothyroxine (SYNTHROID, LEVOTHROID) 100 MCG tablet Take 1 tablet (100 mcg total)  by mouth daily. 90 tablet 1  . levothyroxine (SYNTHROID, LEVOTHROID) 50 MCG tablet TAKE 2 TABLETS (100 MCG) BY MOUTH EVERY DAY 60 tablet 1  . losartan (COZAAR) 50 MG tablet Take 1 tablet (50 mg total) by mouth daily. 90 tablet 3  . Sertaconazole Nitrate 2 % CREA Apply 1 application topically 2 (two) times daily. 30 g 0  . Vitamin D, Ergocalciferol, (DRISDOL) 50000 units CAPS capsule Take 1 capsule (50,000 Units total) by mouth every 7 (seven) days. (Patient not taking: Reported on 02/10/2018) 12 capsule 0   No current facility-administered medications for this visit.     Allergies  Allergen Reactions  . Betadine [Povidone Iodine] Other (See Comments)    Skin irritation  . Vicodin [Hydrocodone-Acetaminophen] Palpitations    Chest pain    Family History  Problem Relation Age of Onset  . Arthritis Mother   . Hyperlipidemia Mother   . Alcohol abuse Father   . Hyperlipidemia Father   . Heart disease Father   . Hypertension Father   . Arthritis Maternal Grandmother   . Hyperlipidemia Maternal Grandmother   . Heart disease Maternal Grandmother   . Hypertension Maternal Grandmother   . Diabetes Maternal Grandmother   . Hyperlipidemia Maternal Grandfather   . Hypertension Maternal Grandfather   . Hyperlipidemia Paternal Grandmother   . Hypertension Paternal Grandmother   . Stomach cancer Paternal Grandmother   .  Alcohol abuse Paternal Grandfather   . Hyperlipidemia Paternal Grandfather   . Heart disease Paternal Grandfather   . Hypertension Paternal Grandfather     Social History   Socioeconomic History  . Marital status: Married    Spouse name: Not on file  . Number of children: Not on file  . Years of education: Not on file  . Highest education level: Not on file  Occupational History  . Not on file  Social Needs  . Financial resource strain: Not on file  . Food insecurity:    Worry: Not on file    Inability: Not on file  . Transportation needs:    Medical: Not on  file    Non-medical: Not on file  Tobacco Use  . Smoking status: Current Every Day Smoker    Packs/day: 1.50    Years: 35.00    Pack years: 52.50    Types: Cigarettes  . Smokeless tobacco: Never Used  Substance and Sexual Activity  . Alcohol use: Yes    Comment: occasional  . Drug use: No  . Sexual activity: Yes  Lifestyle  . Physical activity:    Days per week: Not on file    Minutes per session: Not on file  . Stress: Not on file  Relationships  . Social connections:    Talks on phone: Not on file    Gets together: Not on file    Attends religious service: Not on file    Active member of club or organization: Not on file    Attends meetings of clubs or organizations: Not on file    Relationship status: Not on file  . Intimate partner violence:    Fear of current or ex partner: Not on file    Emotionally abused: Not on file    Physically abused: Not on file    Forced sexual activity: Not on file  Other Topics Concern  . Not on file  Social History Narrative  . Not on file     Constitutional: Pt reports headache. Denies fever, malaise, fatigue, or abrupt weight changes.  HEENT: Pt reports runny nose, nasal congestion, sore throat. Denies eye pain, eye redness, ear pain, ringing in the ears, wax buildup, bloody noset. Respiratory: Pt reports cough, shortness of breath. Denies difficulty breathing.   Cardiovascular: Pt reports swelling in legs. Denies chest pain, chest tightness, palpitations or swelling in the hands.  Neurological: Denies dizziness, difficulty with memory, difficulty with speech or problems with balance and coordination.    No other specific complaints in a complete review of systems (except as listed in HPI above).  Objective:   Physical Exam  BP 128/80 (BP Location: Right Arm, Patient Position: Sitting, Cuff Size: Large)   Pulse 91   Temp 98.1 F (36.7 C) (Oral)   Ht 5\' 8"  (1.727 m)   Wt (!) 312 lb (141.5 kg)   LMP 05/19/2016   SpO2 93%    BMI 47.44 kg/m  Wt Readings from Last 3 Encounters:  02/10/18 (!) 312 lb (141.5 kg)  01/27/18 (!) 314 lb (142.4 kg)  12/14/17 (!) 307 lb (139.3 kg)    General: Appears her stated age, obese in NAD. HEENT: Head: normal shape and size, no sinus tenderness  noted;  Ears: Tm's gray and intact, normal light reflex, + serous effusion on the left; Nose: mucosa pink and moist, septum midline; Throat/Mouth: Teeth present, mucosa erythematous and moist, + PND, no exudate, lesions or ulcerations noted.  Neck:  No adenopathy  noted Cardiovascular: Normal rate and rhythm. S1,S2 noted.  No murmur, rubs or gallops noted.1+ non pitting BLE edema. Pulmonary/Chest: Normal effort and with coarse rhonchi and bilateral expiratory wheezing noted. No respiratory distress.  Neurological: Alert and oriented.    BMET    Component Value Date/Time   NA 139 12/14/2017 0911   K 4.6 12/14/2017 0911   CL 100 12/14/2017 0911   CO2 33 (H) 12/14/2017 0911   GLUCOSE 89 12/14/2017 0911   BUN 14 12/14/2017 0911   CREATININE 0.74 12/14/2017 0911   CALCIUM 9.9 12/14/2017 0911    Lipid Panel     Component Value Date/Time   CHOL 164 11/23/2017 0918   TRIG 85.0 11/23/2017 0918   HDL 45.80 11/23/2017 0918   CHOLHDL 4 11/23/2017 0918   VLDL 17.0 11/23/2017 0918   LDLCALC 101 (H) 11/23/2017 0918    CBC    Component Value Date/Time   WBC 7.3 11/23/2017 0918   RBC 5.06 11/23/2017 0918   HGB 15.8 (H) 11/23/2017 0918   HCT 46.4 (H) 11/23/2017 0918   PLT 256.0 11/23/2017 0918   MCV 91.7 11/23/2017 0918   MCHC 34.0 11/23/2017 0918   RDW 13.4 11/23/2017 0918    Hgb A1C Lab Results  Component Value Date   HGBA1C 6.2 11/23/2017            Assessment & Plan:   Acute Maxillary Sinusitis/Acute Bronchitis:  80 mg Depo IM today eRx for Azithromax x 5 days eRx for Albuterol inhaler Encouraged smoking cessation Delsym as needed for cough  Peripheral Edema:  Will check CMET and BNP today  If labs  normal, consider stopping HCTZ to see if swelling improves  If swelling persist, consider Lasix in the place of HCTZ  Vit D Deficiency:  Vit D today  Will follow up after labs, return precautions discussed Webb Silversmith, NP

## 2018-02-10 NOTE — Patient Instructions (Signed)

## 2018-02-18 ENCOUNTER — Other Ambulatory Visit: Payer: Self-pay | Admitting: Internal Medicine

## 2018-02-21 ENCOUNTER — Encounter: Payer: Self-pay | Admitting: Internal Medicine

## 2018-09-28 ENCOUNTER — Other Ambulatory Visit: Payer: Self-pay | Admitting: Internal Medicine

## 2018-10-24 ENCOUNTER — Encounter: Payer: Self-pay | Admitting: Internal Medicine

## 2019-01-09 ENCOUNTER — Encounter: Payer: Self-pay | Admitting: Internal Medicine

## 2019-01-09 ENCOUNTER — Other Ambulatory Visit: Payer: Self-pay | Admitting: Internal Medicine

## 2019-01-09 MED ORDER — LEVOTHYROXINE SODIUM 100 MCG PO TABS: 100.0000 ug | ORAL_TABLET | Freq: Every day | ORAL | 0 refills | Status: DC

## 2019-01-09 NOTE — Telephone Encounter (Signed)
Due for follow up chronic conditions. Will need to postpone CPX until June. Is she willing to set up phone visit to follow up chronic conditions. Levothyroxine refilled in the meantime.

## 2019-01-12 NOTE — Telephone Encounter (Signed)
Will take care of on Monday 

## 2019-01-25 ENCOUNTER — Other Ambulatory Visit: Payer: Self-pay | Admitting: Internal Medicine

## 2019-01-26 ENCOUNTER — Encounter: Payer: Self-pay | Admitting: Internal Medicine

## 2019-01-26 ENCOUNTER — Ambulatory Visit (INDEPENDENT_AMBULATORY_CARE_PROVIDER_SITE_OTHER): Payer: BLUE CROSS/BLUE SHIELD | Admitting: Internal Medicine

## 2019-01-26 DIAGNOSIS — I10 Essential (primary) hypertension: Secondary | ICD-10-CM

## 2019-01-26 DIAGNOSIS — F5101 Primary insomnia: Secondary | ICD-10-CM

## 2019-01-26 DIAGNOSIS — R5383 Other fatigue: Secondary | ICD-10-CM | POA: Diagnosis not present

## 2019-01-26 DIAGNOSIS — G4700 Insomnia, unspecified: Secondary | ICD-10-CM | POA: Insufficient documentation

## 2019-01-26 DIAGNOSIS — E039 Hypothyroidism, unspecified: Secondary | ICD-10-CM | POA: Diagnosis not present

## 2019-01-26 NOTE — Progress Notes (Signed)
.  Virtual Visit via Video Note  I connected with Donna Whitaker on 01/26/19 at  8:45 AM EDT by a video enabled telemedicine application and verified that I am speaking with the correct person using two identifiers.   I discussed the limitations of evaluation and management by telemedicine and the availability of in person appointments. The patient expressed understanding and agreed to proceed.  History of Present Illness:  Pt due for follow up of chronic conditions.  HTN: She does not monitor her BP at home. She is taking Losartan as prescribed. She denies adverse side effects. There is no ECG on file.  Hypothyroidism: She reports fatigue, weight gain, hair loss. She is taking Levothyroxine as prescribed. She is due to have her levels repeated today.  Insomnia: She is having trouble falling asleep and staying asleep. She does not feel rested when she wakes up. She does nap during the day. She does snore. She has never taken any prescription sleep aids. She has never had a sleep study.  Observations/Objective:  Alert and oriented x 3 NAD Behavior and thought content are normal.  Assessment and Plan:  See problem based charting  Follow Up Instructions:    I discussed the assessment and treatment plan with the patient. The patient was provided an opportunity to ask questions and all were answered. The patient agreed with the plan and demonstrated an understanding of the instructions.   The patient was advised to call back or seek an in-person evaluation if the symptoms worsen or if the condition fails to improve as anticipated.    Webb Silversmith, NP

## 2019-01-26 NOTE — Assessment & Plan Note (Signed)
?   Component of OSA Start Melatonin 3 mg QHS Consider home sleep study if agreeable, pending labs

## 2019-01-26 NOTE — Assessment & Plan Note (Signed)
Will have her set up nurse visit for BP check and lab visit for CMET Reinforced DASH diet and exercise for weight loss Continue Losartan for now

## 2019-01-26 NOTE — Assessment & Plan Note (Signed)
Will have her set up lab only appt for TSH and Free T4 Will adjust Levothyroxine if needed based on labs

## 2019-01-26 NOTE — Patient Instructions (Signed)

## 2019-04-24 ENCOUNTER — Other Ambulatory Visit: Payer: Self-pay | Admitting: Internal Medicine

## 2019-07-08 ENCOUNTER — Other Ambulatory Visit: Payer: Self-pay | Admitting: Internal Medicine

## 2019-07-18 ENCOUNTER — Other Ambulatory Visit: Payer: Self-pay | Admitting: Internal Medicine

## 2019-08-04 ENCOUNTER — Other Ambulatory Visit: Payer: Self-pay | Admitting: Internal Medicine

## 2019-08-14 ENCOUNTER — Encounter: Payer: Self-pay | Admitting: Internal Medicine

## 2019-09-06 ENCOUNTER — Telehealth: Payer: Self-pay | Admitting: Internal Medicine

## 2019-09-13 NOTE — Telephone Encounter (Signed)
Patient called and scheduled her cpx on 10/27/19.  Patient said she's hoping to get new insurance in January.  Patient has 1 pill left.

## 2019-09-16 ENCOUNTER — Encounter: Payer: Self-pay | Admitting: Internal Medicine

## 2019-09-16 MED ORDER — LEVOTHYROXINE SODIUM 100 MCG PO TABS: 100.0000 ug | ORAL_TABLET | Freq: Every day | ORAL | 1 refills | Status: DC

## 2019-09-27 MED ORDER — LOSARTAN POTASSIUM 50 MG PO TABS: 50.0000 mg | ORAL_TABLET | Freq: Every day | ORAL | 0 refills | Status: DC

## 2019-09-27 NOTE — Addendum Note (Signed)
Addended by: Lurlean Nanny on: 09/27/2019 10:31 AM   Modules accepted: Orders

## 2019-10-08 ENCOUNTER — Other Ambulatory Visit: Payer: Self-pay | Admitting: Internal Medicine

## 2019-10-27 ENCOUNTER — Encounter: Payer: BLUE CROSS/BLUE SHIELD | Admitting: Internal Medicine

## 2019-11-01 ENCOUNTER — Other Ambulatory Visit: Payer: Self-pay | Admitting: Internal Medicine

## 2019-11-29 ENCOUNTER — Other Ambulatory Visit: Payer: Self-pay

## 2019-11-29 ENCOUNTER — Encounter: Payer: 59 | Admitting: Internal Medicine

## 2019-11-29 NOTE — Progress Notes (Deleted)
Subjective:    Patient ID: Donna Whitaker, female    DOB: 07/19/1971, 49 y.o.   MRN: [id number]  HPI  Pt presents to the clinic today for her annual exam. She is also due to follow up chronic conditions.  HTN: Her BP today is. She is taking Losartan as prescribed. There is no ECG on file.   Insomnia: She has trouble. She is not currently taking any medications for sleep at this time. There is no sleep study on file.   Hypothyroidism: She denies any issues on her current dose of Levothyroxine. She does not follow with endocrinology.  Flu: Tetanus: 11/2017 Pap Smear: 11/2017 Mammogram: 11/2017 Colon Screening: Vision Screening: Dentist:  Diet: Exercise:  Review of Systems      Past Medical History:  Diagnosis Date  . Allergy   . History of chicken pox     Current Outpatient Medications  Medication Sig Dispense Refill  . albuterol (PROVENTIL HFA;VENTOLIN HFA) 108 (90 Base) MCG/ACT inhaler Inhale 2 puffs into the lungs every 6 (six) hours as needed for wheezing or shortness of breath. 1 Inhaler 0  . Aspirin-Caffeine (BC FAST PAIN RELIEF ARTHRITIS PO) Take 1 tablet by mouth daily.    Marland Kitchen ibuprofen (ADVIL,MOTRIN) 200 MG tablet Take 800 mg by mouth daily as needed.    Marland Kitchen levothyroxine (SYNTHROID) 100 MCG tablet TAKE 1 TABLET BY MOUTH EVERY DAY 30 tablet 0  . losartan (COZAAR) 50 MG tablet Take 1 tablet (50 mg total) by mouth daily. 90 tablet 0  . Sertaconazole Nitrate 2 % CREA Apply 1 application topically 2 (two) times daily. 30 g 0   No current facility-administered medications for this visit.    Allergies  Allergen Reactions  . Betadine [Povidone Iodine] Other (See Comments)    Skin irritation  . Vicodin [Hydrocodone-Acetaminophen] Palpitations    Chest pain    Family History  Problem Relation Age of Onset  . Arthritis Mother   . Hyperlipidemia Mother   . Alcohol abuse Father   . Hyperlipidemia Father   . Heart disease Father   . Hypertension Father   .  Arthritis Maternal Grandmother   . Hyperlipidemia Maternal Grandmother   . Heart disease Maternal Grandmother   . Hypertension Maternal Grandmother   . Diabetes Maternal Grandmother   . Hyperlipidemia Maternal Grandfather   . Hypertension Maternal Grandfather   . Hyperlipidemia Paternal Grandmother   . Hypertension Paternal Grandmother   . Stomach cancer Paternal Grandmother   . Alcohol abuse Paternal Grandfather   . Hyperlipidemia Paternal Grandfather   . Heart disease Paternal Grandfather   . Hypertension Paternal Grandfather     Social History   Socioeconomic History  . Marital status: Married    Spouse name: Not on file  . Number of children: Not on file  . Years of education: Not on file  . Highest education level: Not on file  Occupational History  . Not on file  Tobacco Use  . Smoking status: Current Every Day Smoker    Packs/day: 1.50    Years: 35.00    Pack years: 52.50    Types: Cigarettes  . Smokeless tobacco: Never Used  Substance and Sexual Activity  . Alcohol use: Yes    Comment: occasional  . Drug use: No  . Sexual activity: Yes  Other Topics Concern  . Not on file  Social History Narrative  . Not on file   Social Determinants of Health   Financial Resource Strain:   .  Difficulty of Paying Living Expenses: Not on file  Food Insecurity:   . Worried About Charity fundraiser in the Last Year: Not on file  . Ran Out of Food in the Last Year: Not on file  Transportation Needs:   . Lack of Transportation (Medical): Not on file  . Lack of Transportation (Non-Medical): Not on file  Physical Activity:   . Days of Exercise per Week: Not on file  . Minutes of Exercise per Session: Not on file  Stress:   . Feeling of Stress : Not on file  Social Connections:   . Frequency of Communication with Friends and Family: Not on file  . Frequency of Social Gatherings with Friends and Family: Not on file  . Attends Religious Services: Not on file  . Active  Member of Clubs or Organizations: Not on file  . Attends Archivist Meetings: Not on file  . Marital Status: Not on file  Intimate Partner Violence:   . Fear of Current or Ex-Partner: Not on file  . Emotionally Abused: Not on file  . Physically Abused: Not on file  . Sexually Abused: Not on file     Constitutional: Denies fever, malaise, fatigue, headache or abrupt weight changes.  HEENT: Denies eye pain, eye redness, ear pain, ringing in the ears, wax buildup, runny nose, nasal congestion, bloody nose, or sore throat. Respiratory: Denies difficulty breathing, shortness of breath, cough or sputum production.   Cardiovascular: Denies chest pain, chest tightness, palpitations or swelling in the hands or feet.  Gastrointestinal: Denies abdominal pain, bloating, constipation, diarrhea or blood in the stool.  GU: Denies urgency, frequency, pain with urination, burning sensation, blood in urine, odor or discharge. Musculoskeletal: Denies decrease in range of motion, difficulty with gait, muscle pain or joint pain and swelling.  Skin: Denies redness, rashes, lesions or ulcercations.  Neurological: Denies dizziness, difficulty with memory, difficulty with speech or problems with balance and coordination.  Psych: Denies anxiety, depression, SI/HI.  No other specific complaints in a complete review of systems (except as listed in HPI above).  Objective:   Physical Exam   LMP 05/19/2016  Wt Readings from Last 3 Encounters:  02/10/18 (!) 312 lb (141.5 kg)  01/27/18 (!) 314 lb (142.4 kg)  12/14/17 (!) 307 lb (139.3 kg)    General: Appears their stated age, well developed, well nourished in NAD. Skin: Warm, dry and intact. No rashes, lesions or ulcerations noted. HEENT: Head: normal shape and size; Eyes: sclera white, no icterus, conjunctiva pink, PERRLA and EOMs intact; Ears: Tm's gray and intact, normal light reflex; Nose: mucosa pink and moist, septum midline; Throat/Mouth: Teeth  present, mucosa pink and moist, no exudate, lesions or ulcerations noted.  Neck:  Neck supple, trachea midline. No masses, lumps or thyromegaly present.  Cardiovascular: Normal rate and rhythm. S1,S2 noted.  No murmur, rubs or gallops noted. No JVD or BLE edema. No carotid bruits noted. Pulmonary/Chest: Normal effort and positive vesicular breath sounds. No respiratory distress. No wheezes, rales or ronchi noted.  Abdomen: Soft and nontender. Normal bowel sounds. No distention or masses noted. Liver, spleen and kidneys non palpable. Musculoskeletal: Normal range of motion. No signs of joint swelling. No difficulty with gait.  Neurological: Alert and oriented. Cranial nerves II-XII grossly intact. Coordination normal.  Psychiatric: Mood and affect normal. Behavior is normal. Judgment and thought content normal.   EKG:  BMET    Component Value Date/Time   NA 137 02/10/2018 0951   K  4.7 02/10/2018 0951   CL 98 02/10/2018 0951   CO2 33 (H) 02/10/2018 0951   GLUCOSE 112 (H) 02/10/2018 0951   BUN 15 02/10/2018 0951   CREATININE 0.76 02/10/2018 0951   CALCIUM 9.9 02/10/2018 0951    Lipid Panel     Component Value Date/Time   CHOL 164 11/23/2017 0918   TRIG 85.0 11/23/2017 0918   HDL 45.80 11/23/2017 0918   CHOLHDL 4 11/23/2017 0918   VLDL 17.0 11/23/2017 0918   LDLCALC 101 (H) 11/23/2017 0918    CBC    Component Value Date/Time   WBC 7.3 11/23/2017 0918   RBC 5.06 11/23/2017 0918   HGB 15.8 (H) 11/23/2017 0918   HCT 46.4 (H) 11/23/2017 0918   PLT 256.0 11/23/2017 0918   MCV 91.7 11/23/2017 0918   MCHC 34.0 11/23/2017 0918   RDW 13.4 11/23/2017 0918    Hgb A1C Lab Results  Component Value Date   HGBA1C 6.2 11/23/2017           Assessment & Plan:   Preventative Health Maintneance:  Flu Tetanus UTD Pap smear UTD Mammogram ordered, she will call to schedule Colon screening Encouraged her to consume a balanced diet and exercise regimen Advised her to see an  eye doctor and dentist annually  Will check CBC, CMET, Lipid, TSH, Free T4 and Vit D today.  RTC in 1 year, sooner if needed Webb Silversmith, NP This visit occurred during the SARS-CoV-2 public health emergency.  Safety protocols were in place, including screening questions prior to the visit, additional usage of staff PPE, and extensive cleaning of exam room while observing appropriate contact time as indicated for disinfecting solutions.

## 2019-12-07 ENCOUNTER — Other Ambulatory Visit: Payer: Self-pay | Admitting: Internal Medicine

## 2020-01-01 ENCOUNTER — Other Ambulatory Visit: Payer: Self-pay | Admitting: Internal Medicine

## 2020-01-02 ENCOUNTER — Other Ambulatory Visit: Payer: Self-pay | Admitting: Internal Medicine

## 2020-01-17 ENCOUNTER — Telehealth: Payer: Self-pay

## 2020-01-17 NOTE — Telephone Encounter (Signed)
I received this teams message;  [10:57 AM] [place], [place] sent me a message wanted to know if Donna Whitaker can come at 9:30/ I would like to have 30 minutes with her. We also need to make sure that was triaged- r/o angioedema.  ?[10:58 AM] Ozzie Hoyle     thank you  I spoke with pt;pt said for 1-2 months she has had swelling in feet and legs after BP med was changed; initially swelling in legs and feet would go down overnight but not now, the swelling does not go down overnight; no redness,warmth or pain in legs. No rash and no difficulty breathing. Pt does think on and off has upper abd swelling but none this morning. No CP,H/A or dizziness. Pt has not increased her salt intake but has not decreased it either; pt states she eats more salt than she should. I advised pt to cut back on salt and keep legs elevated even when sitting. Pt voiced understanding. Also pt said since 01/11/20 pt woke up with red eyes that were watering. Pt continues with red eyes and watering a clear liquid; no colored drainage from eyes, no itching or burning of eyes. Pt is taking allegra but not helping. Pt vision has not changed, no blurred or double vision. Pt will speak with R Baity NP at appt about red eyes as well. UC & ED precautions given and pt voiced understanding. FYI to Avie Echevaria NP; if no worsening of symptoms pt will keep already scheduled appt with Avie Echevaria NP on 01/18/20.

## 2020-01-17 NOTE — Telephone Encounter (Signed)
Noted, will discuss at upcoming appt 

## 2020-01-18 ENCOUNTER — Encounter: Payer: Self-pay | Admitting: Internal Medicine

## 2020-01-18 ENCOUNTER — Ambulatory Visit (INDEPENDENT_AMBULATORY_CARE_PROVIDER_SITE_OTHER): Payer: 59 | Admitting: Internal Medicine

## 2020-01-18 ENCOUNTER — Other Ambulatory Visit: Payer: Self-pay

## 2020-01-18 ENCOUNTER — Ambulatory Visit: Payer: 59 | Admitting: Internal Medicine

## 2020-01-18 VITALS — BP 148/96 | HR 97 | Temp 97.8°F | Wt 344.0 lb

## 2020-01-18 DIAGNOSIS — H5789 Other specified disorders of eye and adnexa: Secondary | ICD-10-CM

## 2020-01-18 DIAGNOSIS — I10 Essential (primary) hypertension: Secondary | ICD-10-CM

## 2020-01-18 DIAGNOSIS — R609 Edema, unspecified: Secondary | ICD-10-CM

## 2020-01-18 DIAGNOSIS — E039 Hypothyroidism, unspecified: Secondary | ICD-10-CM

## 2020-01-18 DIAGNOSIS — L659 Nonscarring hair loss, unspecified: Secondary | ICD-10-CM

## 2020-01-18 DIAGNOSIS — R635 Abnormal weight gain: Secondary | ICD-10-CM

## 2020-01-18 DIAGNOSIS — R600 Localized edema: Secondary | ICD-10-CM

## 2020-01-18 LAB — COMPREHENSIVE METABOLIC PANEL
ALT: 51 U/L — ABNORMAL HIGH (ref 0–35)
AST: 32 U/L (ref 0–37)
Albumin: 4.1 g/dL (ref 3.5–5.2)
Alkaline Phosphatase: 108 U/L (ref 39–117)
BUN: 10 mg/dL (ref 6–23)
CO2: 35 mEq/L — ABNORMAL HIGH (ref 19–32)
Calcium: 9.6 mg/dL (ref 8.4–10.5)
Chloride: 97 mEq/L (ref 96–112)
Creatinine, Ser: 0.71 mg/dL (ref 0.40–1.20)
GFR: 86.72 mL/min (ref >60.00)
Glucose, Bld: 221 mg/dL — ABNORMAL HIGH (ref 70–99)
Potassium: 5.1 mEq/L (ref 3.5–5.1)
Sodium: 138 mEq/L (ref 135–145)
Total Bilirubin: 0.6 mg/dL (ref 0.2–1.2)
Total Protein: 6.6 g/dL (ref 6.0–8.3)

## 2020-01-18 LAB — BRAIN NATRIURETIC PEPTIDE: Pro B Natriuretic peptide (BNP): 30 pg/mL (ref 0.0–100.0)

## 2020-01-18 LAB — VITAMIN D 25 HYDROXY (VIT D DEFICIENCY, FRACTURES): VITD: 31.26 ng/mL (ref 30.00–100.00)

## 2020-01-18 LAB — VITAMIN B12: Vitamin B-12: 496 pg/mL (ref 211–911)

## 2020-01-18 LAB — T4, FREE: Free T4: 1 ng/dL (ref 0.60–1.60)

## 2020-01-18 LAB — TSH: TSH: 4.96 u[IU]/mL — ABNORMAL HIGH (ref 0.35–4.50)

## 2020-01-18 MED ORDER — OLOPATADINE HCL 0.2 % OP SOLN: 1.0000 [drp] | Freq: Every day | OPHTHALMIC | 0 refills | Status: DC

## 2020-01-18 MED ORDER — FUROSEMIDE 20 MG PO TABS: 20.0000 mg | ORAL_TABLET | Freq: Every day | ORAL | 1 refills | Status: DC

## 2020-01-18 NOTE — Progress Notes (Signed)
Subjective:    Patient ID: Donna Whitaker, female    DOB: 10/09/1971, 49 y.o.   MRN: [id number]  HPI  Pt presents to the clinic today with c/o bilateral eye redness, watering and post nasal drip. This started 1 week ago. She denies eye pain, visual changes, runny nose, nasal congestion, ear pain, loss of taste/smell, cough or SOB. She denies fever, chills or body aches. She has not had sick contacts that she is aware of. She has taken Allegra and eye drops OTC with minimal relief.  She also c/o swelling in her feet. This has been an ongoing issue but seems to have worsened in the last 2 months. The edema used to improve after elevating her legs while sleeping. She reports, now the swelling is present when she wakes up. The left is worse than the right. She denies chest pain or worsening SOB (has some chronic SOB due to smoking/weeight). She denies pain, numbness or tingling. She has had a 32 lb unintentional weight gain. She has not tried anything OTC for this. She has a history of HTN, on Losartan. BP today is 148/96.  She would like a referral to endocrinology for evaluation of her thyroid, weight gain and hair loss.   Review of Systems      Past Medical History:  Diagnosis Date  . Allergy   . History of chicken pox     Current Outpatient Medications  Medication Sig Dispense Refill  . albuterol (PROVENTIL HFA;VENTOLIN HFA) 108 (90 Base) MCG/ACT inhaler Inhale 2 puffs into the lungs every 6 (six) hours as needed for wheezing or shortness of breath. 1 Inhaler 0  . APPLE CIDER VINEGAR PO Take by mouth.    . Aspirin-Caffeine (BC FAST PAIN RELIEF ARTHRITIS PO) Take 1 tablet by mouth daily.    . Cholecalciferol (EQL VITAMIN D3) 50 MCG (2000 UT) CAPS Take by mouth.    . fexofenadine (ALLEGRA) 180 MG tablet Take 180 mg by mouth daily.    Marland Kitchen ibuprofen (ADVIL,MOTRIN) 200 MG tablet Take 800 mg by mouth daily as needed.    Marland Kitchen levothyroxine (SYNTHROID) 100 MCG tablet Take 1 tablet (100 mcg  total) by mouth daily. MUST SCHEDULE PHYSICAL 30 tablet 0  . losartan (COZAAR) 50 MG tablet Take 1 tablet (50 mg total) by mouth daily. MUST SCHEDULE PHYSICAL 90 tablet 0  . Melatonin 5 MG CAPS Take by mouth.    . Omega-3 1000 MG CAPS Take 500 mg by mouth.     . Sertaconazole Nitrate 2 % CREA Apply 1 application topically 2 (two) times daily. 30 g 0  . vitamin B-12 (CYANOCOBALAMIN) 1000 MCG tablet Take 1,000 mcg by mouth daily.     No current facility-administered medications for this visit.    Allergies  Allergen Reactions  . Betadine [Povidone Iodine] Other (See Comments)    Skin irritation  . Vicodin [Hydrocodone-Acetaminophen] Palpitations    Chest pain    Family History  Problem Relation Age of Onset  . Arthritis Mother   . Hyperlipidemia Mother   . Alcohol abuse Father   . Hyperlipidemia Father   . Heart disease Father   . Hypertension Father   . Arthritis Maternal Grandmother   . Hyperlipidemia Maternal Grandmother   . Heart disease Maternal Grandmother   . Hypertension Maternal Grandmother   . Diabetes Maternal Grandmother   . Hyperlipidemia Maternal Grandfather   . Hypertension Maternal Grandfather   . Hyperlipidemia Paternal Grandmother   . Hypertension Paternal Grandmother   .  Stomach cancer Paternal Grandmother   . Alcohol abuse Paternal Grandfather   . Hyperlipidemia Paternal Grandfather   . Heart disease Paternal Grandfather   . Hypertension Paternal Grandfather     Social History   Socioeconomic History  . Marital status: Married    Spouse name: Not on file  . Number of children: Not on file  . Years of education: Not on file  . Highest education level: Not on file  Occupational History  . Not on file  Tobacco Use  . Smoking status: Current Every Day Smoker    Packs/day: 1.50    Years: 35.00    Pack years: 52.50    Types: Cigarettes  . Smokeless tobacco: Never Used  Substance and Sexual Activity  . Alcohol use: Yes    Comment: occasional  .  Drug use: No  . Sexual activity: Yes  Other Topics Concern  . Not on file  Social History Narrative  . Not on file   Social Determinants of Health   Financial Resource Strain:   . Difficulty of Paying Living Expenses:   Food Insecurity:   . Worried About Charity fundraiser in the Last Year:   . Arboriculturist in the Last Year:   Transportation Needs:   . Film/video editor (Medical):   Marland Kitchen Lack of Transportation (Non-Medical):   Physical Activity:   . Days of Exercise per Week:   . Minutes of Exercise per Session:   Stress:   . Feeling of Stress :   Social Connections:   . Frequency of Communication with Friends and Family:   . Frequency of Social Gatherings with Friends and Family:   . Attends Religious Services:   . Active Member of Clubs or Organizations:   . Attends Archivist Meetings:   Marland Kitchen Marital Status:   Intimate Partner Violence:   . Fear of Current or Ex-Partner:   . Emotionally Abused:   Marland Kitchen Physically Abused:   . Sexually Abused:      Constitutional: Pt reports abnormal weight gain. Denies fever, malaise, fatigue, headache.  HEENT: Pt reports eye redness, hair loss. Denies eye pain, ear pain, ringing in the ears, wax buildup, runny nose, nasal congestion, bloody nose, or sore throat. Respiratory: Pt reports chronic shortness of breath. Denies difficulty breathing, cough or sputum production.  Cardiovascular: Pt reports swelling in legs. Denies chest pain, chest tightness, palpitations or swelling in the hands.  Skin: Denies redness, rashes, lesions or ulcercations.  Neurological: Denies dizziness, difficulty with memory, difficulty with speech or problems with balance and coordination.    No other specific complaints in a complete review of systems (except as listed in HPI above).  Objective:   Physical Exam   BP (!) 148/96   Pulse 97   Temp 97.8 F (36.6 C) (Temporal)   Wt (!) 344 lb (156 kg)   LMP 05/19/2016   SpO2 95%   BMI 52.31  kg/m  Wt Readings from Last 3 Encounters:  01/18/20 (!) 344 lb (156 kg)  02/10/18 (!) 312 lb (141.5 kg)  01/27/18 (!) 314 lb (142.4 kg)    General: Appears her stated age, open, in NAD. Skin: Warm, dry and intact. No redness or warmth noted. HEENT: Head: normal shape and size; Eyes: sclera injected, no icterus, conjunctiva pink, PERRLA and EOMs intact;  Neck:  No adenopathy noted. Cardiovascular: Normal rate and rhythm. S1,S2 noted.  No murmur, rubs or gallops noted. 1+ pitting RLE, 2+ pitting LLE edema.  Pulmonary/Chest: Normal effort and coarse breath sounds. No respiratory distress. No wheezes, rales noted.  Musculoskeletal:  No difficulty with gait.  Neurological: Alert and oriented.    BMET    Component Value Date/Time   NA 137 02/10/2018 0951   K 4.7 02/10/2018 0951   CL 98 02/10/2018 0951   CO2 33 (H) 02/10/2018 0951   GLUCOSE 112 (H) 02/10/2018 0951   BUN 15 02/10/2018 0951   CREATININE 0.76 02/10/2018 0951   CALCIUM 9.9 02/10/2018 0951    Lipid Panel     Component Value Date/Time   CHOL 164 11/23/2017 0918   TRIG 85.0 11/23/2017 0918   HDL 45.80 11/23/2017 0918   CHOLHDL 4 11/23/2017 0918   VLDL 17.0 11/23/2017 0918   LDLCALC 101 (H) 11/23/2017 0918    CBC    Component Value Date/Time   WBC 7.3 11/23/2017 0918   RBC 5.06 11/23/2017 0918   HGB 15.8 (H) 11/23/2017 0918   HCT 46.4 (H) 11/23/2017 0918   PLT 256.0 11/23/2017 0918   MCV 91.7 11/23/2017 0918   MCHC 34.0 11/23/2017 0918   RDW 13.4 11/23/2017 0918    Hgb A1C Lab Results  Component Value Date   HGBA1C 6.2 11/23/2017        Assessment & Plan:   HTN, Peripheral Edema:  Will check CMET, BNP Continue Losartan RX for Lasix 20 mg PO daily Encouraged low salt diet, exercise for weight loss  Hypothyroidism, Abnormal Weight Gain, Hair Loss:  Likely fluid Will check TSH and Free T4 today Will check B12, and Vit D today Referral to endocrinology placed per pt request  Bilateral Eye  Redness:  RX for Pataday drops Continue Allegra Try cool compresses for 5 minutes 3 x day  Will follow up after labs, RTC in 2 weeks for follow up BP and edema Webb Silversmith, NP This visit occurred during the SARS-CoV-2 public health emergency.  Safety protocols were in place, including screening questions prior to the visit, additional usage of staff PPE, and extensive cleaning of exam room while observing appropriate contact time as indicated for disinfecting solutions.

## 2020-01-18 NOTE — Patient Instructions (Signed)
Edema  Edema is when you have too much fluid in your body or under your skin. Edema may make your legs, feet, and ankles swell up. Swelling is also common in looser tissues, like around your eyes. This is a common condition. It gets more common as you get older. There are many possible causes of edema. Eating too much salt (sodium) and being on your feet or sitting for a long time can cause edema in your legs, feet, and ankles. Hot weather may make edema worse. Edema is usually painless. Your skin may look swollen or shiny. Follow these instructions at home:  Keep the swollen body part raised (elevated) above the level of your heart when you are sitting or lying down.  Do not sit still or stand for a long time.  Do not wear tight clothes. Do not wear garters on your upper legs.  Exercise your legs. This can help the swelling go down.  Wear elastic bandages or support stockings as told by your doctor.  Eat a low-salt (low-sodium) diet to reduce fluid as told by your doctor.  Depending on the cause of your swelling, you may need to limit how much fluid you drink (fluid restriction).  Take over-the-counter and prescription medicines only as told by your doctor. Contact a doctor if:  Treatment is not working.  You have heart, liver, or kidney disease and have symptoms of edema.  You have sudden and unexplained weight gain. Get help right away if:  You have shortness of breath or chest pain.  You cannot breathe when you lie down.  You have pain, redness, or warmth in the swollen areas.  You have heart, liver, or kidney disease and get edema all of a sudden.  You have a fever and your symptoms get worse all of a sudden. Summary  Edema is when you have too much fluid in your body or under your skin.  Edema may make your legs, feet, and ankles swell up. Swelling is also common in looser tissues, like around your eyes.  Raise (elevate) the swollen body part above the level of your  heart when you are sitting or lying down.  Follow your doctor's instructions about diet and how much fluid you can drink (fluid restriction). This information is not intended to replace advice given to you by your health care provider. Make sure you discuss any questions you have with your health care provider. Document Revised: 10/08/2017 Document Reviewed: 10/23/2016 Elsevier Patient Education  2020 [employer]  

## 2020-01-22 ENCOUNTER — Telehealth: Payer: Self-pay

## 2020-01-22 ENCOUNTER — Other Ambulatory Visit: Payer: Self-pay

## 2020-01-22 ENCOUNTER — Other Ambulatory Visit (INDEPENDENT_AMBULATORY_CARE_PROVIDER_SITE_OTHER): Payer: 59

## 2020-01-22 DIAGNOSIS — R7309 Other abnormal glucose: Secondary | ICD-10-CM | POA: Diagnosis not present

## 2020-01-22 LAB — POCT GLYCOSYLATED HEMOGLOBIN (HGB A1C): Hemoglobin A1C: 8.8 % — AB (ref 4.0–5.6)

## 2020-01-22 MED ORDER — LEVOTHYROXINE SODIUM 112 MCG PO TABS: 112.0000 ug | ORAL_TABLET | Freq: Every day | ORAL | 2 refills | Status: DC

## 2020-01-22 NOTE — Telephone Encounter (Signed)
LVM for pt to call for lab results.   Jearld Fenton, NP  01/21/2020 8:05 PM EDT    Glucose elevated? Was she fasting. I have asked lab to order A1C. If unable, will need POCT A1c. ALT slightly elevated. In the setting of normal AST, ALK Phos, no intervention needed. Avoid alcohol and excessive ET OH use. Vit D and B12 normal. TSH slightly elevated but free T4 normal. Increase Levothyroxine to 112 mcg daily, please send in #30, 2 refills. I have referred to endocrinology per request. No evidence of heart failure.   A1C was done today at a lab apt the pt had.  Medication has been sent to [facility] on [address].

## 2020-01-23 NOTE — Telephone Encounter (Signed)
Patient is returning a call in regards to her lab results

## 2020-01-23 NOTE — Telephone Encounter (Signed)
[facility] - Client Nonclinical Telephone Record AccessNurse Client [facility] - Client Client Site [place] Physician Webb Silversmith - NP Contact Type Call Who Is Calling Patient / Member / Family / Caregiver Caller Name [name] Caller Phone Number n/a Patient Name Donna Whitaker Patient DOB 10/24/1970 Call Type Message Only Information Provided Reason for Call Request for Lab/Test Results Initial Comment Caller is returning call. Additional Comment Office hours provided. Caller mid call decided that she would just call in the am. Disp. Time Disposition Final User 01/22/2020 5:44:48 PM General Information Provided Yes Marshell Garfinkel Call Closed By: Marshell Garfinkel Transaction Date/Time: 01/22/2020 5:42:49 PM (ET)

## 2020-01-30 ENCOUNTER — Ambulatory Visit (INDEPENDENT_AMBULATORY_CARE_PROVIDER_SITE_OTHER): Payer: 59 | Admitting: Internal Medicine

## 2020-01-30 ENCOUNTER — Encounter: Payer: Self-pay | Admitting: Internal Medicine

## 2020-01-30 ENCOUNTER — Other Ambulatory Visit: Payer: Self-pay

## 2020-01-30 VITALS — HR 93 | Temp 97.7°F | Wt 338.0 lb

## 2020-01-30 DIAGNOSIS — N6452 Nipple discharge: Secondary | ICD-10-CM | POA: Diagnosis not present

## 2020-01-30 DIAGNOSIS — E119 Type 2 diabetes mellitus without complications: Secondary | ICD-10-CM | POA: Diagnosis not present

## 2020-01-30 MED ORDER — METFORMIN HCL 1000 MG PO TABS: ORAL_TABLET | ORAL | 3 refills | Status: DC

## 2020-01-30 NOTE — Patient Instructions (Signed)

## 2020-01-30 NOTE — Assessment & Plan Note (Signed)
New onset Discussed DM 2 and standards of medical care Encouraged low carb diet, exercise for weight loss Will start Metformin 1000 mg BID She declines meter, strips and lancets She declines referral for diabetes education and nutrition Foot exam today No urine microalbumin secondary to ARB therapy Referral to ophthalmology for diabetic eye exam

## 2020-01-30 NOTE — Progress Notes (Signed)
Subjective:    Patient ID: Donna Whitaker, female    DOB: 07/21/1971, 49 y.o.   MRN: [id number]  HPI  Pt presents to the clinic today to follow up labs. Her labs showed new onset diabetes with an A1C of 8.8%. She has never been told that she was diabetic in the past.  She also reports intermittent discharge from her left nipple. This has been going on. She reports the discharge is clear. She denies breast pain, mass or changes in the skin. She does not do self breast exams. Her last mammogram was 11/2017.   Review of Systems      Past Medical History:  Diagnosis Date  . Allergy   . History of chicken pox     Current Outpatient Medications  Medication Sig Dispense Refill  . albuterol (PROVENTIL HFA;VENTOLIN HFA) 108 (90 Base) MCG/ACT inhaler Inhale 2 puffs into the lungs every 6 (six) hours as needed for wheezing or shortness of breath. 1 Inhaler 0  . APPLE CIDER VINEGAR PO Take by mouth.    . Aspirin-Caffeine (BC FAST PAIN RELIEF ARTHRITIS PO) Take 1 tablet by mouth daily.    . Cholecalciferol (EQL VITAMIN D3) 50 MCG (2000 UT) CAPS Take by mouth.    . fexofenadine (ALLEGRA) 180 MG tablet Take 180 mg by mouth daily.    . furosemide (LASIX) 20 MG tablet Take 1 tablet (20 mg total) by mouth daily. 30 tablet 1  . ibuprofen (ADVIL,MOTRIN) 200 MG tablet Take 800 mg by mouth daily as needed.    Marland Kitchen levothyroxine (SYNTHROID) 112 MCG tablet Take 1 tablet (112 mcg total) by mouth daily. 30 tablet 2  . losartan (COZAAR) 50 MG tablet Take 1 tablet (50 mg total) by mouth daily. MUST SCHEDULE PHYSICAL 90 tablet 0  . Melatonin 5 MG CAPS Take by mouth.    . Olopatadine HCl 0.2 % SOLN Apply 1 drop to eye daily. 2.5 mL 0  . Omega-3 1000 MG CAPS Take 500 mg by mouth.     . Sertaconazole Nitrate 2 % CREA Apply 1 application topically 2 (two) times daily. 30 g 0  . vitamin B-12 (CYANOCOBALAMIN) 1000 MCG tablet Take 1,000 mcg by mouth daily.     No current facility-administered medications for this  visit.    Allergies  Allergen Reactions  . Betadine [Povidone Iodine] Other (See Comments)    Skin irritation  . Vicodin [Hydrocodone-Acetaminophen] Palpitations    Chest pain    Family History  Problem Relation Age of Onset  . Arthritis Mother   . Hyperlipidemia Mother   . Alcohol abuse Father   . Hyperlipidemia Father   . Heart disease Father   . Hypertension Father   . Arthritis Maternal Grandmother   . Hyperlipidemia Maternal Grandmother   . Heart disease Maternal Grandmother   . Hypertension Maternal Grandmother   . Diabetes Maternal Grandmother   . Hyperlipidemia Maternal Grandfather   . Hypertension Maternal Grandfather   . Hyperlipidemia Paternal Grandmother   . Hypertension Paternal Grandmother   . Stomach cancer Paternal Grandmother   . Alcohol abuse Paternal Grandfather   . Hyperlipidemia Paternal Grandfather   . Heart disease Paternal Grandfather   . Hypertension Paternal Grandfather     Social History   Socioeconomic History  . Marital status: Married    Spouse name: Not on file  . Number of children: Not on file  . Years of education: Not on file  . Highest education level: Not on file  Occupational History  . Not on file  Tobacco Use  . Smoking status: Current Every Day Smoker    Packs/day: 1.50    Years: 35.00    Pack years: 52.50    Types: Cigarettes  . Smokeless tobacco: Never Used  Substance and Sexual Activity  . Alcohol use: Yes    Comment: occasional  . Drug use: No  . Sexual activity: Yes  Other Topics Concern  . Not on file  Social History Narrative  . Not on file   Social Determinants of Health   Financial Resource Strain:   . Difficulty of Paying Living Expenses:   Food Insecurity:   . Worried About Charity fundraiser in the Last Year:   . Arboriculturist in the Last Year:   Transportation Needs:   . Film/video editor (Medical):   Marland Kitchen Lack of Transportation (Non-Medical):   Physical Activity:   . Days of Exercise  per Week:   . Minutes of Exercise per Session:   Stress:   . Feeling of Stress :   Social Connections:   . Frequency of Communication with Friends and Family:   . Frequency of Social Gatherings with Friends and Family:   . Attends Religious Services:   . Active Member of Clubs or Organizations:   . Attends Archivist Meetings:   Marland Kitchen Marital Status:   Intimate Partner Violence:   . Fear of Current or Ex-Partner:   . Emotionally Abused:   Marland Kitchen Physically Abused:   . Sexually Abused:      Constitutional: Denies fever, malaise, fatigue, headache or abrupt weight changes.  HEENT: Denies eye pain, eye redness, ear pain, ringing in the ears, wax buildup, runny nose, nasal congestion, bloody nose, or sore throat. Respiratory: Denies difficulty breathing, shortness of breath, cough or sputum production.   Cardiovascular: Denies chest pain, chest tightness, palpitations or swelling in the hands or feet.  Gastrointestinal: Denies abdominal pain, bloating, constipation, diarrhea or blood in the stool.  GU: Denies urgency, frequency, pain with urination, burning sensation, blood in urine, odor or discharge. Skin: Pt reports nipple discharge from left breast Denies redness, rashes, lesions or ulcercations.  Neurological: Denies dizziness, difficulty with memory, difficulty with speech or problems with balance and coordination.    No other specific complaints in a complete review of systems (except as listed in HPI above).  Objective:   Physical Exam  Pulse 93   Temp 97.7 F (36.5 C) (Temporal)   Wt (!) 338 lb (153.3 kg)   LMP 05/19/2016   SpO2 96%   BMI 51.39 kg/m   Wt Readings from Last 3 Encounters:  01/18/20 (!) 344 lb (156 kg)  02/10/18 (!) 312 lb (141.5 kg)  01/27/18 (!) 314 lb (142.4 kg)    General: Appears her stated age, obese,in NAD. Skin: Warm, dry and intact. No ulcerations noted. Breast symmetrical. No masses or lumps noted. Left nipple slightly inverted. No  discharge expressed from the nipple. HEENT: Head: normal shape and size; Eyes: sclera white, no icterus, conjunctiva pink, PERRLA and EOMs intact;  Cardiovascular: Normal rate and rhythm. S1,S2 noted.  No murmur, rubs or gallops noted.  No carotid bruits noted. Pulmonary/Chest: Normal effort and positive vesicular breath sounds. No respiratory distress. No wheezes, rales or ronchi noted.  Neurological: Alert and oriented. Sensation intact to BLE.   BMET    Component Value Date/Time   NA 138 01/18/2020 0948   K 5.1 01/18/2020 0948   CL 97  01/18/2020 0948   CO2 35 (H) 01/18/2020 0948   GLUCOSE 221 (H) 01/18/2020 0948   BUN 10 01/18/2020 0948   CREATININE 0.71 01/18/2020 0948   CALCIUM 9.6 01/18/2020 0948    Lipid Panel     Component Value Date/Time   CHOL 164 11/23/2017 0918   TRIG 85.0 11/23/2017 0918   HDL 45.80 11/23/2017 0918   CHOLHDL 4 11/23/2017 0918   VLDL 17.0 11/23/2017 0918   LDLCALC 101 (H) 11/23/2017 0918    CBC    Component Value Date/Time   WBC 7.3 11/23/2017 0918   RBC 5.06 11/23/2017 0918   HGB 15.8 (H) 11/23/2017 0918   HCT 46.4 (H) 11/23/2017 0918   PLT 256.0 11/23/2017 0918   MCV 91.7 11/23/2017 0918   MCHC 34.0 11/23/2017 0918   RDW 13.4 11/23/2017 0918    Hgb A1C Lab Results  Component Value Date   HGBA1C 8.8 (A) 01/22/2020          Assessment & Plan:   Nipple Discharge, Left:  Diagnostic mammogram and ultrasound of left breast ordered  RTC in 3 months, follow up DM 2 Webb Silversmith, NP This visit occurred during the SARS-CoV-2 public health emergency.  Safety protocols were in place, including screening questions prior to the visit, additional usage of staff PPE, and extensive cleaning of exam room while observing appropriate contact time as indicated for disinfecting solutions.

## 2020-01-31 ENCOUNTER — Telehealth: Payer: Self-pay

## 2020-01-31 MED ORDER — ONETOUCH ULTRASOFT LANCETS MISC: 1.0000 | Freq: Two times a day (BID) | 1 refills | Status: DC

## 2020-01-31 MED ORDER — ONETOUCH ULTRA VI STRP: 1.0000 | ORAL_STRIP | Freq: Two times a day (BID) | 1 refills | Status: DC

## 2020-01-31 MED ORDER — ONETOUCH ULTRA 2 W/DEVICE KIT: 1.0000 | PACK | Freq: Once | 0 refills | Status: AC

## 2020-01-31 NOTE — Telephone Encounter (Signed)
How often did you want her to test her BG?

## 2020-01-31 NOTE — Telephone Encounter (Signed)
Please send in meter, strips and lancets. Or we can give her one we have in the office, can set up through [facility]

## 2020-01-31 NOTE — Telephone Encounter (Signed)
Rx sent through [facility]  

## 2020-01-31 NOTE — Telephone Encounter (Signed)
BID

## 2020-01-31 NOTE — Telephone Encounter (Signed)
Pt states went to pharmacy to pick up metformin and glucose meter and supplies.  Pharmacy stated to pt no Rx for glucose meter and supplies was sent to them only Rx metformin.

## 2020-01-31 NOTE — Addendum Note (Signed)
Addended by: Lurlean Nanny on: 01/31/2020 05:14 PM   Modules accepted: Orders

## 2020-02-09 ENCOUNTER — Other Ambulatory Visit: Payer: Self-pay | Admitting: Internal Medicine

## 2020-03-10 ENCOUNTER — Other Ambulatory Visit: Payer: Self-pay | Admitting: Internal Medicine

## 2020-03-14 ENCOUNTER — Other Ambulatory Visit: Payer: Self-pay | Admitting: Internal Medicine

## 2020-03-28 ENCOUNTER — Other Ambulatory Visit: Payer: Self-pay | Admitting: Internal Medicine

## 2020-04-04 ENCOUNTER — Telehealth: Payer: Self-pay

## 2020-04-04 NOTE — Telephone Encounter (Signed)
Pt has not scheduled Dx Mammo and breast U/S ordered 01-30-20. [facility] pt 01-30-20 and 02-06-20.  I sent MyChart 02-09-20 and sent letter 5-10 and 03-05-20.

## 2020-04-04 NOTE — Telephone Encounter (Signed)
noted 

## 2020-04-08 ENCOUNTER — Other Ambulatory Visit: Payer: Self-pay | Admitting: Internal Medicine

## 2020-04-20 ENCOUNTER — Other Ambulatory Visit: Payer: Self-pay | Admitting: Internal Medicine

## 2020-04-25 ENCOUNTER — Telehealth: Payer: Self-pay | Admitting: Internal Medicine

## 2020-04-26 NOTE — Telephone Encounter (Signed)
I spoke to pt and she stated that she requested Rx refill for Losartan, did let pt know Rx was refilled June 2021 90 day supply... she stated her Rx said it was filled March 2021. I let pt know if she had not picked up the Rx, they should have the refill still on hand.... while on the phone with pt she went through her Rx bottles and stated that she did indeed have an entire 90 day supply of Losartan medication and that she did not need a refill after all.Marland KitchenMarland Kitchen

## 2020-04-26 NOTE — Telephone Encounter (Signed)
Patient called about refill  She stated that she will run out of medication on Sunday And has appointment scheduled for Tuesday      Also just for an FYI  Patient stated in the conversation that this has happened before and will find a new pcp if this wasn't fixed.

## 2020-04-30 ENCOUNTER — Encounter: Payer: Self-pay | Admitting: Internal Medicine

## 2020-04-30 ENCOUNTER — Other Ambulatory Visit: Payer: Self-pay

## 2020-04-30 ENCOUNTER — Ambulatory Visit (INDEPENDENT_AMBULATORY_CARE_PROVIDER_SITE_OTHER): Payer: 59 | Admitting: Internal Medicine

## 2020-04-30 VITALS — BP 136/84 | HR 91 | Temp 97.0°F | Wt 317.0 lb

## 2020-04-30 DIAGNOSIS — I10 Essential (primary) hypertension: Secondary | ICD-10-CM

## 2020-04-30 DIAGNOSIS — F5101 Primary insomnia: Secondary | ICD-10-CM

## 2020-04-30 DIAGNOSIS — E119 Type 2 diabetes mellitus without complications: Secondary | ICD-10-CM

## 2020-04-30 DIAGNOSIS — E039 Hypothyroidism, unspecified: Secondary | ICD-10-CM

## 2020-04-30 LAB — HEMOGLOBIN A1C: Hgb A1c MFr Bld: 7.6 % — ABNORMAL HIGH (ref 4.6–6.5)

## 2020-04-30 LAB — COMPREHENSIVE METABOLIC PANEL
ALT: 38 U/L — ABNORMAL HIGH (ref 0–35)
AST: 20 U/L (ref 0–37)
Albumin: 4.1 g/dL (ref 3.5–5.2)
Alkaline Phosphatase: 93 U/L (ref 39–117)
BUN: 11 mg/dL (ref 6–23)
CO2: 35 mEq/L — ABNORMAL HIGH (ref 19–32)
Calcium: 9.6 mg/dL (ref 8.4–10.5)
Chloride: 97 mEq/L (ref 96–112)
Creatinine, Ser: 0.73 mg/dL (ref 0.40–1.20)
GFR: 83.89 mL/min (ref >60.00)
Glucose, Bld: 180 mg/dL — ABNORMAL HIGH (ref 70–99)
Potassium: 5 mEq/L (ref 3.5–5.1)
Sodium: 139 mEq/L (ref 135–145)
Total Bilirubin: 0.5 mg/dL (ref 0.2–1.2)
Total Protein: 6.5 g/dL (ref 6.0–8.3)

## 2020-04-30 LAB — T4, FREE: Free T4: 1.1 ng/dL (ref 0.60–1.60)

## 2020-04-30 LAB — TSH: TSH: 3.58 u[IU]/mL (ref 0.35–4.50)

## 2020-04-30 LAB — LIPID PANEL
Cholesterol: 151 mg/dL (ref 0–200)
HDL: 44.2 mg/dL (ref >39.00)
LDL Cholesterol: 86 mg/dL (ref 0–99)
NonHDL: 106.77
Total CHOL/HDL Ratio: 3
Triglycerides: 103 mg/dL (ref 0.0–149.0)
VLDL: 20.6 mg/dL (ref 0.0–40.0)

## 2020-04-30 NOTE — Assessment & Plan Note (Signed)
Continue Losartan and Furosemide Reinforced DASH diet CMET today

## 2020-04-30 NOTE — Assessment & Plan Note (Signed)
C met, lipid and A1c today Encouraged her to consume a low-fat, low-carb diet and exercise for weight loss Continue Metformin for now, will consider alternatives given GI upset Encouraged routine eye exams Encourage routine foot exams She declines flu, Pneumovax or Covid

## 2020-04-30 NOTE — Assessment & Plan Note (Signed)
TSH and free T4 today Continue Levothyroxine, will adjust if needed based on labs

## 2020-04-30 NOTE — Assessment & Plan Note (Addendum)
She will get Melatonin OTC

## 2020-04-30 NOTE — Patient Instructions (Signed)
Carbohydrate Counting for Diabetes Mellitus, Adult  Carbohydrate counting is a method of keeping track of how many carbohydrates you eat. Eating carbohydrates naturally increases the amount of sugar (glucose) in the blood. Counting how many carbohydrates you eat helps keep your blood glucose within normal limits, which helps you manage your diabetes (diabetes mellitus). It is important to know how many carbohydrates you can safely have in each meal. This is different for every person. A diet and nutrition specialist (registered dietitian) can help you make a meal plan and calculate how many carbohydrates you should have at each meal and snack. Carbohydrates are found in the following foods:  Grains, such as breads and cereals.  Dried beans and soy products.  Starchy vegetables, such as potatoes, peas, and corn.  Fruit and fruit juices.  Milk and yogurt.  Sweets and snack foods, such as cake, cookies, candy, chips, and soft drinks. How do I count carbohydrates? There are two ways to count carbohydrates in food. You can use either of the methods or a combination of both. Reading "Nutrition Facts" on packaged food The "Nutrition Facts" list is included on the labels of almost all packaged foods and beverages in the [place] It includes:  The serving size.  Information about nutrients in each serving, including the grams (g) of carbohydrate per serving. To use the "Nutrition Facts":  Decide how many servings you will have.  Multiply the number of servings by the number of carbohydrates per serving.  The resulting number is the total amount of carbohydrates that you will be having. Learning standard serving sizes of other foods When you eat carbohydrate foods that are not packaged or do not include "Nutrition Facts" on the label, you need to measure the servings in order to count the amount of carbohydrates:  Measure the foods that you will eat with a food scale or measuring cup, if  needed.  Decide how many standard-size servings you will eat.  Multiply the number of servings by 15. Most carbohydrate-rich foods have about 15 g of carbohydrates per serving. ? For example, if you eat 8 oz (170 g) of strawberries, you will have eaten 2 servings and 30 g of carbohydrates (2 servings x 15 g = 30 g).  For foods that have more than one food mixed, such as soups and casseroles, you must count the carbohydrates in each food that is included. The following list contains standard serving sizes of common carbohydrate-rich foods. Each of these servings has about 15 g of carbohydrates:   hamburger bun or  English muffin.   oz (15 mL) syrup.   oz (14 g) jelly.  1 slice of bread.  1 six-inch tortilla.  3 oz (85 g) cooked rice or pasta.  4 oz (113 g) cooked dried beans.  4 oz (113 g) starchy vegetable, such as peas, corn, or potatoes.  4 oz (113 g) hot cereal.  4 oz (113 g) mashed potatoes or  of a large baked potato.  4 oz (113 g) canned or frozen fruit.  4 oz (120 mL) fruit juice.  4-6 crackers.  6 chicken nuggets.  6 oz (170 g) unsweetened dry cereal.  6 oz (170 g) plain fat-free yogurt or yogurt sweetened with artificial sweeteners.  8 oz (240 mL) milk.  8 oz (170 g) fresh fruit or one small piece of fruit.  24 oz (680 g) popped popcorn. Example of carbohydrate counting Sample meal  3 oz (85 g) chicken breast.  6 oz (170 g)   brown rice.  4 oz (113 g) corn.  8 oz (240 mL) milk.  8 oz (170 g) strawberries with sugar-free whipped topping. Carbohydrate calculation 1. Identify the foods that contain carbohydrates: ? Rice. ? Corn. ? Milk. ? Strawberries. 2. Calculate how many servings you have of each food: ? 2 servings rice. ? 1 serving corn. ? 1 serving milk. ? 1 serving strawberries. 3. Multiply each number of servings by 15 g: ? 2 servings rice x 15 g = 30 g. ? 1 serving corn x 15 g = 15 g. ? 1 serving milk x 15 g = 15 g. ? 1  serving strawberries x 15 g = 15 g. 4. Add together all of the amounts to find the total grams of carbohydrates eaten: ? 30 g + 15 g + 15 g + 15 g = 75 g of carbohydrates total. Summary  Carbohydrate counting is a method of keeping track of how many carbohydrates you eat.  Eating carbohydrates naturally increases the amount of sugar (glucose) in the blood.  Counting how many carbohydrates you eat helps keep your blood glucose within normal limits, which helps you manage your diabetes.  A diet and nutrition specialist (registered dietitian) can help you make a meal plan and calculate how many carbohydrates you should have at each meal and snack. This information is not intended to replace advice given to you by your health care provider. Make sure you discuss any questions you have with your health care provider. Document Revised: 04/29/2017 Document Reviewed: 03/18/2016 Elsevier Patient Education  2020 [employer]  

## 2020-04-30 NOTE — Progress Notes (Signed)
Subjective:    Patient ID: Donna Whitaker, female    DOB: 08/19/1971, 49 y.o.   MRN: [id number]  HPI  Patient presents the clinic today for 65-month follow-up of chronic conditions.  DM2: Her last A1c was 8.8%, 01/2020.  She is only taking Metformin 1 x day instead of 2 x day due to GI upset. She does not routinely check her sugars. She checks her feet routinely. Her last eye exam was more than 2 years ago.  Flu never.  Pneumovax never.  Covid never.  HTN: Her BP today is 136/84.  She is taking Losartan and Furosemide as prescribed.  There is no ECG on file.  Insomnia: She has trouble staying asleep.  She is not taking any medication for sleep at this time.  There is no sleep study on file.  Hypothyroidism: She denies any issues on her current dose of Levothyroxine.  Review of Systems      Past Medical History:  Diagnosis Date  . Allergy   . History of chicken pox     Current Outpatient Medications  Medication Sig Dispense Refill  . albuterol (PROVENTIL HFA;VENTOLIN HFA) 108 (90 Base) MCG/ACT inhaler Inhale 2 puffs into the lungs every 6 (six) hours as needed for wheezing or shortness of breath. 1 Inhaler 0  . APPLE CIDER VINEGAR PO Take by mouth.    . Aspirin-Caffeine (BC FAST PAIN RELIEF ARTHRITIS PO) Take 1 tablet by mouth daily.    . Cholecalciferol (EQL VITAMIN D3) 50 MCG (2000 UT) CAPS Take by mouth.    . fexofenadine (ALLEGRA) 180 MG tablet Take 180 mg by mouth daily.    . furosemide (LASIX) 20 MG tablet Take 1 tablet (20 mg total) by mouth daily. MUST SCHEDULE PHYSICAL EXAM 30 tablet 0  . glucose blood (ONETOUCH ULTRA) test strip 1 each by Other route in the morning and at bedtime. 200 each 1  . ibuprofen (ADVIL,MOTRIN) 200 MG tablet Take 800 mg by mouth daily as needed.    . Lancets (ONETOUCH ULTRASOFT) lancets 1 each by Other route in the morning and at bedtime. 200 each 1  . levothyroxine (SYNTHROID) 112 MCG tablet TAKE 1 TABLET BY MOUTH EVERY DAY 30 tablet 0  .  losartan (COZAAR) 50 MG tablet TAKE 1 TABLET (50 MG TOTAL) BY MOUTH DAILY. MUST SCHEDULE PHYSICAL 90 tablet 0  . Melatonin 5 MG CAPS Take by mouth.    . metFORMIN (GLUCOPHAGE) 1000 MG tablet Take 0.5 tablets (500 mg total) by mouth 2 (two) times daily with a meal for 10 days, THEN 1 tablet (1,000 mg total) 2 (two) times daily with a meal. 180 tablet 3  . Olopatadine HCl 0.2 % SOLN INSTILL 1 DROP INTO AFFECTED EYE EVERY DAY 2.5 mL 0  . Omega-3 1000 MG CAPS Take 500 mg by mouth.     . Sertaconazole Nitrate 2 % CREA Apply 1 application topically 2 (two) times daily. 30 g 0  . vitamin B-12 (CYANOCOBALAMIN) 1000 MCG tablet Take 1,000 mcg by mouth daily.     No current facility-administered medications for this visit.    Allergies  Allergen Reactions  . Betadine [Povidone Iodine] Other (See Comments)    Skin irritation  . Vicodin [Hydrocodone-Acetaminophen] Palpitations    Chest pain    Family History  Problem Relation Age of Onset  . Arthritis Mother   . Hyperlipidemia Mother   . Alcohol abuse Father   . Hyperlipidemia Father   . Heart disease Father   .  Hypertension Father   . Arthritis Maternal Grandmother   . Hyperlipidemia Maternal Grandmother   . Heart disease Maternal Grandmother   . Hypertension Maternal Grandmother   . Diabetes Maternal Grandmother   . Hyperlipidemia Maternal Grandfather   . Hypertension Maternal Grandfather   . Hyperlipidemia Paternal Grandmother   . Hypertension Paternal Grandmother   . Stomach cancer Paternal Grandmother   . Alcohol abuse Paternal Grandfather   . Hyperlipidemia Paternal Grandfather   . Heart disease Paternal Grandfather   . Hypertension Paternal Grandfather     Social History   Socioeconomic History  . Marital status: Married    Spouse name: Not on file  . Number of children: Not on file  . Years of education: Not on file  . Highest education level: Not on file  Occupational History  . Not on file  Tobacco Use  . Smoking  status: Current Every Day Smoker    Packs/day: 1.50    Years: 35.00    Pack years: 52.50    Types: Cigarettes  . Smokeless tobacco: Never Used  Substance and Sexual Activity  . Alcohol use: Yes    Comment: occasional  . Drug use: No  . Sexual activity: Yes  Other Topics Concern  . Not on file  Social History Narrative  . Not on file   Social Determinants of Health   Financial Resource Strain:   . Difficulty of Paying Living Expenses:   Food Insecurity:   . Worried About Charity fundraiser in the Last Year:   . Arboriculturist in the Last Year:   Transportation Needs:   . Film/video editor (Medical):   Marland Kitchen Lack of Transportation (Non-Medical):   Physical Activity:   . Days of Exercise per Week:   . Minutes of Exercise per Session:   Stress:   . Feeling of Stress :   Social Connections:   . Frequency of Communication with Friends and Family:   . Frequency of Social Gatherings with Friends and Family:   . Attends Religious Services:   . Active Member of Clubs or Organizations:   . Attends Archivist Meetings:   Marland Kitchen Marital Status:   Intimate Partner Violence:   . Fear of Current or Ex-Partner:   . Emotionally Abused:   Marland Kitchen Physically Abused:   . Sexually Abused:      Constitutional: Denies fever, malaise, fatigue, headache or abrupt weight changes.  HEENT: Denies eye pain, eye redness, ear pain, ringing in the ears, wax buildup, runny nose, nasal congestion, bloody nose, or sore throat. Respiratory: Denies difficulty breathing, shortness of breath, cough or sputum production.   Cardiovascular: Denies chest pain, chest tightness, palpitations or swelling in the hands or feet.  Gastrointestinal: Pt reports intermittent loose stools. Denies abdominal pain, bloating, constipation, diarrhea or blood in the stool.  Musculoskeletal: Denies decrease in range of motion, difficulty with gait, muscle pain or joint pain and swelling.  Skin: Denies redness, rashes,  lesions or ulcercations.  Neurological: Patient has a history of insomnia.  Denies dizziness, difficulty with memory, difficulty with speech or problems with balance and coordination.    No other specific complaints in a complete review of systems (except as listed in HPI above).  Objective:   Physical Exam  BP 136/84   Pulse 91   Temp (!) 97 F (36.1 C) (Temporal)   Wt (!) 317 lb (143.8 kg)   LMP 05/19/2016   SpO2 95%   BMI 48.20 kg/m  Wt Readings from Last 3 Encounters:  01/30/20 (!) 338 lb (153.3 kg)  01/18/20 (!) 344 lb (156 kg)  02/10/18 (!) 312 lb (141.5 kg)    General: Appears her stated age, obese, in NAD. Skin: Warm, dry and intact. No ulcerations noted. HEENT: Head: normal shape and size; Eyes: sclera white, no icterus, conjunctiva pink, PERRLA and EOMs intact;  Neck:  Neck supple, trachea midline. No masses, lumps or thyromegaly present.  Cardiovascular: Normal rate and rhythm. S1,S2 noted.  No murmur, rubs or gallops noted. No JVD or BLE edema. No carotid bruits noted. Pulmonary/Chest: Normal effort and positive vesicular breath sounds. No respiratory distress. No wheezes, rales or ronchi noted.  Musculoskeletal: No difficulty with gait.  Neurological: Alert and oriented. Sensation intact to BLE.   BMET    Component Value Date/Time   NA 138 01/18/2020 0948   K 5.1 01/18/2020 0948   CL 97 01/18/2020 0948   CO2 35 (H) 01/18/2020 0948   GLUCOSE 221 (H) 01/18/2020 0948   BUN 10 01/18/2020 0948   CREATININE 0.71 01/18/2020 0948   CALCIUM 9.6 01/18/2020 0948    Lipid Panel     Component Value Date/Time   CHOL 164 11/23/2017 0918   TRIG 85.0 11/23/2017 0918   HDL 45.80 11/23/2017 0918   CHOLHDL 4 11/23/2017 0918   VLDL 17.0 11/23/2017 0918   LDLCALC 101 (H) 11/23/2017 0918    CBC    Component Value Date/Time   WBC 7.3 11/23/2017 0918   RBC 5.06 11/23/2017 0918   HGB 15.8 (H) 11/23/2017 0918   HCT 46.4 (H) 11/23/2017 0918   PLT 256.0 11/23/2017  0918   MCV 91.7 11/23/2017 0918   MCHC 34.0 11/23/2017 0918   RDW 13.4 11/23/2017 0918    Hgb A1C Lab Results  Component Value Date   HGBA1C 8.8 (A) 01/22/2020            Assessment & Plan:    Donna Silversmith, NP This visit occurred during the SARS-CoV-2 public health emergency.  Safety protocols were in place, including screening questions prior to the visit, additional usage of staff PPE, and extensive cleaning of exam room while observing appropriate contact time as indicated for disinfecting solutions.

## 2020-05-03 ENCOUNTER — Encounter: Payer: Self-pay | Admitting: Internal Medicine

## 2020-05-09 ENCOUNTER — Other Ambulatory Visit: Payer: Self-pay | Admitting: Internal Medicine

## 2020-05-15 MED ORDER — GLIPIZIDE 5 MG PO TABS
5.0000 mg | ORAL_TABLET | Freq: Two times a day (BID) | ORAL | 3 refills | Status: DC
Start: 2020-05-15 — End: 2020-08-09

## 2020-05-17 ENCOUNTER — Other Ambulatory Visit: Payer: Self-pay | Admitting: Internal Medicine

## 2020-05-17 DIAGNOSIS — N6452 Nipple discharge: Secondary | ICD-10-CM

## 2020-05-22 ENCOUNTER — Other Ambulatory Visit: Payer: Self-pay | Admitting: Internal Medicine

## 2020-06-03 ENCOUNTER — Ambulatory Visit
Admission: RE | Admit: 2020-06-03 | Discharge: 2020-06-03 | Disposition: A | Discharge status: Home / Self Care | Payer: 59 | Source: Ambulatory Visit | Attending: Internal Medicine | Admitting: Internal Medicine

## 2020-06-03 ENCOUNTER — Other Ambulatory Visit: Payer: Self-pay | Admitting: Internal Medicine

## 2020-06-03 ENCOUNTER — Other Ambulatory Visit: Payer: Self-pay

## 2020-06-03 DIAGNOSIS — N6452 Nipple discharge: Secondary | ICD-10-CM

## 2020-06-06 ENCOUNTER — Other Ambulatory Visit: Payer: Self-pay | Admitting: Internal Medicine

## 2020-06-12 ENCOUNTER — Ambulatory Visit
Admission: RE | Admit: 2020-06-12 | Discharge: 2020-06-12 | Disposition: A | Discharge status: Home / Self Care | Payer: 59 | Source: Ambulatory Visit | Attending: Internal Medicine | Admitting: Internal Medicine

## 2020-06-12 ENCOUNTER — Other Ambulatory Visit: Payer: Self-pay

## 2020-06-12 DIAGNOSIS — N6452 Nipple discharge: Secondary | ICD-10-CM

## 2020-06-13 ENCOUNTER — Telehealth: Payer: Self-pay

## 2020-06-13 NOTE — Telephone Encounter (Signed)
Pt said at a previous appt R Baityi NP had offered pt med for depression and pt declined. Pt last visit 04/30/20 but did not discuss anxiety. Pt said she is concerned about both her sons for different reasons and recently had breast biopsy and pt is feeling anxious. Pt said no SI/HI. Pt scheduled 30' appt to see Avie Echevaria NP on 06/18/20 at 11:15. UC & ED precautions given and pt voiced understanding. FYI to Avie Echevaria NP.

## 2020-06-13 NOTE — Telephone Encounter (Signed)
noted 

## 2020-06-18 ENCOUNTER — Ambulatory Visit (INDEPENDENT_AMBULATORY_CARE_PROVIDER_SITE_OTHER): Payer: 59 | Admitting: Internal Medicine

## 2020-06-18 ENCOUNTER — Other Ambulatory Visit: Payer: Self-pay

## 2020-06-18 ENCOUNTER — Encounter: Payer: Self-pay | Admitting: Internal Medicine

## 2020-06-18 DIAGNOSIS — F329 Major depressive disorder, single episode, unspecified: Secondary | ICD-10-CM | POA: Diagnosis not present

## 2020-06-18 DIAGNOSIS — F32A Depression, unspecified: Secondary | ICD-10-CM | POA: Insufficient documentation

## 2020-06-18 DIAGNOSIS — F419 Anxiety disorder, unspecified: Secondary | ICD-10-CM

## 2020-06-18 MED ORDER — HYDROXYZINE HCL 10 MG PO TABS
10.0000 mg | ORAL_TABLET | Freq: Every day | ORAL | 0 refills | Status: DC | PRN
Start: 2020-06-18 — End: 2020-07-11

## 2020-06-18 NOTE — Progress Notes (Signed)
Subjective:    Patient ID: Donna Whitaker, female    DOB: 05-27-1971, 49 y.o.   MRN: [id number]  HPI  Patient presents the clinic today with complaint of anxiety and depression.  She reports having a lot of stress within the home.  Her son is currently living with her, and he struggles with substance abuse.   Her other son is a single father who is currently in a stressful relationship.  She worries about them all the time.  She has never had issues with anxiety or depression in the past.  She is not currently seeing a therapist.  She denies SI/HI. She has a good support system at home.  Review of Systems      Past Medical History:  Diagnosis Date  . Allergy   . History of chicken pox     Current Outpatient Medications  Medication Sig Dispense Refill  . albuterol (PROVENTIL HFA;VENTOLIN HFA) 108 (90 Base) MCG/ACT inhaler Inhale 2 puffs into the lungs every 6 (six) hours as needed for wheezing or shortness of breath. 1 Inhaler 0  . APPLE CIDER VINEGAR PO Take by mouth.    . Aspirin-Caffeine (BC FAST PAIN RELIEF ARTHRITIS PO) Take 1 tablet by mouth daily.    . Cholecalciferol (EQL VITAMIN D3) 50 MCG (2000 UT) CAPS Take by mouth.    . fexofenadine (ALLEGRA) 180 MG tablet Take 180 mg by mouth daily.    . furosemide (LASIX) 20 MG tablet Take 1 tablet (20 mg total) by mouth daily. SCHEDULE PHYSICAL FOR October 90 tablet 0  . glipiZIDE (GLUCOTROL) 5 MG tablet Take 1 tablet (5 mg total) by mouth 2 (two) times daily before a meal. 60 tablet 3  . glucose blood (ONETOUCH ULTRA) test strip 1 each by Other route in the morning and at bedtime. 200 each 1  . ibuprofen (ADVIL,MOTRIN) 200 MG tablet Take 800 mg by mouth daily as needed.    . Lancets (ONETOUCH ULTRASOFT) lancets 1 each by Other route in the morning and at bedtime. 200 each 1  . levothyroxine (SYNTHROID) 112 MCG tablet TAKE 1 TABLET BY MOUTH EVERY DAY 90 tablet 0  . losartan (COZAAR) 50 MG tablet TAKE 1 TABLET (50 MG TOTAL) BY MOUTH  DAILY. MUST SCHEDULE PHYSICAL 90 tablet 0  . Melatonin 5 MG CAPS Take by mouth.    . Olopatadine HCl 0.2 % SOLN INSTILL 1 DROP INTO AFFECTED EYE EVERY DAY 2.5 mL 0  . Omega-3 1000 MG CAPS Take 500 mg by mouth.     . Sertaconazole Nitrate 2 % CREA Apply 1 application topically 2 (two) times daily. 30 g 0  . vitamin B-12 (CYANOCOBALAMIN) 1000 MCG tablet Take 1,000 mcg by mouth daily.     No current facility-administered medications for this visit.    Allergies  Allergen Reactions  . Betadine [Povidone Iodine] Other (See Comments)    Skin irritation  . Vicodin [Hydrocodone-Acetaminophen] Palpitations    Chest pain    Family History  Problem Relation Age of Onset  . Arthritis Mother   . Hyperlipidemia Mother   . Alcohol abuse Father   . Hyperlipidemia Father   . Heart disease Father   . Hypertension Father   . Arthritis Maternal Grandmother   . Hyperlipidemia Maternal Grandmother   . Heart disease Maternal Grandmother   . Hypertension Maternal Grandmother   . Diabetes Maternal Grandmother   . Hyperlipidemia Maternal Grandfather   . Hypertension Maternal Grandfather   . Hyperlipidemia Paternal Grandmother   .  Hypertension Paternal Grandmother   . Stomach cancer Paternal Grandmother   . Alcohol abuse Paternal Grandfather   . Hyperlipidemia Paternal Grandfather   . Heart disease Paternal Grandfather   . Hypertension Paternal Grandfather     Social History   Socioeconomic History  . Marital status: Married    Spouse name: Not on file  . Number of children: Not on file  . Years of education: Not on file  . Highest education level: Not on file  Occupational History  . Not on file  Tobacco Use  . Smoking status: Current Every Day Smoker    Packs/day: 1.50    Years: 35.00    Pack years: 52.50    Types: Cigarettes  . Smokeless tobacco: Never Used  Substance and Sexual Activity  . Alcohol use: Yes    Comment: occasional  . Drug use: No  . Sexual activity: Yes  Other  Topics Concern  . Not on file  Social History Narrative  . Not on file   Social Determinants of Health   Financial Resource Strain:   . Difficulty of Paying Living Expenses: Not on file  Food Insecurity:   . Worried About Charity fundraiser in the Last Year: Not on file  . Ran Out of Food in the Last Year: Not on file  Transportation Needs:   . Lack of Transportation (Medical): Not on file  . Lack of Transportation (Non-Medical): Not on file  Physical Activity:   . Days of Exercise per Week: Not on file  . Minutes of Exercise per Session: Not on file  Stress:   . Feeling of Stress : Not on file  Social Connections:   . Frequency of Communication with Friends and Family: Not on file  . Frequency of Social Gatherings with Friends and Family: Not on file  . Attends Religious Services: Not on file  . Active Member of Clubs or Organizations: Not on file  . Attends Archivist Meetings: Not on file  . Marital Status: Not on file  Intimate Partner Violence:   . Fear of Current or Ex-Partner: Not on file  . Emotionally Abused: Not on file  . Physically Abused: Not on file  . Sexually Abused: Not on file     Constitutional: Denies fever, malaise, fatigue, headache or abrupt weight changes.  Respiratory: Denies difficulty breathing, shortness of breath, cough or sputum production.   Cardiovascular: Denies chest pain, chest tightness, palpitations or swelling in the hands or feet.  Neurological: Denies dizziness, difficulty with memory, difficulty with speech or problems with balance and coordination.  Psych: Patient reports anxiety and depression.  Denies SI/HI.  No other specific complaints in a complete review of systems (except as listed in HPI above).  Objective:   Physical Exam  BP (!) 142/90   Pulse 89   Temp (!) 96.9 F (36.1 C) (Temporal)   Wt (!) 309 lb (140.2 kg)   LMP 05/19/2016   SpO2 98%   BMI 46.98 kg/m   Wt Readings from Last 3 Encounters:    04/30/20 (!) 317 lb (143.8 kg)  01/30/20 (!) 338 lb (153.3 kg)  01/18/20 (!) 344 lb (156 kg)    General: Appears her stated age, obese, in NAD. Cardiovascular: Normal rate and rhythm.  Pulmonary/Chest: Normal effort and positive vesicular breath sounds. No respiratory distress. No wheezes, rales or ronchi noted.  Neurological: Alert and oriented.  Psychiatric: Tearful. Behavior is normal. Judgment and thought content normal.    BMET  Component Value Date/Time   NA 139 04/30/2020 0921   K 5.0 04/30/2020 0921   CL 97 04/30/2020 0921   CO2 35 (H) 04/30/2020 0921   GLUCOSE 180 (H) 04/30/2020 0921   BUN 11 04/30/2020 0921   CREATININE 0.73 04/30/2020 0921   CALCIUM 9.6 04/30/2020 0921    Lipid Panel     Component Value Date/Time   CHOL 151 04/30/2020 0921   TRIG 103.0 04/30/2020 0921   HDL 44.20 04/30/2020 0921   CHOLHDL 3 04/30/2020 0921   VLDL 20.6 04/30/2020 0921   LDLCALC 86 04/30/2020 0921    CBC    Component Value Date/Time   WBC 7.3 11/23/2017 0918   RBC 5.06 11/23/2017 0918   HGB 15.8 (H) 11/23/2017 0918   HCT 46.4 (H) 11/23/2017 0918   PLT 256.0 11/23/2017 0918   MCV 91.7 11/23/2017 0918   MCHC 34.0 11/23/2017 0918   RDW 13.4 11/23/2017 0918    Hgb A1C Lab Results  Component Value Date   HGBA1C 7.6 (H) 04/30/2020            Assessment & Plan:   Webb Silversmith, NP This visit occurred during the SARS-CoV-2 public health emergency.  Safety protocols were in place, including screening questions prior to the visit, additional usage of staff PPE, and extensive cleaning of exam room while observing appropriate contact time as indicated for disinfecting solutions.

## 2020-06-18 NOTE — Patient Instructions (Signed)

## 2020-06-18 NOTE — Assessment & Plan Note (Signed)
Support offered today She is not interested in therapy at this time She declines daily medication for mood RX for Hydroxyzine 10 mg daily prn- sedation caution given  Update me in 2-3 weeks and let me know how you are doing

## 2020-06-20 ENCOUNTER — Encounter: Payer: Self-pay | Admitting: Internal Medicine

## 2020-07-05 ENCOUNTER — Other Ambulatory Visit: Payer: Self-pay | Admitting: Surgery

## 2020-07-05 DIAGNOSIS — D242 Benign neoplasm of left breast: Secondary | ICD-10-CM

## 2020-07-05 MED ORDER — SERTRALINE HCL 25 MG PO TABS: 25.0000 mg | ORAL_TABLET | Freq: Every day | ORAL | 2 refills | Status: DC

## 2020-07-05 NOTE — Addendum Note (Signed)
Addended by: Jearld Fenton on: 07/05/2020 01:35 PM   Modules accepted: Orders

## 2020-07-10 ENCOUNTER — Other Ambulatory Visit: Payer: Self-pay | Admitting: Internal Medicine

## 2020-07-21 ENCOUNTER — Other Ambulatory Visit: Payer: Self-pay | Admitting: Internal Medicine

## 2020-07-22 ENCOUNTER — Other Ambulatory Visit: Payer: Self-pay | Admitting: Surgery

## 2020-07-22 DIAGNOSIS — D242 Benign neoplasm of left breast: Secondary | ICD-10-CM

## 2020-07-26 ENCOUNTER — Other Ambulatory Visit: Payer: Self-pay | Admitting: Internal Medicine

## 2020-07-26 ENCOUNTER — Encounter: Payer: Self-pay | Admitting: Internal Medicine

## 2020-07-27 ENCOUNTER — Other Ambulatory Visit: Payer: Self-pay | Admitting: Internal Medicine

## 2020-08-02 ENCOUNTER — Encounter (HOSPITAL_BASED_OUTPATIENT_CLINIC_OR_DEPARTMENT_OTHER): Payer: Self-pay | Admitting: Surgery

## 2020-08-02 ENCOUNTER — Other Ambulatory Visit: Payer: Self-pay

## 2020-08-05 ENCOUNTER — Other Ambulatory Visit (HOSPITAL_COMMUNITY)
Admission: RE | Admit: 2020-08-05 | Discharge: 2020-08-05 | Disposition: A | Discharge status: Home / Self Care | Payer: 59 | Source: Ambulatory Visit | Attending: Surgery | Admitting: Surgery

## 2020-08-05 ENCOUNTER — Encounter (HOSPITAL_BASED_OUTPATIENT_CLINIC_OR_DEPARTMENT_OTHER)
Admission: RE | Admit: 2020-08-05 | Discharge: 2020-08-05 | Disposition: A | Discharge status: Home / Self Care | Payer: 59 | Source: Ambulatory Visit | Attending: Surgery | Admitting: Surgery

## 2020-08-05 DIAGNOSIS — Z01818 Encounter for other preprocedural examination: Secondary | ICD-10-CM | POA: Diagnosis not present

## 2020-08-05 DIAGNOSIS — Z20822 Contact with and (suspected) exposure to covid-19: Secondary | ICD-10-CM | POA: Insufficient documentation

## 2020-08-05 DIAGNOSIS — Z01812 Encounter for preprocedural laboratory examination: Secondary | ICD-10-CM | POA: Insufficient documentation

## 2020-08-05 LAB — BASIC METABOLIC PANEL
Anion gap: 9 (ref 5–15)
BUN: 9 mg/dL (ref 6–20)
CO2: 30 mmol/L (ref 22–32)
Calcium: 9.8 mg/dL (ref 8.9–10.3)
Chloride: 101 mmol/L (ref 98–111)
Creatinine, Ser: 0.78 mg/dL (ref 0.44–1.00)
GFR, Estimated: >60 mL/min (ref >60)
Glucose, Bld: 127 mg/dL — ABNORMAL HIGH (ref 70–99)
Potassium: 4.9 mmol/L (ref 3.5–5.1)
Sodium: 140 mmol/L (ref 135–145)

## 2020-08-05 LAB — SARS CORONAVIRUS 2 (TAT 6-24 HRS): SARS Coronavirus 2: NEGATIVE

## 2020-08-05 MED ORDER — ENSURE PRE-SURGERY PO LIQD: 296.0000 mL | Freq: Once | ORAL | Status: DC

## 2020-08-05 NOTE — Progress Notes (Signed)

## 2020-08-07 NOTE — H&P (Signed)
Donna Whitaker  Location: [facility] Patient #: [id number] DOB: 09/25/1971 Married / Language: English / Race: White Female   History of Present Illness  The patient is a 49 year old female who presents with a complaint of Breast problems.  Chief complaint: Bloody nipple discharge from left breast  This is a 49 year old female referred by the breast center for the above-mentioned complaint. This is been occurring for several weeks. She underwent mammograms and ultrasound. This showed 2 separate small intraductal masses in the left breast. They were in the same duct. One measured 4 mm and the other measured 3 mm. They were only 5 mm apart. 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 She is had no other problems with her breasts. She has no family history of breast cancer. She is otherwise without complaints.   Past Surgical History [facility], [place];  Breast Biopsy  Left.  Diagnostic Studies History [facility], [place];  Colonoscopy  never Mammogram  within last year Pap Smear  1-5 years ago  Allergies Darden Palmer, RMA; Betadine *ANTISEPTICS & DISINFECTANTS*  Vicodin *ANALGESICS - OPIOID*  Irregular heart rate. Allergies Reconciled   Medication History Darden Palmer, RMA;  Vitamin D (Oral) Specific strength unknown - Active.  Pregnancy / Birth History Darden Palmer, [place]; Age at menarche  1 years. Age of menopause  109-50 Gravida  4 Irregular periods  Length (months) of breastfeeding  3-6 Maternal age  28-20 Para  4  Other Problems (Darden Palmer, [place]; Anxiety Disorder  Arthritis  Back Pain  Chronic Obstructive Lung Disease  Depression  Diabetes Mellitus  High blood pressure  Thyroid Disease     Review of Systems Skin Not Present- Change in Wart/Mole, Dryness, Hives, Jaundice, New Lesions, Non-Healing Wounds, Rash and Ulcer. HEENT Present- Seasonal Allergies and Wears glasses/contact lenses. Not  Present- Earache, Hearing Loss, Hoarseness, Nose Bleed, Oral Ulcers, Ringing in the Ears, Sinus Pain, Sore Throat, Visual Disturbances and Yellow Eyes. Breast Not Present- Breast Mass, Breast Pain, Nipple Discharge and Skin Changes. Cardiovascular Present- Swelling of Extremities. Not Present- Chest Pain, Difficulty Breathing Lying Down, Leg Cramps, Palpitations, Rapid Heart Rate and Shortness of Breath. Female Genitourinary Present- Urgency. Not Present- Frequency, Nocturia, Painful Urination and Pelvic Pain. Musculoskeletal Present- Back Pain, Joint Pain, Joint Stiffness and Muscle Pain. Not Present- Muscle Weakness and Swelling of Extremities. Neurological Present- Headaches, Numbness and Tingling. Not Present- Decreased Memory, Fainting, Seizures, Tremor, Trouble walking and Weakness. Psychiatric Present- Depression. Not Present- Anxiety, Bipolar, Change in Sleep Pattern, Fearful and Frequent crying. Endocrine Present- Hair Changes and Hot flashes. Not Present- Cold Intolerance, Excessive Hunger, Heat Intolerance and New Diabetes.  Vitals   Weight: 308.38 lb Height: 68in Body Surface Area: 2.46 m Body Mass Index: 46.89 kg/m  Temp.: 97.67F  Pulse: 114 (Regular)  P.OX: 87% (Room air) BP: 122/82(Sitting, Left Arm, Standard)    Physical Exam  The physical exam findings are as follows: Note: On exam, she is well-appearing  Breast were normal in appearance bilaterally. The nipple areolar complexes are normal. I cannot elicit nipple discharge today. There are no palpable masses. There is no axillary adenopathy.    Assessment & Plan  PAPILLOMA OF LEFT BREAST (D24.2)  Impression: I reviewed the patients notes and the electronic medical records. I reviewed her pathology results, mammogram, and ultrasound. She has left breast bloody nipple discharge with 2 separate rectal papillomas close together. A radioactive seed guided left breast lumpectomy is recommended to remove both  areas. I discussed the  diagnosis with her. I discussed the reasons for surgery which includes removing these areas to prevent further discharge and to rule out malignancy. I discussed the surgical procedure in detail. I discussed the risk which includes but is not limited to bleeding, infection, injury to surrounding structures, the need for further surgery if malignancy is found, cardiopulmonary issues, postoperative recovery, etc. She understands and wishes to proceed with surgery.

## 2020-08-08 ENCOUNTER — Encounter (HOSPITAL_BASED_OUTPATIENT_CLINIC_OR_DEPARTMENT_OTHER): Payer: Self-pay | Admitting: Surgery

## 2020-08-08 ENCOUNTER — Ambulatory Visit (HOSPITAL_BASED_OUTPATIENT_CLINIC_OR_DEPARTMENT_OTHER)
Admission: RE | Admit: 2020-08-08 | Discharge: 2020-08-08 | Disposition: A | Discharge status: Home / Self Care | Payer: 59 | Attending: Surgery | Admitting: Surgery

## 2020-08-08 ENCOUNTER — Ambulatory Visit (HOSPITAL_BASED_OUTPATIENT_CLINIC_OR_DEPARTMENT_OTHER): Payer: 59 | Admitting: Anesthesiology

## 2020-08-08 ENCOUNTER — Other Ambulatory Visit: Payer: Self-pay

## 2020-08-08 ENCOUNTER — Ambulatory Visit
Admission: RE | Admit: 2020-08-08 | Discharge: 2020-08-08 | Disposition: A | Discharge status: Home / Self Care | Payer: 59 | Source: Ambulatory Visit | Attending: Surgery | Admitting: Surgery

## 2020-08-08 ENCOUNTER — Other Ambulatory Visit: Payer: Self-pay | Admitting: Internal Medicine

## 2020-08-08 ENCOUNTER — Encounter (HOSPITAL_BASED_OUTPATIENT_CLINIC_OR_DEPARTMENT_OTHER)
Admission: RE | Disposition: A | Discharge status: Home / Self Care | Payer: Self-pay | Source: Home / Self Care | Attending: Surgery

## 2020-08-08 DIAGNOSIS — D242 Benign neoplasm of left breast: Secondary | ICD-10-CM

## 2020-08-08 DIAGNOSIS — E119 Type 2 diabetes mellitus without complications: Secondary | ICD-10-CM | POA: Insufficient documentation

## 2020-08-08 DIAGNOSIS — Z6841 Body Mass Index (BMI) 40.0 and over, adult: Secondary | ICD-10-CM | POA: Diagnosis not present

## 2020-08-08 DIAGNOSIS — N6489 Other specified disorders of breast: Secondary | ICD-10-CM | POA: Insufficient documentation

## 2020-08-08 HISTORY — DX: Unspecified osteoarthritis, unspecified site: M19.90

## 2020-08-08 HISTORY — DX: Hypothyroidism, unspecified: E03.9

## 2020-08-08 HISTORY — DX: Depression, unspecified: F32.A

## 2020-08-08 HISTORY — DX: Chronic obstructive pulmonary disease, unspecified: J44.9

## 2020-08-08 HISTORY — DX: Essential (primary) hypertension: I10

## 2020-08-08 HISTORY — DX: Benign neoplasm of left breast: D24.2

## 2020-08-08 HISTORY — DX: Nicotine dependence, unspecified, uncomplicated: F17.200

## 2020-08-08 HISTORY — PX: BREAST LUMPECTOMY WITH RADIOACTIVE SEED LOCALIZATION: SHX6424

## 2020-08-08 HISTORY — DX: Anxiety disorder, unspecified: F41.9

## 2020-08-08 HISTORY — DX: Type 2 diabetes mellitus without complications: E11.9

## 2020-08-08 LAB — GLUCOSE, CAPILLARY
Glucose-Capillary: 124 mg/dL — ABNORMAL HIGH (ref 70–99)
Glucose-Capillary: 115 mg/dL — ABNORMAL HIGH (ref 70–99)

## 2020-08-08 SURGERY — BREAST LUMPECTOMY WITH RADIOACTIVE SEED LOCALIZATION
Anesthesia: General | Site: Breast | Laterality: Left

## 2020-08-08 MED ORDER — CEFAZOLIN SODIUM-DEXTROSE 2-4 GM/100ML-% IV SOLN
INTRAVENOUS | Status: AC
  Filled 2020-08-08: qty 100

## 2020-08-08 MED ORDER — BUPIVACAINE HCL (PF) 0.25 % IJ SOLN
INTRAMUSCULAR | Status: DC | PRN
  Administered 2020-08-08: 10 mL

## 2020-08-08 MED ORDER — FENTANYL CITRATE (PF) 100 MCG/2ML IJ SOLN
INTRAMUSCULAR | Status: AC
  Filled 2020-08-08: qty 2

## 2020-08-08 MED ORDER — LIDOCAINE 2% (20 MG/ML) 5 ML SYRINGE
INTRAMUSCULAR | Status: AC
  Filled 2020-08-08: qty 5

## 2020-08-08 MED ORDER — LIDOCAINE 2% (20 MG/ML) 5 ML SYRINGE
INTRAMUSCULAR | Status: DC | PRN
  Administered 2020-08-08: 100 mg via INTRAVENOUS

## 2020-08-08 MED ORDER — CELECOXIB 200 MG PO CAPS
400.0000 mg | ORAL_CAPSULE | ORAL | Status: AC
  Administered 2020-08-08: 400 mg via ORAL

## 2020-08-08 MED ORDER — PROPOFOL 500 MG/50ML IV EMUL
INTRAVENOUS | Status: AC
  Filled 2020-08-08: qty 50

## 2020-08-08 MED ORDER — ACETAMINOPHEN 500 MG PO TABS
ORAL_TABLET | ORAL | Status: AC
  Filled 2020-08-08: qty 2

## 2020-08-08 MED ORDER — MIDAZOLAM HCL 5 MG/5ML IJ SOLN
INTRAMUSCULAR | Status: DC | PRN
  Administered 2020-08-08: 2 mg via INTRAVENOUS

## 2020-08-08 MED ORDER — SUCCINYLCHOLINE CHLORIDE 200 MG/10ML IV SOSY
PREFILLED_SYRINGE | INTRAVENOUS | Status: AC
  Filled 2020-08-08: qty 10

## 2020-08-08 MED ORDER — MEPERIDINE HCL 25 MG/ML IJ SOLN: 6.2500 mg | INTRAMUSCULAR | Status: DC | PRN

## 2020-08-08 MED ORDER — LACTATED RINGERS IV SOLN: INTRAVENOUS | Status: DC

## 2020-08-08 MED ORDER — DEXAMETHASONE SODIUM PHOSPHATE 4 MG/ML IJ SOLN
INTRAMUSCULAR | Status: DC | PRN
  Administered 2020-08-08: 5 mg via INTRAVENOUS

## 2020-08-08 MED ORDER — PROMETHAZINE HCL 25 MG/ML IJ SOLN: 6.2500 mg | INTRAMUSCULAR | Status: DC | PRN

## 2020-08-08 MED ORDER — CHLORHEXIDINE GLUCONATE CLOTH 2 % EX PADS: 6.0000 | MEDICATED_PAD | Freq: Once | CUTANEOUS | Status: DC

## 2020-08-08 MED ORDER — PROPOFOL 10 MG/ML IV BOLUS
INTRAVENOUS | Status: AC
  Filled 2020-08-08: qty 20

## 2020-08-08 MED ORDER — CELECOXIB 200 MG PO CAPS
ORAL_CAPSULE | ORAL | Status: AC
  Filled 2020-08-08: qty 2

## 2020-08-08 MED ORDER — LACTATED RINGERS IV SOLN
INTRAVENOUS | Status: DC | PRN
  Administered 2020-08-08: 1000 mL via INTRAVENOUS

## 2020-08-08 MED ORDER — ACETAMINOPHEN 500 MG PO TABS
1000.0000 mg | ORAL_TABLET | ORAL | Status: AC
  Administered 2020-08-08: 1000 mg via ORAL

## 2020-08-08 MED ORDER — DIPHENHYDRAMINE HCL 50 MG/ML IJ SOLN
INTRAMUSCULAR | Status: AC
  Filled 2020-08-08: qty 1

## 2020-08-08 MED ORDER — GABAPENTIN 300 MG PO CAPS
300.0000 mg | ORAL_CAPSULE | ORAL | Status: AC
  Administered 2020-08-08: 300 mg via ORAL

## 2020-08-08 MED ORDER — GABAPENTIN 300 MG PO CAPS
ORAL_CAPSULE | ORAL | Status: AC
  Filled 2020-08-08: qty 1

## 2020-08-08 MED ORDER — ONDANSETRON HCL 4 MG/2ML IJ SOLN
INTRAMUSCULAR | Status: AC
  Filled 2020-08-08: qty 2

## 2020-08-08 MED ORDER — LIDOCAINE HCL (CARDIAC) PF 100 MG/5ML IV SOSY
PREFILLED_SYRINGE | INTRAVENOUS | Status: DC | PRN
  Administered 2020-08-08: 100 mg via INTRAVENOUS

## 2020-08-08 MED ORDER — DEXAMETHASONE SODIUM PHOSPHATE 10 MG/ML IJ SOLN
INTRAMUSCULAR | Status: AC
  Filled 2020-08-08: qty 1

## 2020-08-08 MED ORDER — MIDAZOLAM HCL 2 MG/2ML IJ SOLN
INTRAMUSCULAR | Status: AC
  Filled 2020-08-08: qty 2

## 2020-08-08 MED ORDER — CEFAZOLIN SODIUM-DEXTROSE 2-4 GM/100ML-% IV SOLN
2.0000 g | INTRAVENOUS | Status: AC
  Administered 2020-08-08: 2 g via INTRAVENOUS

## 2020-08-08 MED ORDER — PROPOFOL 10 MG/ML IV BOLUS
INTRAVENOUS | Status: DC | PRN
  Administered 2020-08-08: 100 mg via INTRAVENOUS
  Administered 2020-08-08: 200 mg via INTRAVENOUS

## 2020-08-08 MED ORDER — PHENYLEPHRINE 40 MCG/ML (10ML) SYRINGE FOR IV PUSH (FOR BLOOD PRESSURE SUPPORT)
PREFILLED_SYRINGE | INTRAVENOUS | Status: AC
  Filled 2020-08-08: qty 10

## 2020-08-08 MED ORDER — EPHEDRINE 5 MG/ML INJ
INTRAVENOUS | Status: AC
  Filled 2020-08-08: qty 10

## 2020-08-08 MED ORDER — FENTANYL CITRATE (PF) 100 MCG/2ML IJ SOLN: 25.0000 ug | INTRAMUSCULAR | Status: DC | PRN

## 2020-08-08 MED ORDER — FENTANYL CITRATE (PF) 100 MCG/2ML IJ SOLN
INTRAMUSCULAR | Status: DC | PRN
  Administered 2020-08-08: 50 ug via INTRAVENOUS

## 2020-08-08 MED ORDER — TRAMADOL HCL 50 MG PO TABS
50.0000 mg | ORAL_TABLET | Freq: Four times a day (QID) | ORAL | 0 refills | Status: DC | PRN
Start: 2020-08-08 — End: 2021-05-06

## 2020-08-08 SURGICAL SUPPLY — 48 items
ADH SKN CLS APL DERMABOND .7 (GAUZE/BANDAGES/DRESSINGS) ×1
APL PRP STRL LF DISP 70% ISPRP (MISCELLANEOUS) ×1
APPLIER CLIP 9.375 MED OPEN (MISCELLANEOUS)
APR CLP MED 9.3 20 MLT OPN (MISCELLANEOUS)
BINDER BREAST 3XL (GAUZE/BANDAGES/DRESSINGS) IMPLANT
BINDER BREAST LRG (GAUZE/BANDAGES/DRESSINGS) IMPLANT
BINDER BREAST MEDIUM (GAUZE/BANDAGES/DRESSINGS) IMPLANT
BINDER BREAST XLRG (GAUZE/BANDAGES/DRESSINGS) IMPLANT
BINDER BREAST XXLRG (GAUZE/BANDAGES/DRESSINGS) IMPLANT
BLADE SURG 15 STRL LF DISP TIS (BLADE) ×1 IMPLANT
BLADE SURG 15 STRL SS (BLADE) ×2
CANISTER SUC SOCK COL 7IN (MISCELLANEOUS) IMPLANT
CANISTER SUCT 1200ML W/VALVE (MISCELLANEOUS) IMPLANT
CHLORAPREP W/TINT 26 (MISCELLANEOUS) ×2 IMPLANT
CLIP APPLIE 9.375 MED OPEN (MISCELLANEOUS) IMPLANT
COVER BACK TABLE 60X90IN (DRAPES) ×2 IMPLANT
COVER MAYO STAND STRL (DRAPES) ×2 IMPLANT
COVER PROBE W GEL 5X96 (DRAPES) ×2 IMPLANT
COVER WAND RF STERILE (DRAPES) IMPLANT
DECANTER SPIKE VIAL GLASS SM (MISCELLANEOUS) IMPLANT
DERMABOND ADVANCED (GAUZE/BANDAGES/DRESSINGS) ×1
DERMABOND ADVANCED .7 DNX12 (GAUZE/BANDAGES/DRESSINGS) ×1 IMPLANT
DRAPE LAPAROSCOPIC ABDOMINAL (DRAPES) ×2 IMPLANT
DRAPE UTILITY XL STRL (DRAPES) ×2 IMPLANT
ELECT REM PT RETURN 9FT ADLT (ELECTROSURGICAL) ×2
ELECTRODE REM PT RTRN 9FT ADLT (ELECTROSURGICAL) ×1 IMPLANT
GAUZE SPONGE 4X4 12PLY STRL LF (GAUZE/BANDAGES/DRESSINGS) IMPLANT
GLOVE SURG SIGNA 7.5 PF LTX (GLOVE) ×2 IMPLANT
GOWN STRL REUS W/ TWL LRG LVL3 (GOWN DISPOSABLE) ×1 IMPLANT
GOWN STRL REUS W/ TWL XL LVL3 (GOWN DISPOSABLE) ×1 IMPLANT
GOWN STRL REUS W/TWL LRG LVL3 (GOWN DISPOSABLE) ×2
GOWN STRL REUS W/TWL XL LVL3 (GOWN DISPOSABLE) ×2
KIT MARKER MARGIN INK (KITS) ×2 IMPLANT
NEEDLE HYPO 25X1 1.5 SAFETY (NEEDLE) ×2 IMPLANT
NS IRRIG 1000ML POUR BTL (IV SOLUTION) IMPLANT
PACK BASIN DAY SURGERY FS (CUSTOM PROCEDURE TRAY) ×2 IMPLANT
PENCIL SMOKE EVACUATOR (MISCELLANEOUS) ×2 IMPLANT
SLEEVE SCD COMPRESS KNEE MED (MISCELLANEOUS) ×2 IMPLANT
SPONGE LAP 4X18 RFD (DISPOSABLE) ×2 IMPLANT
SUT MNCRL AB 4-0 PS2 18 (SUTURE) ×2 IMPLANT
SUT SILK 2 0 SH (SUTURE) IMPLANT
SUT VIC AB 3-0 SH 27 (SUTURE) ×2
SUT VIC AB 3-0 SH 27X BRD (SUTURE) ×1 IMPLANT
SYR CONTROL 10ML LL (SYRINGE) ×2 IMPLANT
TOWEL GREEN STERILE FF (TOWEL DISPOSABLE) ×2 IMPLANT
TRAY FAXITRON CT DISP (TRAY / TRAY PROCEDURE) ×2 IMPLANT
TUBE CONNECTING 20X1/4 (TUBING) IMPLANT
YANKAUER SUCT BULB TIP NO VENT (SUCTIONS) IMPLANT

## 2020-08-08 NOTE — Anesthesia Preprocedure Evaluation (Addendum)
Anesthesia Evaluation  Patient identified by MRN, date of birth, ID band Patient awake    Reviewed: Allergy & Precautions, NPO status , Patient's Chart, lab work & pertinent test results  Airway Mallampati: II  TM Distance: >3 FB Neck ROM: Full    Dental  (+) Poor Dentition, Dental Advisory Given, Chipped,    Pulmonary COPD, Current Smoker,    Pulmonary exam normal breath sounds clear to auscultation       Cardiovascular hypertension, Pt. on medications  Rhythm:Regular Rate:Normal     Neuro/Psych PSYCHIATRIC DISORDERS Anxiety Depression negative neurological ROS     GI/Hepatic negative GI ROS, Neg liver ROS,   Endo/Other  diabetesHypothyroidism Morbid obesity  Renal/GU negative Renal ROS     Musculoskeletal  (+) Arthritis ,   Abdominal (+) + obese,   Peds  Hematology negative hematology ROS (+)   Anesthesia Other Findings   Reproductive/Obstetrics negative OB ROS                           Anesthesia Physical Anesthesia Plan  ASA: III  Anesthesia Plan: General   Post-op Pain Management:    Induction: Intravenous  PONV Risk Score and Plan: 3 and Ondansetron, Dexamethasone, Treatment may vary due to age or medical condition, Midazolam and Scopolamine patch - Pre-op  Airway Management Planned: LMA  Additional Equipment: None  Intra-op Plan:   Post-operative Plan: Extubation in OR  Informed Consent: I have reviewed the patients History and Physical, chart, labs and discussed the procedure including the risks, benefits and alternatives for the proposed anesthesia with the patient or authorized representative who has indicated his/her understanding and acceptance.     Dental advisory given  Plan Discussed with: CRNA  Anesthesia Plan Comments:        Anesthesia Quick Evaluation

## 2020-08-08 NOTE — Transfer of Care (Signed)
Immediate Anesthesia Transfer of Care Note  Patient: Donna Whitaker  Procedure(s) Performed: LEFT BREAST LUMPECTOMY WITH RADIOACTIVE SEED LOCALIZATION (Left Breast)  Patient Location: PACU  Anesthesia Type:General  Level of Consciousness: awake, drowsy and patient cooperative  Airway & Oxygen Therapy: Patient Spontanous Breathing and Patient connected to nasal cannula oxygen  Post-op Assessment: Report given to RN and Post -op Vital signs reviewed and stable  Post vital signs: Reviewed and stable  Last Vitals:  Vitals Value Taken Time  BP    Temp    Pulse    Resp 26 08/08/20 1244  SpO2    Vitals shown include unvalidated device data.  Last Pain:  Vitals:   08/08/20 1143  PainSc: 0-No pain         Complications: No complications documented.

## 2020-08-08 NOTE — Anesthesia Postprocedure Evaluation (Signed)
Anesthesia Post Note  Patient: Donna Whitaker  Procedure(s) Performed: LEFT BREAST LUMPECTOMY WITH RADIOACTIVE SEED LOCALIZATION (Left Breast)     Patient location during evaluation: PACU Anesthesia Type: General Level of consciousness: sedated and patient cooperative Pain management: pain level controlled Vital Signs Assessment: post-procedure vital signs reviewed and stable Respiratory status: spontaneous breathing Cardiovascular status: stable Anesthetic complications: no   No complications documented.  Last Vitals:  Vitals:   08/08/20 1300 08/08/20 1350  BP: (!) 106/42 (!) 102/59  Pulse:  64  Resp:  16  Temp:  36.6 C  SpO2:  92%    Last Pain:  Vitals:   08/08/20 1350  PainSc: 0-No pain                 Nolon Nations

## 2020-08-08 NOTE — Interval H&P Note (Signed)
History and Physical Interval Note:no change in H and P  08/08/2020 11:18 AM  Donna Whitaker  has presented today for surgery, with the diagnosis of LEFT BREAST INTRADUCTAL PAPILLOMA.  The various methods of treatment have been discussed with the patient and family. After consideration of risks, benefits and other options for treatment, the patient has consented to  Procedure(s) with comments: LEFT BREAST LUMPECTOMY WITH RADIOACTIVE SEED LOCALIZATION (Left) - LMA as a surgical intervention.  The patient's history has been reviewed, patient examined, no change in status, stable for surgery.  I have reviewed the patient's chart and labs.  Questions were answered to the patient's satisfaction.     Coralie Keens

## 2020-08-08 NOTE — Discharge Instructions (Signed)
[place] Office Phone Number [phone]  BREAST BIOPSY/ PARTIAL MASTECTOMY: POST OP INSTRUCTIONS  Always review your discharge instruction sheet given to you by the facility where your surgery was performed.  IF YOU HAVE DISABILITY OR FAMILY LEAVE FORMS, YOU MUST BRING THEM TO THE OFFICE FOR PROCESSING.  DO NOT GIVE THEM TO YOUR DOCTOR.  1. A prescription for pain medication may be given to you upon discharge.  Take your pain medication as prescribed, if needed.  If narcotic pain medicine is not needed, then you may take acetaminophen (Tylenol) or ibuprofen (Advil) as needed. 2. Take your usually prescribed medications unless otherwise directed 3. If you need a refill on your pain medication, please contact your pharmacy.  They will contact our office to request authorization.  Prescriptions will not be filled after 5pm or on week-ends. 4. You should eat very light the first 24 hours after surgery, such as soup, crackers, pudding, etc.  Resume your normal diet the day after surgery. 5. Most patients will experience some swelling and bruising in the breast.  Ice packs and a good support bra will help.  Swelling and bruising can take several days to resolve.  6. It is common to experience some constipation if taking pain medication after surgery.  Increasing fluid intake and taking a stool softener will usually help or prevent this problem from occurring.  A mild laxative (Milk of Magnesia or Miralax) should be taken according to package directions if there are no bowel movements after 48 hours. 7. Unless discharge instructions indicate otherwise, you may remove your bandages 24-48 hours after surgery, and you may shower at that time.  You may have steri-strips (small skin tapes) in place directly over the incision.  These strips should be left on the skin for 7-10 days.  If your surgeon used skin glue on the incision, you may shower in 24 hours.  The glue will flake off over the  next 2-3 weeks.  Any sutures or staples will be removed at the office during your follow-up visit. 8. ACTIVITIES:  You may resume regular daily activities (gradually increasing) beginning the next day.  Wearing a good support bra or sports bra minimizes pain and swelling.  You may have sexual intercourse when it is comfortable. a. You may drive when you no longer are taking prescription pain medication, you can comfortably wear a seatbelt, and you can safely maneuver your car and apply brakes. b. RETURN TO WORK:  ______________________________________________________________________________________ 9. You should see your doctor in the office for a follow-up appointment approximately two weeks after your surgery.  Your doctor's nurse will typically make your follow-up appointment when she calls you with your pathology report.  Expect your pathology report 2-3 business days after your surgery.  You may call to check if you do not hear from [place] after three days. 10. OTHER INSTRUCTIONS:ok to shower starting tomorrow 11. Ice pack, tylenol, and ibuprofen also for pain 12. No vigorous activity for one week _______________________________________________________________________________________________ _____________________________________________________________________________________________________________________________________ _____________________________________________________________________________________________________________________________________ _____________________________________________________________________________________________________________________________________  WHEN TO CALL YOUR DOCTOR: 1. Fever over 101.0 2. Nausea and/or vomiting. 3. Extreme swelling or bruising. 4. Continued bleeding from incision. 5. Increased pain, redness, or drainage from the incision.  The clinic staff is available to answer your questions during regular business hours.  Please don't hesitate to  call and ask to speak to one of the nurses for clinical concerns.  If you have a medical emergency, go to the nearest emergency room or call 911.  A  surgeon from [facility] is always on call at the hospital.  For further questions, please visit [email or web address]     Post Anesthesia Home Care Instructions  Activity: Get plenty of rest for the remainder of the day. A responsible individual must stay with you for 24 hours following the procedure.  For the next 24 hours, DO NOT: -Drive a car -Paediatric nurse -Drink alcoholic beverages -Take any medication unless instructed by your physician -Make any legal decisions or sign important papers.  Meals: Start with liquid foods such as gelatin or soup. Progress to regular foods as tolerated. Avoid greasy, spicy, heavy foods. If nausea and/or vomiting occur, drink only clear liquids until the nausea and/or vomiting subsides. Call your physician if vomiting continues.  Special Instructions/Symptoms: Your throat may feel dry or sore from the anesthesia or the breathing tube placed in your throat during surgery. If this causes discomfort, gargle with warm salt water. The discomfort should disappear within 24 hours.  If you had a scopolamine patch placed behind your ear for the management of post- operative nausea and/or vomiting:  1. The medication in the patch is effective for 72 hours, after which it should be removed.  Wrap patch in a tissue and discard in the trash. Wash hands thoroughly with soap and water. 2. You may remove the patch earlier than 72 hours if you experience unpleasant side effects which may include dry mouth, dizziness or visual disturbances. 3. Avoid touching the patch. Wash your hands with soap and water after contact with the patch.    No tylenol today until after 5:30pm if needed.  No ibuprofen today until after 7:30pm if needed.

## 2020-08-08 NOTE — Anesthesia Procedure Notes (Signed)
Procedure Name: LMA Insertion Date/Time: 08/08/2020 12:03 PM Performed by: Willa Frater, CRNA Pre-anesthesia Checklist: Patient identified, Emergency Drugs available, Suction available and Patient being monitored Patient Re-evaluated:Patient Re-evaluated prior to induction Oxygen Delivery Method: Circle system utilized Preoxygenation: Pre-oxygenation with 100% oxygen Induction Type: IV induction Ventilation: Mask ventilation without difficulty LMA: LMA inserted LMA Size: 5.0 Number of attempts: 1 Airway Equipment and Method: Bite block Placement Confirmation: positive ETCO2 Tube secured with: Tape Dental Injury: Teeth and Oropharynx as per pre-operative assessment

## 2020-08-08 NOTE — Op Note (Signed)
Excisional Breast Biopsy  Indications: This patient presents with history of left breast mass, biopsy proven two foci of intraductal papilloma 0.5cm apart, within the same duct.   Pre-operative Diagnosis: left breast mass, two intraductal papillomas   Post-operative Diagnosis: same  Surgeon: Coralie Keens, MD   Anesthesia: General LMA anesthesia  ASA Class: 2  Procedure Details  The patient was seen in the Holding Room. The risks, benefits, complications, treatment options, and expected outcomes were discussed with the patient. The possibilities of reaction to medication, pulmonary aspiration, bleeding, infection, the need for additional procedures, failure to diagnose a condition, and creating a complication requiring transfusion or operation were discussed with the patient. The patient concurred with the proposed plan, giving informed consent.  The site of surgery properly noted/marked. The patient was taken to Operating Room identified, and the procedure verified as Breast Excisional Biopsy. A Time Out was held and the above information confirmed.  After induction of anesthesia, the left  breast and chest were prepped and draped in standard fashion. The lumpectomy was performed by creating an oblique incision over the inner medial left areola of the breast.  Dissection was carried down using the neoprobe to localize the seed within the two intraductal papillomas. The specimen was removed with electocautery. The lumpectomy specimen was painted with the inking kit. Hemostasis was achieved.  The wound was then closed with a 3-0 Vicryl interrupted deep dermal stitch and a 4-0 Monocryl subcuticular closure in layers. Dermabond was then applied.    Sterile dressings were applied. At the end of the operation, all sponge, instrument, and needle counts were correct.  Findings: Lumpectomy specimen with seed and clip within specimen on radiograph  Estimated Blood Loss:  Minimal         Specimens:  Left lumpectomy         Complications:  None; patient tolerated the procedure well.         Disposition: PACU - hemodynamically stable.         Condition: stable

## 2020-08-09 ENCOUNTER — Encounter (HOSPITAL_BASED_OUTPATIENT_CLINIC_OR_DEPARTMENT_OTHER): Payer: Self-pay | Admitting: Surgery

## 2020-08-12 LAB — SURGICAL PATHOLOGY

## 2020-09-22 ENCOUNTER — Other Ambulatory Visit: Payer: Self-pay | Admitting: Internal Medicine

## 2020-10-10 ENCOUNTER — Other Ambulatory Visit: Payer: Self-pay | Admitting: Internal Medicine

## 2020-10-22 ENCOUNTER — Other Ambulatory Visit: Payer: Self-pay | Admitting: Internal Medicine

## 2020-10-25 ENCOUNTER — Other Ambulatory Visit: Payer: Self-pay | Admitting: Internal Medicine

## 2020-11-07 ENCOUNTER — Other Ambulatory Visit: Payer: Self-pay | Admitting: Internal Medicine

## 2020-11-16 ENCOUNTER — Other Ambulatory Visit: Payer: Self-pay | Admitting: Internal Medicine

## 2020-12-13 ENCOUNTER — Other Ambulatory Visit: Payer: Self-pay | Admitting: Internal Medicine

## 2020-12-26 ENCOUNTER — Other Ambulatory Visit: Payer: Self-pay | Admitting: Internal Medicine

## 2021-01-11 ENCOUNTER — Other Ambulatory Visit: Payer: Self-pay | Admitting: Internal Medicine

## 2021-01-16 ENCOUNTER — Other Ambulatory Visit: Payer: Self-pay | Admitting: Internal Medicine

## 2021-02-10 ENCOUNTER — Other Ambulatory Visit: Payer: Self-pay | Admitting: Internal Medicine

## 2021-03-28 ENCOUNTER — Other Ambulatory Visit: Payer: Self-pay | Admitting: Internal Medicine

## 2021-04-29 ENCOUNTER — Other Ambulatory Visit: Payer: Self-pay | Admitting: Family

## 2021-05-03 ENCOUNTER — Other Ambulatory Visit: Payer: Self-pay | Admitting: Internal Medicine

## 2021-05-03 NOTE — Telephone Encounter (Signed)
Requested medication (s) are due for refill today: yes  Requested medication (s) are on the active medication list: yes  Last refill:  03/30/21 #30  Future visit scheduled: yes  Notes to clinic:  overdue for office visit   Requested Prescriptions  Pending Prescriptions Disp Refills   losartan (COZAAR) 50 MG tablet [Pharmacy Med Name: Losartan Potassium 50 MG Oral Tablet] 30 tablet 0    Sig: Take 1 tablet by mouth once daily      Cardiovascular:  Angiotensin Receptor Blockers Failed - 05/03/2021  9:45 AM      Failed - Cr in normal range and within 180 days    Creatinine, Ser  Date Value Ref Range Status  08/05/2020 0.78 0.44 - 1.00 mg/dL Final          Failed - K in normal range and within 180 days    Potassium  Date Value Ref Range Status  08/05/2020 4.9 3.5 - 5.1 mmol/L Final          Failed - Valid encounter within last 6 months    Recent Outpatient Visits   None     Future Appointments             In 3 days Donna Whitaker, Donna Keens, NP [facility] [place] - Patient is not pregnant      Passed - Last BP in normal range    BP Readings from Last 1 Encounters:  08/08/20 (!) 102/59

## 2021-05-06 ENCOUNTER — Other Ambulatory Visit: Payer: Self-pay

## 2021-05-06 ENCOUNTER — Ambulatory Visit (INDEPENDENT_AMBULATORY_CARE_PROVIDER_SITE_OTHER): Payer: 59 | Admitting: Internal Medicine

## 2021-05-06 ENCOUNTER — Encounter: Payer: Self-pay | Admitting: Internal Medicine

## 2021-05-06 VITALS — BP 133/77 | HR 84 | Temp 97.0°F | Resp 19 | Ht 69.0 in | Wt 310.8 lb

## 2021-05-06 DIAGNOSIS — I10 Essential (primary) hypertension: Secondary | ICD-10-CM

## 2021-05-06 DIAGNOSIS — F5101 Primary insomnia: Secondary | ICD-10-CM

## 2021-05-06 DIAGNOSIS — E039 Hypothyroidism, unspecified: Secondary | ICD-10-CM | POA: Diagnosis not present

## 2021-05-06 DIAGNOSIS — M1990 Unspecified osteoarthritis, unspecified site: Secondary | ICD-10-CM | POA: Insufficient documentation

## 2021-05-06 DIAGNOSIS — F419 Anxiety disorder, unspecified: Secondary | ICD-10-CM

## 2021-05-06 DIAGNOSIS — M159 Polyosteoarthritis, unspecified: Secondary | ICD-10-CM

## 2021-05-06 DIAGNOSIS — F32A Depression, unspecified: Secondary | ICD-10-CM

## 2021-05-06 DIAGNOSIS — E66813 Obesity, class 3: Secondary | ICD-10-CM | POA: Insufficient documentation

## 2021-05-06 DIAGNOSIS — J432 Centrilobular emphysema: Secondary | ICD-10-CM

## 2021-05-06 DIAGNOSIS — J449 Chronic obstructive pulmonary disease, unspecified: Secondary | ICD-10-CM | POA: Insufficient documentation

## 2021-05-06 DIAGNOSIS — E6609 Other obesity due to excess calories: Secondary | ICD-10-CM | POA: Insufficient documentation

## 2021-05-06 DIAGNOSIS — E119 Type 2 diabetes mellitus without complications: Secondary | ICD-10-CM

## 2021-05-06 DIAGNOSIS — M8949 Other hypertrophic osteoarthropathy, multiple sites: Secondary | ICD-10-CM

## 2021-05-06 MED ORDER — HYDROXYZINE PAMOATE 25 MG PO CAPS: 25.0000 mg | ORAL_CAPSULE | Freq: Three times a day (TID) | ORAL | 0 refills | Status: DC | PRN

## 2021-05-06 NOTE — Assessment & Plan Note (Signed)
Will consider starting daily therapy Continue Albuterol at this time Encouraged smoking cessation

## 2021-05-06 NOTE — Progress Notes (Signed)
Subjective:    Patient ID: Donna Whitaker, female    DOB: 10/02/1971, 50 y.o.   MRN: [id number]  HPI  Pt presents to the clinic today for follow up of chronic conditions.  Hypothyroidism: She denies any issues on her current dose of Levothyroxine. She does not follow with endocrinology.  Insomnia: She has difficulty staying asleep. She is taking Hydroxyzine at bedtime with some relief of symptoms. There is no sleep study on file.   Anxiety and Depression: Chronic secondary to family issues. She is taking Sertraline and Hydroxyzine as prescribed. She is not currently seeing a therapist. She denies SI/HI.  OA: Mainly in her back and knees. She is taking Ibuprofen OTC as needed with some relief of symptoms. She does not follow with orthopedics.  COPD: She reports chronic cough but denies shortness of breath. She uses Albuterol inhaler only as needed. There are no PFT's on file. She does continue to smoke.  HTN: Her BP today is 133/77. She is taking Losartan and Furosemide as prescribed. ECG from 07/2020 reviewed.  DM 2: Her last A1C was 7.6, 04/2020. She is only taking Glipizide 1 x day instead of 2 x day because she feels like it causes her sugars to run too low. She reports occasionally her sugars are < 80. She has not had a recent eye exam. She does not check her feet routinely. She declines flu, pneumonia and covid vaccines.   Review of Systems     Past Medical History:  Diagnosis Date   Allergy    Anxiety    Arthritis    knees, back   COPD (chronic obstructive pulmonary disease) (HCC)    uses albuterol as needed   Depression    Diabetes mellitus without complication (HCC)    Heavy smoker    2ppd   History of chicken pox    Hypertension    Hypothyroidism    Intraductal papilloma of breast, left     Current Outpatient Medications  Medication Sig Dispense Refill   albuterol (PROVENTIL HFA;VENTOLIN HFA) 108 (90 Base) MCG/ACT inhaler Inhale 2 puffs into the lungs every 6  (six) hours as needed for wheezing or shortness of breath. 1 Inhaler 0   Cholecalciferol (EQL VITAMIN D3) 50 MCG (2000 UT) CAPS Take by mouth.     fexofenadine (ALLEGRA) 180 MG tablet Take 180 mg by mouth daily.     furosemide (LASIX) 20 MG tablet TAKE 1 TABLET (20 MG TOTAL) BY MOUTH DAILY. SCHEDULE PHYSICAL 90 tablet 0   glipiZIDE (GLUCOTROL) 5 MG tablet TAKE 1 TABLET (5 MG TOTAL) BY MOUTH 2 (TWO) TIMES DAILY BEFORE A MEAL. 180 tablet 0   hydrOXYzine (ATARAX/VISTARIL) 10 MG tablet TAKE 1 TABLET BY MOUTH EVERY DAY AS NEEDED 90 tablet 0   ibuprofen (ADVIL,MOTRIN) 200 MG tablet Take 800 mg by mouth daily as needed.     Lancets (ONETOUCH ULTRASOFT) lancets 1 each by Other route in the morning and at bedtime. 200 each 1   levothyroxine (SYNTHROID) 112 MCG tablet TAKE 1 TABLET (112 MCG TOTAL) BY MOUTH DAILY. SCHEDULE PHYSICAL EXAM 90 tablet 0   losartan (COZAAR) 50 MG tablet Take 1 tablet by mouth once daily 30 tablet 0   Melatonin 5 MG CAPS Take by mouth.     ONETOUCH ULTRA test strip 1 EACH BY OTHER ROUTE IN THE MORNING AND AT BEDTIME. 200 strip 1   sertraline (ZOLOFT) 25 MG tablet TAKE 1 TABLET BY MOUTH EVERYDAY AT BEDTIME 90 tablet 0  traMADol (ULTRAM) 50 MG tablet Take 1 tablet (50 mg total) by mouth every 6 (six) hours as needed. 20 tablet 0   No current facility-administered medications for this visit.    Allergies  Allergen Reactions   Betadine [Povidone Iodine] Other (See Comments)    Skin irritation   Vicodin [Hydrocodone-Acetaminophen] Palpitations    Chest pain    Family History  Problem Relation Age of Onset   Arthritis Mother    Hyperlipidemia Mother    Alcohol abuse Father    Hyperlipidemia Father    Heart disease Father    Hypertension Father    Arthritis Maternal Grandmother    Hyperlipidemia Maternal Grandmother    Heart disease Maternal Grandmother    Hypertension Maternal Grandmother    Diabetes Maternal Grandmother    Hyperlipidemia Maternal Grandfather     Hypertension Maternal Grandfather    Hyperlipidemia Paternal Grandmother    Hypertension Paternal Grandmother    Stomach cancer Paternal Grandmother    Alcohol abuse Paternal Grandfather    Hyperlipidemia Paternal Grandfather    Heart disease Paternal Grandfather    Hypertension Paternal Grandfather     Social History   Socioeconomic History   Marital status: Married    Spouse name: Not on file   Number of children: Not on file   Years of education: Not on file   Highest education level: Not on file  Occupational History   Not on file  Tobacco Use   Smoking status: Every Day    Packs/day: 2.00    Years: 35.00    Pack years: 70.00    Types: Cigarettes   Smokeless tobacco: Never  Substance and Sexual Activity   Alcohol use: Yes    Comment: occasional   Drug use: No   Sexual activity: Yes    Birth control/protection: Post-menopausal, Surgical    Comment: BTL  Other Topics Concern   Not on file  Social History Narrative   Not on file   Social Determinants of Health   Financial Resource Strain: Not on file  Food Insecurity: Not on file  Transportation Needs: Not on file  Physical Activity: Not on file  Stress: Not on file  Social Connections: Not on file  Intimate Partner Violence: Not on file     Constitutional: Denies fever, malaise, fatigue, headache or abrupt weight changes.  HEENT: Pt reports post nasal drip. Denies eye pain, eye redness, ear pain, ringing in the ears, wax buildup, runny nose, nasal congestion, bloody nose, or sore throat. Respiratory: Pt reports chronic cough. Denies difficulty breathing, shortness of breath, or sputum production.   Cardiovascular: Denies chest pain, chest tightness, palpitations or swelling in the hands or feet.  Gastrointestinal: Denies abdominal pain, bloating, constipation, diarrhea or blood in the stool.  GU: Denies urgency, frequency, pain with urination, burning sensation, blood in urine, odor or  discharge. Musculoskeletal: Pt reports joint pain. Denies decrease in range of motion, difficulty with gait, muscle pain or joint swelling.  Skin: Denies redness, rashes, lesions or ulcercations.  Neurological: Pt reports insomnia. Denies dizziness, difficulty with memory, difficulty with speech or problems with balance and coordination.  Psych: Pt reports anxiety and depression. Denies SI/HI.  No other specific complaints in a complete review of systems (except as listed in HPI above).  Objective:   Physical Exam  BP 133/77 (BP Location: Right Arm, Patient Position: Sitting, Cuff Size: Large)   Pulse 84   Temp (!) 97 F (36.1 C) (Temporal)   Resp 19  Ht 5\' 9"  (1.753 m)   Wt (!) 310 lb 12.8 oz (141 kg)   LMP 05/19/2016 Comment: BTL  SpO2 97%   BMI 45.90 kg/m   Wt Readings from Last 3 Encounters:  08/08/20 296 lb 1.2 oz (134.3 kg)  06/18/20 (!) 309 lb (140.2 kg)  04/30/20 (!) 317 lb (143.8 kg)    General: Appears her stated age, obese, in NAD. Skin: Warm, dry and intact. No ulcerations noted. HEENT: Head: normal shape and size; Eyes: sclera white and EOMs intact;  Neck:  Neck supple, trachea midline. No masses, lumps or thyromegaly present.  Cardiovascular: Normal rate and rhythm. S1,S2 noted.  No murmur, rubs or gallops noted. No JVD or BLE edema. No carotid bruits noted. Pulmonary/Chest: Normal effort and positive vesicular breath sounds. No respiratory distress. No wheezes, rales or ronchi noted.  Musculoskeletal: Difficulty getting from a sitting to a standing position. No difficulty with gait.  Neurological: Alert and oriented.  Psychiatric: Mood and affect normal. Tearul. Judgment and thought content normal.    BMET    Component Value Date/Time   NA 140 08/05/2020 0820   K 4.9 08/05/2020 0820   CL 101 08/05/2020 0820   CO2 30 08/05/2020 0820   GLUCOSE 127 (H) 08/05/2020 0820   BUN 9 08/05/2020 0820   CREATININE 0.78 08/05/2020 0820   CALCIUM 9.8 08/05/2020 0820    GFRNONAA >60 08/05/2020 0820    Lipid Panel     Component Value Date/Time   CHOL 151 04/30/2020 0921   TRIG 103.0 04/30/2020 0921   HDL 44.20 04/30/2020 0921   CHOLHDL 3 04/30/2020 0921   VLDL 20.6 04/30/2020 0921   LDLCALC 86 04/30/2020 0921    CBC    Component Value Date/Time   WBC 7.3 11/23/2017 0918   RBC 5.06 11/23/2017 0918   HGB 15.8 (H) 11/23/2017 0918   HCT 46.4 (H) 11/23/2017 0918   PLT 256.0 11/23/2017 0918   MCV 91.7 11/23/2017 0918   MCHC 34.0 11/23/2017 0918   RDW 13.4 11/23/2017 0918    Hgb A1C Lab Results  Component Value Date   HGBA1C 7.6 (H) 04/30/2020            Assessment & Plan:   Webb Silversmith, NP This visit occurred during the SARS-CoV-2 public health emergency.  Safety protocols were in place, including screening questions prior to the visit, additional usage of staff PPE, and extensive cleaning of exam room while observing appropriate contact time as indicated for disinfecting solutions.

## 2021-05-06 NOTE — Assessment & Plan Note (Signed)
Controlled on Losartan and Furosemide CMET today Reinforced DASH diet and exercise for weight loss

## 2021-05-06 NOTE — Assessment & Plan Note (Signed)
Encouraged diet and exercise for weight loss ?

## 2021-05-06 NOTE — Assessment & Plan Note (Signed)
Persistent Continue Sertraline, will need to consider dose adjustment Increase Hydroxyzine to 10 mg TID prn Support offered

## 2021-05-06 NOTE — Assessment & Plan Note (Signed)
Encouraged weight loss as this can help reduce reflux symptoms Continue Ibuprofen OTC as needed

## 2021-05-06 NOTE — Assessment & Plan Note (Signed)
Increase Hydroxyzine to 25 mg at bedtime

## 2021-05-06 NOTE — Patient Instructions (Signed)
https://www.diabeteseducator.org/docs/default-source/living-with-diabetes/conquering-the-grocery-store-v1.pdf?sfvrsn=4">  Carbohydrate Counting for Diabetes Mellitus, Adult Carbohydrate counting is a method of keeping track of how many carbohydrates you eat. Eating carbohydrates naturally increases the amount of sugar (glucose) in the blood. Counting how many carbohydrates you eat improves your bloodglucose control, which helps you manage your diabetes. It is important to know how many carbohydrates you can safely have in each meal. This is different for every person. A dietitian can help you make a meal plan and calculate how many carbohydrates you should have at each meal andsnack. What foods contain carbohydrates? Carbohydrates are found in the following foods: Grains, such as breads and cereals. Dried beans and soy products. Starchy vegetables, such as potatoes, peas, and corn. Fruit and fruit juices. Milk and yogurt. Sweets and snack foods, such as cake, cookies, candy, chips, and soft drinks. How do I count carbohydrates in foods? There are two ways to count carbohydrates in food. You can read food labels or learn standard serving sizes of foods. You can use either of the methods or acombination of both. Using the Nutrition Facts label The Nutrition Facts list is included on the labels of almost all packaged foods and beverages in the [place] It includes: The serving size. Information about nutrients in each serving, including the grams (g) of carbohydrate per serving. To use the Nutrition Facts: Decide how many servings you will have. Multiply the number of servings by the number of carbohydrates per serving. The resulting number is the total amount of carbohydrates that you will be having. Learning the standard serving sizes of foods When you eat carbohydrate foods that are not packaged or do not include Nutrition Facts on the label, you need to measure the servings in order to count the  amount of carbohydrates. Measure the foods that you will eat with a food scale or measuring cup, if needed. Decide how many standard-size servings you will eat. Multiply the number of servings by 15. For foods that contain carbohydrates, one serving equals 15 g of carbohydrates. For example, if you eat 2 cups or 10 oz (300 g) of strawberries, you will have eaten 2 servings and 30 g of carbohydrates (2 servings x 15 g = 30 g). For foods that have more than one food mixed, such as soups and casseroles, you must count the carbohydrates in each food that is included. The following list contains standard serving sizes of common carbohydrate-rich foods. Each of these servings has about 15 g of carbohydrates: 1 slice of bread. 1 six-inch (15 cm) tortilla. ? cup or 2 oz (53 g) cooked rice or pasta.  cup or 3 oz (85 g) cooked or canned, drained and rinsed beans or lentils.  cup or 3 oz (85 g) starchy vegetable, such as peas, corn, or squash.  cup or 4 oz (120 g) hot cereal.  cup or 3 oz (85 g) boiled or mashed potatoes, or  or 3 oz (85 g) of a large baked potato.  cup or 4 fl oz (118 mL) fruit juice. 1 cup or 8 fl oz (237 mL) milk. 1 small or 4 oz (106 g) apple.  or 2 oz (63 g) of a medium banana. 1 cup or 5 oz (150 g) strawberries. 3 cups or 1 oz (24 g) popped popcorn. What is an example of carbohydrate counting? To calculate the number of carbohydrates in this sample meal, follow the stepsshown below. Sample meal 3 oz (85 g) chicken breast. ? cup or 4 oz (106 g) brown rice.    cup or 3 oz (85 g) corn. 1 cup or 8 fl oz (237 mL) milk. 1 cup or 5 oz (150 g) strawberries with sugar-free whipped topping. Carbohydrate calculation Identify the foods that contain carbohydrates: Rice. Corn. Milk. Strawberries. Calculate how many servings you have of each food: 2 servings rice. 1 serving corn. 1 serving milk. 1 serving strawberries. Multiply each number of servings by 15 g: 2 servings  rice x 15 g = 30 g. 1 serving corn x 15 g = 15 g. 1 serving milk x 15 g = 15 g. 1 serving strawberries x 15 g = 15 g. Add together all of the amounts to find the total grams of carbohydrates eaten: 30 g + 15 g + 15 g + 15 g = 75 g of carbohydrates total. What are tips for following this plan? Shopping Develop a meal plan and then make a shopping list. Buy fresh and frozen vegetables, fresh and frozen fruit, dairy, eggs, beans, lentils, and whole grains. Look at food labels. Choose foods that have more fiber and less sugar. Avoid processed foods and foods with added sugars. Meal planning Aim to have the same amount of carbohydrates at each meal and for each snack time. Plan to have regular, balanced meals and snacks. Where to find more information American Diabetes Association: www.diabetes.org Centers for Disease Control and Prevention: www.cdc.gov Summary Carbohydrate counting is a method of keeping track of how many carbohydrates you eat. Eating carbohydrates naturally increases the amount of sugar (glucose) in the blood. Counting how many carbohydrates you eat improves your blood glucose control, which helps you manage your diabetes. A dietitian can help you make a meal plan and calculate how many carbohydrates you should have at each meal and snack. This information is not intended to replace advice given to you by your health care provider. Make sure you discuss any questions you have with your healthcare provider. Document Revised: 10/05/2019 Document Reviewed: 10/06/2019 Elsevier Patient Education  2021 Elsevier Inc.  

## 2021-05-06 NOTE — Assessment & Plan Note (Signed)
TSH and Free T4 today Will adjust Levothyroxine if needed based on labs 

## 2021-05-06 NOTE — Assessment & Plan Note (Signed)
Uncontrolled A1C today No urine microalbumin secondary to ARB therapy Encouraged her to consume a low carb diet and exercise for weight loss Encouraged yearly eye exam Encouraged routine foot exam She declines vaccines Continue Glipizide for now

## 2021-05-07 ENCOUNTER — Other Ambulatory Visit: Payer: Self-pay | Admitting: Internal Medicine

## 2021-05-07 LAB — CBC
HCT: 48.6 % — ABNORMAL HIGH (ref 35.0–45.0)
Hemoglobin: 16.1 g/dL — ABNORMAL HIGH (ref 11.7–15.5)
MCH: 31.3 pg (ref 27.0–33.0)
MCHC: 33.1 g/dL (ref 32.0–36.0)
MCV: 94.4 fL (ref 80.0–100.0)
MPV: 9.3 fL (ref 7.5–12.5)
Platelets: 283 10*3/uL (ref 140–400)
RBC: 5.15 10*6/uL — ABNORMAL HIGH (ref 3.80–5.10)
RDW: 12.1 % (ref 11.0–15.0)
WBC: 10.3 10*3/uL (ref 3.8–10.8)

## 2021-05-07 LAB — COMPLETE METABOLIC PANEL WITH GFR
AG Ratio: 1.5 (calc) (ref 1.0–2.5)
ALT: 24 U/L (ref 6–29)
AST: 15 U/L (ref 10–35)
Albumin: 4 g/dL (ref 3.6–5.1)
Alkaline phosphatase (APISO): 90 U/L (ref 37–153)
BUN: 13 mg/dL (ref 7–25)
CO2: 34 mmol/L — ABNORMAL HIGH (ref 20–32)
Calcium: 9.8 mg/dL (ref 8.6–10.4)
Chloride: 100 mmol/L (ref 98–110)
Creat: 0.7 mg/dL (ref 0.50–1.03)
Globulin: 2.6 g/dL (calc) (ref 1.9–3.7)
Glucose, Bld: 173 mg/dL — ABNORMAL HIGH (ref 65–99)
Potassium: 4.7 mmol/L (ref 3.5–5.3)
Sodium: 138 mmol/L (ref 135–146)
Total Bilirubin: 0.4 mg/dL (ref 0.2–1.2)
Total Protein: 6.6 g/dL (ref 6.1–8.1)
eGFR: 104 mL/min/{1.73_m2} (ref >=60)

## 2021-05-07 LAB — T4, FREE: Free T4: 1.2 ng/dL (ref 0.8–1.8)

## 2021-05-07 LAB — HEMOGLOBIN A1C
Hgb A1c MFr Bld: 6.7 % of total Hgb — ABNORMAL HIGH (ref <5.7)
Mean Plasma Glucose: 146 mg/dL
eAG (mmol/L): 8.1 mmol/L

## 2021-05-07 LAB — TSH: TSH: 7.17 mIU/L — ABNORMAL HIGH

## 2021-05-07 LAB — LIPID PANEL
Cholesterol: 184 mg/dL (ref <200)
HDL: 50 mg/dL (ref >=50)
LDL Cholesterol (Calc): 109 mg/dL (calc) — ABNORMAL HIGH
Non-HDL Cholesterol (Calc): 134 mg/dL (calc) — ABNORMAL HIGH (ref <130)
Total CHOL/HDL Ratio: 3.7 (calc) (ref <5.0)
Triglycerides: 137 mg/dL (ref <150)

## 2021-05-07 NOTE — Telephone Encounter (Signed)
  Notes to clinic:  Duplicate message Patient was seen in office yesterday  Review for refills Medication dose may have been changed   Requested Prescriptions  Pending Prescriptions Disp Refills   losartan (COZAAR) 50 MG tablet [Pharmacy Med Name: Losartan Potassium 50 MG Oral Tablet] 30 tablet 0    Sig: Take 1 tablet by mouth once daily      Cardiovascular:  Angiotensin Receptor Blockers Passed - 05/07/2021  9:34 AM      Passed - Cr in normal range and within 180 days    Creat  Date Value Ref Range Status  05/06/2021 0.70 0.50 - 1.03 mg/dL Final          Passed - K in normal range and within 180 days    Potassium  Date Value Ref Range Status  05/06/2021 4.7 3.5 - 5.3 mmol/L Final          Passed - Patient is not pregnant      Passed - Last BP in normal range    BP Readings from Last 1 Encounters:  05/06/21 133/77          Passed - Valid encounter within last 6 months    Recent Outpatient Visits           Yesterday Acquired hypothyroidism   [facility] [place], Coralie Keens, NP                  levothyroxine (SYNTHROID) 112 MCG tablet [Pharmacy Med Name: Levothyroxine Sodium 112 MCG Oral Tablet] 90 tablet 0    Sig: Take 1 tablet by mouth once daily      Endocrinology:  Hypothyroid Agents Failed - 05/07/2021  9:34 AM      Failed - TSH needs to be rechecked within 3 months after an abnormal result. Refill until TSH is due.      Failed - TSH in normal range and within 360 days    TSH  Date Value Ref Range Status  05/06/2021 7.17 (H) mIU/L Final    Comment:              Reference Range .           > or = 20 Years  0.40-4.50 .                Pregnancy Ranges           First trimester    0.26-2.66           Second trimester   0.55-2.73           Third trimester    0.43-2.91           Passed - Valid encounter within last 12 months    Recent Outpatient Visits           Yesterday Acquired hypothyroidism   [facility] [place],  Coralie Keens, [place]

## 2021-05-07 NOTE — Telephone Encounter (Signed)
Pt called about refill for losartan (COZAAR) 50 MG tablet  And  levothyroxine (SYNTHROID) 112 MCG tablet  / Please advise

## 2021-05-07 NOTE — Telephone Encounter (Signed)
   Notes to clinic:  Medication was filled by different provider  Patient had appt 05/06/2021 Medication dose was changed    Requested Prescriptions  Pending Prescriptions Disp Refills   losartan (COZAAR) 50 MG tablet [Pharmacy Med Name: Losartan Potassium 50 MG Oral Tablet] 30 tablet 0    Sig: Take 1 tablet by mouth once daily      Cardiovascular:  Angiotensin Receptor Blockers Passed - 05/07/2021  9:27 AM      Passed - Cr in normal range and within 180 days    Creat  Date Value Ref Range Status  05/06/2021 0.70 0.50 - 1.03 mg/dL Final          Passed - K in normal range and within 180 days    Potassium  Date Value Ref Range Status  05/06/2021 4.7 3.5 - 5.3 mmol/L Final          Passed - Patient is not pregnant      Passed - Last BP in normal range    BP Readings from Last 1 Encounters:  05/06/21 133/77          Passed - Valid encounter within last 6 months    Recent Outpatient Visits           Yesterday Acquired hypothyroidism   [facility] [place], Coralie Keens, NP                  levothyroxine (SYNTHROID) 112 MCG tablet [Pharmacy Med Name: Levothyroxine Sodium 112 MCG Oral Tablet] 90 tablet 0    Sig: Take 1 tablet by mouth once daily      Endocrinology:  Hypothyroid Agents Failed - 05/07/2021  9:27 AM      Failed - TSH needs to be rechecked within 3 months after an abnormal result. Refill until TSH is due.      Failed - TSH in normal range and within 360 days    TSH  Date Value Ref Range Status  05/06/2021 7.17 (H) mIU/L Final    Comment:              Reference Range .           > or = 20 Years  0.40-4.50 .                Pregnancy Ranges           First trimester    0.26-2.66           Second trimester   0.55-2.73           Third trimester    0.43-2.91           Passed - Valid encounter within last 12 months    Recent Outpatient Visits           Yesterday Acquired hypothyroidism   [facility] [place], Coralie Keens,  [place]

## 2021-05-12 ENCOUNTER — Other Ambulatory Visit: Payer: Self-pay | Admitting: Internal Medicine

## 2021-05-26 ENCOUNTER — Telehealth: Payer: Self-pay

## 2021-05-26 NOTE — Telephone Encounter (Signed)
LAST OV -06/18/2020 NEXT OV - N/A LAST FILLED - FUROSEMIDE (LASIX) - 02/18/2021

## 2021-05-28 ENCOUNTER — Other Ambulatory Visit: Payer: Self-pay

## 2021-05-28 DIAGNOSIS — I10 Essential (primary) hypertension: Secondary | ICD-10-CM

## 2021-05-28 MED ORDER — FUROSEMIDE 20 MG PO TABS: 20.0000 mg | ORAL_TABLET | Freq: Every day | ORAL | 0 refills | Status: DC

## 2021-06-03 NOTE — Telephone Encounter (Signed)
Pt stated meds have been refilled by [facility]

## 2021-06-20 ENCOUNTER — Other Ambulatory Visit: Payer: Self-pay | Admitting: Internal Medicine

## 2021-06-20 NOTE — Telephone Encounter (Signed)
Valid encounter. No future visit at this time

## 2021-08-05 ENCOUNTER — Telehealth: Payer: Self-pay

## 2021-08-05 NOTE — Telephone Encounter (Signed)
Copied from [place] [phone]. Topic: General - Other >> Aug 04, 2021  1:56 PM Bayard Beaver wrote: Reason for [id number] from [place] pharm[facility] called states they dont have the alvagen brand of levothyroxine (SYNTHROID) 125 MCG tablet  anymore they have the accord brand and she wants to know if this is ok.

## 2021-08-06 NOTE — Telephone Encounter (Signed)
Ok to substitute?

## 2021-08-10 ENCOUNTER — Other Ambulatory Visit: Payer: Self-pay | Admitting: Internal Medicine

## 2021-08-10 NOTE — Telephone Encounter (Signed)
Requested Prescriptions  Pending Prescriptions Disp Refills  . sertraline (ZOLOFT) 25 MG tablet [Pharmacy Med Name: Sertraline HCl 25 MG Oral Tablet] 90 tablet 0    Sig: Take 1 tablet by mouth once daily     Psychiatry:  Antidepressants - SSRI Failed - 08/10/2021  6:30 AM      Failed - Completed PHQ-2 or PHQ-9 in the last 360 days      Passed - Valid encounter within last 6 months    Recent Outpatient Visits          3 months ago Acquired hypothyroidism   [facility] [place], Coralie Keens, NP

## 2021-08-11 ENCOUNTER — Other Ambulatory Visit: Payer: Self-pay | Admitting: Internal Medicine

## 2021-08-12 NOTE — Telephone Encounter (Signed)
Requested Prescriptions  Pending Prescriptions Disp Refills  . hydrOXYzine (VISTARIL) 25 MG capsule [Pharmacy Med Name: hydrOXYzine Pamoate 25 MG Oral Capsule] 90 capsule 0    Sig: TAKE 1 CAPSULE BY MOUTH EVERY 8 HOURS AS NEEDED     Ear, Nose, and Throat:  Antihistamines Passed - 08/11/2021  8:44 PM      Passed - Valid encounter within last 12 months    Recent Outpatient Visits          3 months ago Acquired hypothyroidism   [facility] [place], Coralie Keens, NP

## 2021-08-31 ENCOUNTER — Other Ambulatory Visit: Payer: Self-pay | Admitting: Internal Medicine

## 2021-08-31 DIAGNOSIS — I10 Essential (primary) hypertension: Secondary | ICD-10-CM

## 2021-09-01 NOTE — Telephone Encounter (Signed)
Requested medication (s) are due for refill today:   Yes  Requested medication (s) are on the active medication list:   Yes  Future visit scheduled:   No  voicemail left for pt to call in and make an appt   Last ordered: 05/28/2021 #90, 0 refills  Per note needs appt for further refills.   I called and left a voicemail for her to call and make an appt.   Requested Prescriptions  Pending Prescriptions Disp Refills   furosemide (LASIX) 20 MG tablet [Pharmacy Med Name: Furosemide 20 MG Oral Tablet] 90 tablet 0    Sig: TAKE 1 TABLET BY MOUTH ONCE DAILY . APPOINTMENT REQUIRED FOR FUTURE REFILLS     Cardiovascular:  Diuretics - Loop Passed - 08/31/2021  1:00 PM      Passed - K in normal range and within 360 days    Potassium  Date Value Ref Range Status  05/06/2021 4.7 3.5 - 5.3 mmol/L Final          Passed - Ca in normal range and within 360 days    Calcium  Date Value Ref Range Status  05/06/2021 9.8 8.6 - 10.4 mg/dL Final          Passed - Na in normal range and within 360 days    Sodium  Date Value Ref Range Status  05/06/2021 138 135 - 146 mmol/L Final          Passed - Cr in normal range and within 360 days    Creat  Date Value Ref Range Status  05/06/2021 0.70 0.50 - 1.03 mg/dL Final          Passed - Last BP in normal range    BP Readings from Last 1 Encounters:  05/06/21 133/77          Passed - Valid encounter within last 6 months    Recent Outpatient Visits           3 months ago Acquired hypothyroidism   [facility] [place], Coralie Keens, NP

## 2021-09-05 ENCOUNTER — Other Ambulatory Visit: Payer: Self-pay | Admitting: Internal Medicine

## 2021-09-05 DIAGNOSIS — I10 Essential (primary) hypertension: Secondary | ICD-10-CM

## 2021-09-06 NOTE — Telephone Encounter (Signed)
Requested medication (s) are due for refill today: yes  Requested medication (s) are on the active medication list: yes  Last refill:  05/28/21 #90  Future visit scheduled: no  Notes to clinic:   called pt and LM on VM to call office to make appt- call back number provided   Requested Prescriptions  Pending Prescriptions Disp Refills   furosemide (LASIX) 20 MG tablet [Pharmacy Med Name: Furosemide 20 MG Oral Tablet] 90 tablet 0    Sig: TAKE 1 TABLET BY MOUTH ONCE DAILY . APPOINTMENT REQUIRED FOR FUTURE REFILLS     Cardiovascular:  Diuretics - Loop Passed - 09/05/2021  5:53 PM      Passed - K in normal range and within 360 days    Potassium  Date Value Ref Range Status  05/06/2021 4.7 3.5 - 5.3 mmol/L Final          Passed - Ca in normal range and within 360 days    Calcium  Date Value Ref Range Status  05/06/2021 9.8 8.6 - 10.4 mg/dL Final          Passed - Na in normal range and within 360 days    Sodium  Date Value Ref Range Status  05/06/2021 138 135 - 146 mmol/L Final          Passed - Cr in normal range and within 360 days    Creat  Date Value Ref Range Status  05/06/2021 0.70 0.50 - 1.03 mg/dL Final          Passed - Last BP in normal range    BP Readings from Last 1 Encounters:  05/06/21 133/77          Passed - Valid encounter within last 6 months    Recent Outpatient Visits           4 months ago Acquired hypothyroidism   [facility] [place], Coralie Keens, NP

## 2021-10-06 ENCOUNTER — Ambulatory Visit (INDEPENDENT_AMBULATORY_CARE_PROVIDER_SITE_OTHER): Payer: 59 | Admitting: Internal Medicine

## 2021-10-06 ENCOUNTER — Ambulatory Visit: Payer: Self-pay | Admitting: *Deleted

## 2021-10-06 ENCOUNTER — Other Ambulatory Visit: Payer: Self-pay

## 2021-10-06 ENCOUNTER — Encounter: Payer: Self-pay | Admitting: Internal Medicine

## 2021-10-06 DIAGNOSIS — H109 Unspecified conjunctivitis: Secondary | ICD-10-CM | POA: Diagnosis not present

## 2021-10-06 MED ORDER — HYDROXYZINE PAMOATE 25 MG PO CAPS: ORAL_CAPSULE | ORAL | 0 refills | Status: DC

## 2021-10-06 MED ORDER — POLYMYXIN B-TRIMETHOPRIM 10000-0.1 UNIT/ML-% OP SOLN: 1.0000 [drp] | OPHTHALMIC | 0 refills | Status: DC

## 2021-10-06 NOTE — Progress Notes (Signed)
Virtual Visit via Video Note  I connected with Donna Whitaker on 10/06/21 at  3:20 PM EST by a video enabled telemedicine application and verified that I am speaking with the correct person using two identifiers.  Location: Patient: Home Provider: Office  Person's participating in this video call: Webb Silversmith, NP and Maryjean Morn   I discussed the limitations of evaluation and management by telemedicine and the availability of in person appointments. The patient expressed understanding and agreed to proceed.  History of Present Illness:   Patient presents the clinic today with complaint of right eye redness, irritation and drainage.  She reports this started 1 week ago. She denies headache, runny nose, nasal congestion, ear pain, or sore throat. She denies fever, chills or body aches. She has tried Visine, allergy eye drops OTC with minimal relief of symptoms.   Past Medical History:  Diagnosis Date   Allergy    Anxiety    Arthritis    knees, back   COPD (chronic obstructive pulmonary disease) (HCC)    uses albuterol as needed   Depression    Diabetes mellitus without complication (HCC)    Heavy smoker    2ppd   History of chicken pox    Hypertension    Hypothyroidism    Intraductal papilloma of breast, left     Current Outpatient Medications  Medication Sig Dispense Refill   albuterol (PROVENTIL HFA;VENTOLIN HFA) 108 (90 Base) MCG/ACT inhaler Inhale 2 puffs into the lungs every 6 (six) hours as needed for wheezing or shortness of breath. 1 Inhaler 0   Cholecalciferol (EQL VITAMIN D3) 50 MCG (2000 UT) CAPS Take by mouth.     fexofenadine (ALLEGRA) 180 MG tablet Take 180 mg by mouth daily.     furosemide (LASIX) 20 MG tablet TAKE 1 TABLET BY MOUTH ONCE DAILY . APPOINTMENT REQUIRED FOR FUTURE REFILLS 90 tablet 0   glipiZIDE (GLUCOTROL) 5 MG tablet TAKE 1 TABLET (5 MG TOTAL) BY MOUTH 2 (TWO) TIMES DAILY BEFORE A MEAL. (Patient taking differently: Take 5 mg by mouth daily  before breakfast.) 180 tablet 0   hydrOXYzine (ATARAX) 25 MG tablet Take 25 mg by mouth 2 (two) times daily as needed.     hydrOXYzine (VISTARIL) 25 MG capsule TAKE 1 CAPSULE BY MOUTH EVERY 8 HOURS AS NEEDED 90 capsule 0   ibuprofen (ADVIL,MOTRIN) 200 MG tablet Take 800 mg by mouth daily as needed.     Lancets (ONETOUCH ULTRASOFT) lancets 1 each by Other route in the morning and at bedtime. 200 each 1   levothyroxine (SYNTHROID) 125 MCG tablet Take 1 tablet (125 mcg total) by mouth daily. 90 tablet 3   losartan (COZAAR) 50 MG tablet Take 1 tablet by mouth once daily 90 tablet 2   ONETOUCH ULTRA test strip 1 EACH BY OTHER ROUTE IN THE MORNING AND AT BEDTIME. 200 strip 1   sertraline (ZOLOFT) 25 MG tablet Take 1 tablet by mouth once daily 90 tablet 0   No current facility-administered medications for this visit.    Allergies  Allergen Reactions   Betadine [Povidone Iodine] Other (See Comments)    Skin irritation   Vicodin [Hydrocodone-Acetaminophen] Palpitations    Chest pain    Family History  Problem Relation Age of Onset   Arthritis Mother    Hyperlipidemia Mother    Alcohol abuse Father    Hyperlipidemia Father    Heart disease Father    Hypertension Father    Arthritis Maternal Grandmother  Hyperlipidemia Maternal Grandmother    Heart disease Maternal Grandmother    Hypertension Maternal Grandmother    Diabetes Maternal Grandmother    Hyperlipidemia Maternal Grandfather    Hypertension Maternal Grandfather    Hyperlipidemia Paternal Grandmother    Hypertension Paternal Grandmother    Stomach cancer Paternal Grandmother    Alcohol abuse Paternal Grandfather    Hyperlipidemia Paternal Grandfather    Heart disease Paternal Grandfather    Hypertension Paternal Grandfather     Social History   Socioeconomic History   Marital status: Married    Spouse name: Not on file   Number of children: Not on file   Years of education: Not on file   Highest education level: Not  on file  Occupational History   Not on file  Tobacco Use   Smoking status: Every Day    Packs/day: 2.00    Years: 35.00    Pack years: 70.00    Types: Cigarettes   Smokeless tobacco: Never  Substance and Sexual Activity   Alcohol use: Yes    Comment: occasional   Drug use: No   Sexual activity: Yes    Birth control/protection: Post-menopausal, Surgical    Comment: BTL  Other Topics Concern   Not on file  Social History Narrative   Not on file   Social Determinants of Health   Financial Resource Strain: Not on file  Food Insecurity: Not on file  Transportation Needs: Not on file  Physical Activity: Not on file  Stress: Not on file  Social Connections: Not on file  Intimate Partner Violence: Not on file     Constitutional: Denies fever, malaise, fatigue, headache or abrupt weight changes.  HEENT: Pt reports right eye irritation and drainage. Denies ear pain, ringing in the ears, wax buildup, runny nose, nasal congestion, bloody nose, or sore throat. Respiratory: Denies difficulty breathing, shortness of breath, cough or sputum production.   Cardiovascular: Denies chest pain, chest tightness, palpitations or swelling in the hands or feet.   No other specific complaints in a complete review of systems (except as listed in HPI above).    Observations/Objective:  Wt Readings from Last 3 Encounters:  05/06/21 (!) 310 lb 12.8 oz (141 kg)  08/08/20 296 lb 1.2 oz (134.3 kg)  06/18/20 (!) 309 lb (140.2 kg)    General: Appears her stated age, in NAD. HEENT:  Eyes: right sclera red, eyelid puffy- unable to see drainage through video;  Nose: no congestion noted; Throat/Mouth: slight hoarseness noted. Pulmonary/Chest: Normal effort. No respiratory distress. Neurological: Alert and oriented.   BMET    Component Value Date/Time   NA 138 05/06/2021 0910   K 4.7 05/06/2021 0910   CL 100 05/06/2021 0910   CO2 34 (H) 05/06/2021 0910   GLUCOSE 173 (H) 05/06/2021 0910   BUN 13  05/06/2021 0910   CREATININE 0.70 05/06/2021 0910   CALCIUM 9.8 05/06/2021 0910   GFRNONAA >60 08/05/2020 0820    Lipid Panel     Component Value Date/Time   CHOL 184 05/06/2021 0910   TRIG 137 05/06/2021 0910   HDL 50 05/06/2021 0910   CHOLHDL 3.7 05/06/2021 0910   VLDL 20.6 04/30/2020 0921   LDLCALC 109 (H) 05/06/2021 0910    CBC    Component Value Date/Time   WBC 10.3 05/06/2021 0910   RBC 5.15 (H) 05/06/2021 0910   HGB 16.1 (H) 05/06/2021 0910   HCT 48.6 (H) 05/06/2021 0910   PLT 283 05/06/2021 0910   MCV  94.4 05/06/2021 0910   MCH 31.3 05/06/2021 0910   MCHC 33.1 05/06/2021 0910   RDW 12.1 05/06/2021 0910    Hgb A1C Lab Results  Component Value Date   HGBA1C 6.7 (H) 05/06/2021       Assessment and Plan:  Bacterial Conjunctivitis, Right Eye:  Encouraged warm compresses 3 times a day Rx for Polytrim eyedrops every 4 hours while awake for 7 days Hydroxyzine refilled per patient request  Return precautions discussed  Follow Up Instructions:    I discussed the assessment and treatment plan with the patient. The patient was provided an opportunity to ask questions and all were answered. The patient agreed with the plan and demonstrated an understanding of the instructions.   The patient was advised to call back or seek an in-person evaluation if the symptoms worsen or if the condition fails to improve as anticipated.    Webb Silversmith, NP

## 2021-10-06 NOTE — Patient Instructions (Signed)
Bacterial Conjunctivitis, Adult Bacterial conjunctivitis is an infection of your conjunctiva. This is the clear membrane that covers the white part of your eye and the inner part of your eyelid. This infection can make your eye: Red or pink. Itchy or irritated. This condition spreads easily from person to person (is contagious) and from one eye to the other eye. What are the causes? This condition is caused by germs (bacteria). You may get the infection if you come into close contact with: A person who has the infection. Items that have germs on them (are contaminated), such as face towels, contact lens solution, or eye makeup. What increases the risk? You are more likely to get this condition if: You have contact with people who have the infection. You wear contact lenses. You have a sinus infection. You have had a recent eye injury or surgery. You have a weak body defense system (immune system). You have dry eyes. What are the signs or symptoms?  Thick, yellowish discharge from the eye. Tearing or watery eyes. Itchy eyes. Burning feeling in your eyes. Eye redness. Swollen eyelids. Blurred vision. How is this treated?  Antibiotic eye drops or ointment. Antibiotic medicine taken by mouth. This is used for infections that do not get better with drops or ointment or that last more than 10 days. Cool, wet cloths placed on the eyes. Artificial tears used 2-6 times a day. Follow these instructions at home: Medicines Take or apply your antibiotic medicine as told by your doctor. Do not stop using it even if you start to feel better. Take or apply over-the-counter and prescription medicines only as told by your doctor. Do not touch your eyelid with the eye-drop bottle or the ointment tube. Managing discomfort Wipe any fluid from your eye with a warm, wet washcloth or a cotton ball. Place a clean, cool, wet cloth on your eye. Do this for 10-20 minutes, 3-4 times a day. General  instructions Do not wear contacts until the infection is gone. Wear glasses until your doctor says it is okay to wear contacts again. Do not wear eye makeup until the infection is gone. Throw away old eye makeup. Change or wash your pillowcase every day. Do not share towels or washcloths. Wash your hands often with soap and water for at least 20 seconds and especially before touching your face or eyes. Use paper towels to dry your hands. Do not touch or rub your eyes. Do not drive or use heavy machinery if your vision is blurred. Contact a doctor if: You have a fever. You do not get better after 10 days. Get help right away if: You have a fever and your symptoms get worse all of a sudden. You have very bad pain when you move your eye. Your face: Hurts. Is red. Is swollen. You have sudden loss of vision. Summary Bacterial conjunctivitis is an infection of your conjunctiva. This infection spreads easily from person to person. Wash your hands often with soap and water for at least 20 seconds and especially before touching your face or eyes. Use paper towels to dry your hands. Take or apply your antibiotic medicine as told by your doctor. Contact a doctor if you have a fever or you do not get better after 10 days. This information is not intended to replace advice given to you by your health care provider. Make sure you discuss any questions you have with your health care provider. Document Revised: 01/15/2021 Document Reviewed: 01/15/2021 Elsevier Patient Education    2022 Elsevier Inc.  

## 2021-10-06 NOTE — Telephone Encounter (Signed)
Patient called, left VM to return the call to the office to discuss symptoms with a nurse. Unable to reach patient after 3 attempts by [facility] NT, routing to the provider for resolution per protocol.   Summary: blurry vision / eye iritation   The patient has experienced right eye irritation for more than a week   The patient describes their symptoms as eye irritation and frequent watering   The patient shares that their vision is blurry as well   Please contact further when possible

## 2021-10-06 NOTE — Telephone Encounter (Signed)
The patient has experienced right eye irritation for more than a week   The patient describes their symptoms as eye irritation and frequent watering   The patient shares that their vision is blurry as well   Please contact further when possible   2nd attempt to contact patient . No answer, left message to call clinic back [phone].

## 2021-10-06 NOTE — Telephone Encounter (Signed)
°  Chief Complaint: right eye drainage , allergy drops not helping, blurred vision Symptoms: constant watering, yellow drainage, blurred vision, red sclera .  Frequency: since Friday  Pertinent Negatives: Patient denies pain, fever, no swelling of eyelids  Disposition: [] ED /[] Urgent Care (no appt availability in office) / [x] Appointment(In office/virtual)/ []  [facility]/ [] Home Care/ [] Refused Recommended Disposition  Additional Notes:  Appt scheduled for today instead of 10/08/21 please advise patient to cancel appt 10/08/21.          Reason for Disposition  Blurred vision  Answer Assessment - Initial Assessment Questions 1. EYE DISCHARGE: "Is the discharge in one or both eyes?" "What color is it?" "How much is there?" "When did the discharge start?"      Right eye, color yellow , started Friday  2. REDNESS OF SCLERA: "Is the redness in one or both eyes?" "When did the redness start?"      Red in right more than left  3. EYELIDS: "Are the eyelids red or swollen?" If Yes, ask: "How much?"      No  4. VISION: "Is there any difficulty seeing clearly?"      Right eye blurry vision  5. PAIN: "Is there any pain? If Yes, ask: "How bad is it?" (Scale 1-10; or mild, moderate, severe)    - MILD (1-3): doesn't interfere with normal activities     - MODERATE (4-7): interferes with normal activities or awakens from sleep    - SEVERE (8-10): excruciating pain, unable to do any normal activities       No pain but itching  6. CONTACT LENS: "Do you wear contacts?"     na 7. OTHER SYMPTOMS: "Do you have any other symptoms?" (e.g., fever, runny nose, cough)     No  8. PREGNANCY: "Is there any chance you are pregnant?" "When was your last menstrual period?"     na  Protocols used: Eye - Pus or [place]

## 2021-10-06 NOTE — Telephone Encounter (Signed)
The patient has experienced right eye irritation for more than a week   The patient describes their symptoms as eye irritation and frequent watering   The patient shares that their vision is blurry as well   Please contact further when possible    Attempted to reach pt, left VM to call back to discuss.

## 2021-10-08 ENCOUNTER — Ambulatory Visit: Payer: 59 | Admitting: Internal Medicine

## 2021-10-24 ENCOUNTER — Telehealth: Payer: Self-pay | Admitting: Physician Assistant

## 2021-10-24 ENCOUNTER — Ambulatory Visit: Payer: Self-pay

## 2021-10-24 DIAGNOSIS — B9689 Other specified bacterial agents as the cause of diseases classified elsewhere: Secondary | ICD-10-CM

## 2021-10-24 DIAGNOSIS — J069 Acute upper respiratory infection, unspecified: Secondary | ICD-10-CM

## 2021-10-24 MED ORDER — DOXYCYCLINE HYCLATE 100 MG PO TABS: 100.0000 mg | ORAL_TABLET | Freq: Two times a day (BID) | ORAL | 0 refills | Status: DC

## 2021-10-24 MED ORDER — ALBUTEROL SULFATE HFA 108 (90 BASE) MCG/ACT IN AERS: 1.0000 | INHALATION_SPRAY | Freq: Four times a day (QID) | RESPIRATORY_TRACT | 0 refills | Status: DC | PRN

## 2021-10-24 MED ORDER — LIDOCAINE VISCOUS HCL 2 % MT SOLN: OROMUCOSAL | 0 refills | Status: DC

## 2021-10-24 MED ORDER — BENZONATATE 100 MG PO CAPS: 100.0000 mg | ORAL_CAPSULE | Freq: Three times a day (TID) | ORAL | 0 refills | Status: DC | PRN

## 2021-10-24 NOTE — Patient Instructions (Signed)
Donna Whitaker, thank you for joining Mar Daring, PA-C for today's virtual visit.  While this provider is not your primary care provider (PCP), if your PCP is located in our provider database this encounter information will be shared with them immediately following your visit.  Consent: (Patient) Donna Whitaker provided verbal consent for this virtual visit at the beginning of the encounter.  Current Medications:  Current Outpatient Medications:    albuterol (VENTOLIN HFA) 108 (90 Base) MCG/ACT inhaler, Inhale 1-2 puffs into the lungs every 6 (six) hours as needed for wheezing or shortness of breath., Disp: 18 g, Rfl: 0   benzonatate (TESSALON) 100 MG capsule, Take 1 capsule (100 mg total) by mouth 3 (three) times daily as needed., Disp: 30 capsule, Rfl: 0   doxycycline (VIBRA-TABS) 100 MG tablet, Take 1 tablet (100 mg total) by mouth 2 (two) times daily., Disp: 20 tablet, Rfl: 0   lidocaine (XYLOCAINE) 2 % solution, 49mL swish and swallow every 4 hours as needed for sore throat, Disp: 100 mL, Rfl: 0   Medications ordered in this encounter:  Meds ordered this encounter  Medications   doxycycline (VIBRA-TABS) 100 MG tablet    Sig: Take 1 tablet (100 mg total) by mouth 2 (two) times daily.    Dispense:  20 tablet    Refill:  0    Order Specific Question:   Supervising Provider    Answer:   Sabra Heck, BRIAN [[id number]]   albuterol (VENTOLIN HFA) 108 (90 Base) MCG/ACT inhaler    Sig: Inhale 1-2 puffs into the lungs every 6 (six) hours as needed for wheezing or shortness of breath.    Dispense:  18 g    Refill:  0    Order Specific Question:   Supervising Provider    Answer:   MILLER, BRIAN [[id number]]   benzonatate (TESSALON) 100 MG capsule    Sig: Take 1 capsule (100 mg total) by mouth 3 (three) times daily as needed.    Dispense:  30 capsule    Refill:  0    Order Specific Question:   Supervising Provider    Answer:   MILLER, BRIAN [[id number]]   lidocaine (XYLOCAINE) 2 % solution     Sig: 44mL swish and swallow every 4 hours as needed for sore throat    Dispense:  100 mL    Refill:  0    Order Specific Question:   Supervising Provider    Answer:   Noemi Chapel [[id number]]     *If you need refills on other medications prior to your next appointment, please contact your pharmacy*  Follow-Up: Call back or seek an in-person evaluation if the symptoms worsen or if the condition fails to improve as anticipated.  Other Instructions Upper Respiratory Infection, Adult An upper respiratory infection (URI) affects the nose, throat, and upper airways that lead to the lungs. The most common type of URI is often called the common cold. URIs usually get better on their own, without medical treatment. What are the causes? A URI is caused by a germ (virus). You may catch these germs by: Breathing in droplets from an infected person's cough or sneeze. Touching something that has the germ on it (is contaminated) and then touching your mouth, nose, or eyes. What increases the risk? You are more likely to get a URI if: You are very young or very old. You have close contact with others, such as at work, school, or a health care facility. You smoke. You  have long-term (chronic) heart or lung disease. You have a weakened disease-fighting system (immune system). You have nasal allergies or asthma. You have a lot of stress. You have poor nutrition. What are the signs or symptoms? Runny or stuffy (congested) nose. Cough. Sneezing. Sore throat. Headache. Feeling tired (fatigue). Fever. Not wanting to eat as much as usual. Pain in your forehead, behind your eyes, and over your cheekbones (sinus pain). Muscle aches. Redness or irritation of the eyes. Pressure in the ears or face. How is this treated? URIs usually get better on their own within 7-10 days. Medicines cannot cure URIs, but your doctor may recommend certain medicines to help relieve symptoms, such as: Over-the-counter cold  medicines. Medicines to reduce coughing (cough suppressants). Coughing is a type of defense against infection that helps to clear the nose, throat, windpipe, and lungs (respiratory system). Take these medicines only as told by your doctor. Medicines to lower your fever. Follow these instructions at home: Activity Rest as needed. If you have a fever, stay home from work or school until your fever is gone, or until your doctor says you may return to work or school. You should stay home until you cannot spread the infection anymore (you are not contagious). Your doctor may have you wear a face mask so you have less risk of spreading the infection. Relieving symptoms Rinse your mouth often with salt water. To make salt water, dissolve -1 tsp (3-6 g) of salt in 1 cup (237 mL) of warm water. Use a cool-mist humidifier to add moisture to the air. This can help you breathe more easily. Eating and drinking  Drink enough fluid to keep your pee (urine) pale yellow. Eat soups and other clear broths. General instructions  Take over-the-counter and prescription medicines only as told by your doctor. Do not smoke or use any products that contain nicotine or tobacco. If you need help quitting, ask your doctor. Avoid being where people are smoking (avoid secondhand smoke). Stay up to date on all your shots (immunizations), and get the flu shot every year. Keep all follow-up visits. How to prevent the spread of infection to others  Wash your hands with soap and water for at least 20 seconds. If you cannot use soap and water, use hand sanitizer. Avoid touching your mouth, face, eyes, or nose. Cough or sneeze into a tissue or your sleeve or elbow. Do not cough or sneeze into your hand or into the air. Contact a doctor if: You are getting worse, not better. You have any of these: A fever or chills. Brown or red mucus in your nose. Yellow or brown fluid (discharge)coming from your nose. Pain in your  face, especially when you bend forward. Swollen neck glands. Pain when you swallow. White areas in the back of your throat. Get help right away if: You have shortness of breath that gets worse. You have very bad or constant: Headache. Ear pain. Pain in your forehead, behind your eyes, and over your cheekbones (sinus pain). Chest pain. You have long-lasting (chronic) lung disease along with any of these: Making high-pitched whistling sounds when you breathe, most often when you breathe out (wheezing). Long-lasting cough (more than 14 days). Coughing up blood. A change in your usual mucus. You have a stiff neck. You have changes in your: Vision. Hearing. Thinking. Mood. These symptoms may be an emergency. Get help right away. Call 911. Do not wait to see if the symptoms will go away. Do not drive yourself to  the hospital. Summary An upper respiratory infection (URI) is caused by a germ (virus). The most common type of URI is often called the common cold. URIs usually get better within 7-10 days. Take over-the-counter and prescription medicines only as told by your doctor. This information is not intended to replace advice given to you by your health care provider. Make sure you discuss any questions you have with your health care provider. Document Revised: 05/07/2021 Document Reviewed: 05/07/2021 Elsevier Patient Education  2022 [employer].    If you have been instructed to have an in-person evaluation today at a local Urgent Care facility, please use the link below. It will take you to a list of all of our available [facility], including address, phone number and hours of operation. Please do not delay care.  [facility]  If you or a family member do not have a primary care provider, use the link below to schedule a visit and establish care. When you choose a [facility] primary care physician or advanced practice provider, you gain a long-term  partner in health. Find a Primary Care Provider  Learn more about [facility] [place]'s in-office and virtual care options: [facility]

## 2021-10-24 NOTE — Progress Notes (Signed)
Virtual Visit Consent   Donna Whitaker, you are scheduled for a virtual visit with a [facility] provider today.     Just as with appointments in the office, your consent must be obtained to participate.  Your consent will be active for this visit and any virtual visit you may have with one of our providers in the next 365 days.     If you have a MyChart account, a copy of this consent can be sent to you electronically.  All virtual visits are billed to your insurance company just like a traditional visit in the office.    As this is a virtual visit, video technology does not allow for your provider to perform a traditional examination.  This may limit your provider's ability to fully assess your condition.  If your provider identifies any concerns that need to be evaluated in person or the need to arrange testing (such as labs, EKG, etc.), we will make arrangements to do so.     Although advances in technology are sophisticated, we cannot ensure that it will always work on either your end or our end.  If the connection with a video visit is poor, the visit may have to be switched to a telephone visit.  With either a video or telephone visit, we are not always able to ensure that we have a secure connection.     I need to obtain your verbal consent now.   Are you willing to proceed with your visit today?    Donna Whitaker has provided verbal consent on 10/24/2021 for a virtual visit (video or telephone).   Mar Daring, PA-C   Date: 10/24/2021 12:23 PM   Virtual Visit via Video Note   I, Mar Daring, connected with  Donna Whitaker  ([id number], 1June 04, 1972) on 10/24/21 at 12:15 PM EST by a video-enabled telemedicine application and verified that I am speaking with the correct person using two identifiers.  Location: Patient: Virtual Visit Location Patient: Home Provider: Virtual Visit Location Provider: Home Office   I discussed the limitations of evaluation and  management by telemedicine and the availability of in person appointments. The patient expressed understanding and agreed to proceed.    History of Present Illness: Donna Whitaker is a 51 y.o. who identifies as a female who was assigned female at birth, and is being seen today for URI symptoms.  HPI: URI  This is a new problem. The current episode started 1 to 4 weeks ago (8 days ago). The problem has been gradually worsening. Associated symptoms include congestion, coughing, ear pain (right), headaches (initially but improving), a plugged ear sensation, rhinorrhea, a sore throat (right side worse), swollen glands and wheezing. Pertinent negatives include no diarrhea, nausea, sinus pain or vomiting. Associated symptoms comments: Chills, hoarse voice, post nasal drainage. She has tried inhaler use (nyquil, mucinex, throat lozenges, ibuprofen, albuterol, tessalon perles) for the symptoms. The treatment provided mild relief.     Problems: There are no problems to display for this patient.   Allergies:  Allergies  Allergen Reactions   Hydrocodone-Acetaminophen    Iodine    Medications:  Current Outpatient Medications:    albuterol (VENTOLIN HFA) 108 (90 Base) MCG/ACT inhaler, Inhale 1-2 puffs into the lungs every 6 (six) hours as needed for wheezing or shortness of breath., Disp: 18 g, Rfl: 0   benzonatate (TESSALON) 100 MG capsule, Take 1 capsule (100 mg total) by mouth 3 (three) times daily as needed., Disp:  30 capsule, Rfl: 0   doxycycline (VIBRA-TABS) 100 MG tablet, Take 1 tablet (100 mg total) by mouth 2 (two) times daily., Disp: 20 tablet, Rfl: 0   lidocaine (XYLOCAINE) 2 % solution, 66mL swish and swallow every 4 hours as needed for sore throat, Disp: 100 mL, Rfl: 0  Observations/Objective: Patient is well-developed, well-nourished in no acute distress.  Resting comfortably at home.  Head is normocephalic, atraumatic.  No labored breathing.  Speech is clear and coherent with  logical content.  Patient is alert and oriented at baseline.  Hoarse voice  Assessment and Plan: 1. Bacterial upper respiratory infection - doxycycline (VIBRA-TABS) 100 MG tablet; Take 1 tablet (100 mg total) by mouth 2 (two) times daily.  Dispense: 20 tablet; Refill: 0 - albuterol (VENTOLIN HFA) 108 (90 Base) MCG/ACT inhaler; Inhale 1-2 puffs into the lungs every 6 (six) hours as needed for wheezing or shortness of breath.  Dispense: 18 g; Refill: 0 - benzonatate (TESSALON) 100 MG capsule; Take 1 capsule (100 mg total) by mouth 3 (three) times daily as needed.  Dispense: 30 capsule; Refill: 0 - lidocaine (XYLOCAINE) 2 % solution; 57mL swish and swallow every 4 hours as needed for sore throat  Dispense: 100 mL; Refill: 0  - Suspect bacterial URI, worsening over 8 days - Will add Doxycycline, Albuterol, Tessalon perles, and Viscous Lidocaine - Push fluids - Steam treatments or humidifier - Rest - Seek in person evaluation if symptoms worsen or fail to improve  Follow Up Instructions: I discussed the assessment and treatment plan with the patient. The patient was provided an opportunity to ask questions and all were answered. The patient agreed with the plan and demonstrated an understanding of the instructions.  A copy of instructions were sent to the patient via MyChart unless otherwise noted below.   The patient was advised to call back or seek an in-person evaluation if the symptoms worsen or if the condition fails to improve as anticipated.  Time:  I spent 12 minutes with the patient via telehealth technology discussing the above problems/concerns.    Mar Daring, PA-C

## 2021-10-24 NOTE — Telephone Encounter (Signed)
°  Chief Complaint: Productive cough with yellow mucus Symptoms: Cough, chills, shortness of breath Frequency: Started 8 days ago Pertinent Negatives: Patient denies fever Disposition: [] ED /[] Urgent Care (no appt availability in office) / [] Appointment(In office/virtual)/ [x]  [facility]/ [] Home Care/ [] Refused Recommended Disposition /[] [facility]/ []  Follow-up with PCP Additional Notes:    Reason for Disposition  [1] MILD difficulty breathing (e.g., minimal/no SOB at rest, SOB with walking, pulse <100) AND [2] still present when not coughing  Answer Assessment - Initial Assessment Questions 1. ONSET: "When did the cough begin?"      8 days ago 2. SEVERITY: "How bad is the cough today?"      Moderate 3. SPUTUM: "Describe the color of your sputum" (none, dry cough; clear, white, yellow, green)     Yellow 4. HEMOPTYSIS: "Are you coughing up any blood?" If so ask: "How much?" (flecks, streaks, tablespoons, etc.)     No 5. DIFFICULTY BREATHING: "Are you having difficulty breathing?" If Yes, ask: "How bad is it?" (e.g., mild, moderate, severe)    - MILD: No SOB at rest, mild SOB with walking, speaks normally in sentences, can lie down, no retractions, pulse < 100.    - MODERATE: SOB at rest, SOB with minimal exertion and prefers to sit, cannot lie down flat, speaks in phrases, mild retractions, audible wheezing, pulse 100-120.    - SEVERE: Very SOB at rest, speaks in single words, struggling to breathe, sitting hunched forward, retractions, pulse > 120      Mild 6. FEVER: "Do you have a fever?" If Yes, ask: "What is your temperature, how was it measured, and when did it start?"     Chills 7. CARDIAC HISTORY: "Do you have any history of heart disease?" (e.g., heart attack, congestive heart failure)      No 8. LUNG HISTORY: "Do you have any history of lung disease?"  (e.g., pulmonary embolus, asthma, emphysema)     COPD 9. PE RISK FACTORS: "Do you have a history of  blood clots?" (or: recent major surgery, recent prolonged travel, bedridden)     No 10. OTHER SYMPTOMS: "Do you have any other symptoms?" (e.g., runny nose, wheezing, chest pain)       Runny nose 11. PREGNANCY: "Is there any chance you are pregnant?" "When was your last menstrual period?"       No 12. TRAVEL: "Have you traveled out of the country in the last month?" (e.g., travel history, exposures)       No  Protocols used: Cough - Acute Productive-A-AH

## 2021-11-03 ENCOUNTER — Encounter: Payer: 59 | Admitting: Internal Medicine

## 2021-11-04 ENCOUNTER — Encounter: Payer: Self-pay | Admitting: Internal Medicine

## 2021-11-08 ENCOUNTER — Other Ambulatory Visit: Payer: Self-pay | Admitting: Internal Medicine

## 2021-11-08 NOTE — Telephone Encounter (Signed)
Requested Prescriptions  Pending Prescriptions Disp Refills   sertraline (ZOLOFT) 25 MG tablet [Pharmacy Med Name: Sertraline HCl 25 MG Oral Tablet] 90 tablet 0    Sig: Take 1 tablet by mouth once daily     Psychiatry:  Antidepressants - SSRI Failed - 11/08/2021  1:40 PM      Failed - Completed PHQ-2 or PHQ-9 in the last 360 days      Passed - Valid encounter within last 6 months    Recent Outpatient Visits          1 month ago Bacterial conjunctivitis of right eye   [place], Coralie Keens, NP   6 months ago Acquired hypothyroidism   [place], NP      Future Appointments            In 2 weeks Garn[facility] [place], Coralie Keens, NP [facility], [facility]

## 2021-11-21 ENCOUNTER — Other Ambulatory Visit: Payer: Self-pay | Admitting: Internal Medicine

## 2021-11-21 NOTE — Telephone Encounter (Signed)
Requested Prescriptions  Pending Prescriptions Disp Refills   hydrOXYzine (VISTARIL) 25 MG capsule [Pharmacy Med Name: hydrOXYzine Pamoate 25 MG Oral Capsule] 90 capsule 0    Sig: TAKE 1 CAPSULE BY MOUTH EVERY 8 HOURS AS NEEDED     Ear, Nose, and Throat:  Antihistamines 2 Passed - 11/21/2021  2:13 PM      Passed - Cr in normal range and within 360 days    Creat  Date Value Ref Range Status  05/06/2021 0.70 0.50 - 1.03 mg/dL Final         Passed - Valid encounter within last 12 months    Recent Outpatient Visits          1 month ago Bacterial conjunctivitis of right eye   [place], Coralie Keens, NP   6 months ago Acquired hypothyroidism   [place], NP      Future Appointments            In 3 days [place], Coralie Keens, NP [facility], [facility]

## 2021-11-24 ENCOUNTER — Other Ambulatory Visit: Payer: Self-pay

## 2021-11-24 ENCOUNTER — Encounter: Payer: Self-pay | Admitting: Internal Medicine

## 2021-11-24 ENCOUNTER — Ambulatory Visit (INDEPENDENT_AMBULATORY_CARE_PROVIDER_SITE_OTHER): Payer: 59 | Admitting: Internal Medicine

## 2021-11-24 VITALS — BP 139/80 | HR 91 | Temp 97.3°F | Ht 68.0 in | Wt 316.0 lb

## 2021-11-24 DIAGNOSIS — F32A Depression, unspecified: Secondary | ICD-10-CM

## 2021-11-24 DIAGNOSIS — L02416 Cutaneous abscess of left lower limb: Secondary | ICD-10-CM

## 2021-11-24 DIAGNOSIS — Z1211 Encounter for screening for malignant neoplasm of colon: Secondary | ICD-10-CM

## 2021-11-24 DIAGNOSIS — Z0001 Encounter for general adult medical examination with abnormal findings: Secondary | ICD-10-CM | POA: Diagnosis not present

## 2021-11-24 DIAGNOSIS — H1012 Acute atopic conjunctivitis, left eye: Secondary | ICD-10-CM | POA: Diagnosis not present

## 2021-11-24 DIAGNOSIS — F419 Anxiety disorder, unspecified: Secondary | ICD-10-CM

## 2021-11-24 DIAGNOSIS — D751 Secondary polycythemia: Secondary | ICD-10-CM | POA: Diagnosis not present

## 2021-11-24 DIAGNOSIS — E039 Hypothyroidism, unspecified: Secondary | ICD-10-CM | POA: Diagnosis not present

## 2021-11-24 DIAGNOSIS — E1165 Type 2 diabetes mellitus with hyperglycemia: Secondary | ICD-10-CM

## 2021-11-24 DIAGNOSIS — Z1159 Encounter for screening for other viral diseases: Secondary | ICD-10-CM

## 2021-11-24 MED ORDER — OLOPATADINE HCL 0.1 % OP SOLN: 1.0000 [drp] | Freq: Every day | OPHTHALMIC | 0 refills | Status: DC

## 2021-11-24 MED ORDER — SERTRALINE HCL 50 MG PO TABS: 50.0000 mg | ORAL_TABLET | Freq: Every day | ORAL | 0 refills | Status: DC

## 2021-11-24 MED ORDER — SULFAMETHOXAZOLE-TRIMETHOPRIM 800-160 MG PO TABS: 1.0000 | ORAL_TABLET | Freq: Two times a day (BID) | ORAL | 0 refills | Status: DC

## 2021-11-24 NOTE — Progress Notes (Signed)
Subjective:    Patient ID: Donna Whitaker, female    DOB: 12/26/1970, 51 y.o.   MRN: [id number]  HPI  Patient presents to clinic today for her annual exam.  She reports a boil to her left thigh.  She noticed this 2 weeks ago.  The area is tender but she has not noticed any drainage from the area.  She has tried warm compresses with minimal relief of symptoms.  She denies fever, chills, body aches, nausea or vomiting.  She also reports left eye redness and watering.  She noticed this 1 week ago.  She denies eye pain, itching or discharge.  She has not tried anything OTC for this.  Flu: Never Tetanus: 11/2017 COVID: Never Pneumovax: Never Shingrix: Never Pap smear: 11/2017 Mammogram: 07/2020 Colon screening: Never  Vision screening: as needed Dentist: as needed  Diet: She does eat meat. She consumes some fruits and veggies. She does eat some fried foods. She drinks mostly Dt. Pepsi, coffee, lemonade and energy drinks Exercise: None  Review of Systems     Past Medical History:  Diagnosis Date   Allergy    Anxiety    Arthritis    knees, back   COPD (chronic obstructive pulmonary disease) (HCC)    uses albuterol as needed   Depression    Diabetes mellitus without complication (HCC)    Heavy smoker    2ppd   History of chicken pox    Hypertension    Hypothyroidism    Intraductal papilloma of breast, left     Current Outpatient Medications  Medication Sig Dispense Refill   albuterol (PROVENTIL HFA;VENTOLIN HFA) 108 (90 Base) MCG/ACT inhaler Inhale 2 puffs into the lungs every 6 (six) hours as needed for wheezing or shortness of breath. 1 Inhaler 0   albuterol (VENTOLIN HFA) 108 (90 Base) MCG/ACT inhaler Inhale 1-2 puffs into the lungs every 6 (six) hours as needed for wheezing or shortness of breath. 18 g 0   benzonatate (TESSALON) 100 MG capsule Take 1 capsule (100 mg total) by mouth 3 (three) times daily as needed. 30 capsule 0   Cholecalciferol (EQL VITAMIN D3) 50  MCG (2000 UT) CAPS Take by mouth.     doxycycline (VIBRA-TABS) 100 MG tablet Take 1 tablet (100 mg total) by mouth 2 (two) times daily. 20 tablet 0   fexofenadine (ALLEGRA) 180 MG tablet Take 180 mg by mouth daily.     furosemide (LASIX) 20 MG tablet TAKE 1 TABLET BY MOUTH ONCE DAILY . APPOINTMENT REQUIRED FOR FUTURE REFILLS 90 tablet 0   glipiZIDE (GLUCOTROL) 5 MG tablet TAKE 1 TABLET (5 MG TOTAL) BY MOUTH 2 (TWO) TIMES DAILY BEFORE A MEAL. (Patient taking differently: Take 5 mg by mouth daily before breakfast.) 180 tablet 0   hydrOXYzine (VISTARIL) 25 MG capsule TAKE 1 CAPSULE BY MOUTH EVERY 8 HOURS AS NEEDED 90 capsule 0   ibuprofen (ADVIL,MOTRIN) 200 MG tablet Take 800 mg by mouth daily as needed.     Lancets (ONETOUCH ULTRASOFT) lancets 1 each by Other route in the morning and at bedtime. 200 each 1   levothyroxine (SYNTHROID) 125 MCG tablet Take 1 tablet (125 mcg total) by mouth daily. 90 tablet 3   lidocaine (XYLOCAINE) 2 % solution 31m swish and swallow every 4 hours as needed for sore throat 100 mL 0   losartan (COZAAR) 50 MG tablet Take 1 tablet by mouth once daily 90 tablet 2   ONETOUCH ULTRA test strip 1 EACH BY OTHER  ROUTE IN THE MORNING AND AT BEDTIME. 200 strip 1   sertraline (ZOLOFT) 25 MG tablet Take 1 tablet by mouth once daily 90 tablet 0   trimethoprim-polymyxin b (POLYTRIM) ophthalmic solution Place 1 drop into the right eye every 4 (four) hours. 10 mL 0   No current facility-administered medications for this visit.    Allergies  Allergen Reactions   Betadine [Povidone Iodine] Other (See Comments)    Skin irritation   Vicodin [Hydrocodone-Acetaminophen] Palpitations    Chest pain   Hydrocodone-Acetaminophen    Iodine     Family History  Problem Relation Age of Onset   Arthritis Mother    Hyperlipidemia Mother    Alcohol abuse Father    Hyperlipidemia Father    Heart disease Father    Hypertension Father    Arthritis Maternal Grandmother    Hyperlipidemia  Maternal Grandmother    Heart disease Maternal Grandmother    Hypertension Maternal Grandmother    Diabetes Maternal Grandmother    Hyperlipidemia Maternal Grandfather    Hypertension Maternal Grandfather    Hyperlipidemia Paternal Grandmother    Hypertension Paternal Grandmother    Stomach cancer Paternal Grandmother    Alcohol abuse Paternal Grandfather    Hyperlipidemia Paternal Grandfather    Heart disease Paternal Grandfather    Hypertension Paternal Grandfather     Social History   Socioeconomic History   Marital status: Not on file    Spouse name: Not on file   Number of children: Not on file   Years of education: Not on file   Highest education level: Not on file  Occupational History   Not on file  Tobacco Use   Smoking status: Not on file   Smokeless tobacco: Never  Substance and Sexual Activity   Alcohol use: Yes    Comment: occasional   Drug use: No   Sexual activity: Yes    Birth control/protection: Post-menopausal, Surgical    Comment: BTL  Other Topics Concern   Not on file  Social History Narrative   ** Merged History Encounter **       Social Determinants of Health   Financial Resource Strain: Not on file  Food Insecurity: Not on file  Transportation Needs: Not on file  Physical Activity: Not on file  Stress: Not on file  Social Connections: Not on file  Intimate Partner Violence: Not on file     Constitutional: Patient reports fatigue.  Denies fever, malaise, headache or abrupt weight changes.  HEENT: Patient reports left eye redness and watering.  Denies eye pain, ear pain, ringing in the ears, wax buildup, runny nose, nasal congestion, bloody nose, or sore throat. Respiratory: Patient reports chronic cough and shortness of breath.  Denies difficulty breathing.   Cardiovascular: Denies chest pain, chest tightness, palpitations or swelling in the hands or feet.  Gastrointestinal: Denies abdominal pain, bloating, constipation, diarrhea or  blood in the stool.  GU: Denies urgency, frequency, pain with urination, burning sensation, blood in urine, odor or discharge. Musculoskeletal: Patient reports joint pain.  Denies decrease in range of motion, difficulty with gait, muscle pain or joint swelling.  Skin: Patient reports abscess of left inner thigh.  Denies rashes, or ulcercations.  Neurological: Patient reports insomnia.  Denies dizziness, difficulty with memory, difficulty with speech or problems with balance and coordination.  Psych: Patient has a history of anxiety and depression.  Denies SI/HI.  No other specific complaints in a complete review of systems (except as listed in HPI above).  Objective:   Physical Exam  BP 139/80    Pulse 91    Temp (!) 97.3 F (36.3 C) (Temporal)    Ht _0  (1.727 m)    Wt (!) 316 lb (143.3 kg)    LMP 05/19/2016 Comment: BTL   SpO2 96%    BMI 48.05 kg/m   Wt Readings from Last 3 Encounters:  05/06/21 (!) 310 lb 12.8 oz (141 kg)  08/08/20 296 lb 1.2 oz (134.3 kg)  06/18/20 (!) 309 lb (140.2 kg)    General: Appears her stated age, obese, in NAD. Skin: Warm, dry and intact.  5 cm x 2 cm abscess noted to left medial thigh.  Nonfluctuant.  No surrounding cellulitis. HEENT: Head: normal shape and size; Left Eye: sclera injected, no icterus, conjunctiva pink, PERRLA and EOMs intact;  Neck:  Neck supple, trachea midline. No masses, lumps or thyromegaly present.  Cardiovascular: Normal rate and rhythm. S1,S2 noted.  No murmur, rubs or gallops noted. No JVD or BLE edema. No carotid bruits noted. Pulmonary/Chest: Normal effort and positive vesicular breath sounds. No respiratory distress. No wheezes, rales or ronchi noted.  Abdomen: Soft and nontender. Normal bowel sounds.  Musculoskeletal: Strength 5/5 BUE/BLE.  No difficulty with gait.  Neurological: Alert and oriented. Cranial nerves II-XII grossly intact. Coordination normal.  Psychiatric: Mood and affect flat. Behavior is normal. Judgment and  thought content normal.    BMET    Component Value Date/Time   NA 138 05/06/2021 0910   K 4.7 05/06/2021 0910   CL 100 05/06/2021 0910   CO2 34 (H) 05/06/2021 0910   GLUCOSE 173 (H) 05/06/2021 0910   BUN 13 05/06/2021 0910   CREATININE 0.70 05/06/2021 0910   CALCIUM 9.8 05/06/2021 0910   GFRNONAA >60 08/05/2020 0820    Lipid Panel     Component Value Date/Time   CHOL 184 05/06/2021 0910   TRIG 137 05/06/2021 0910   HDL 50 05/06/2021 0910   CHOLHDL 3.7 05/06/2021 0910   VLDL 20.6 04/30/2020 0921   LDLCALC 109 (H) 05/06/2021 0910    CBC    Component Value Date/Time   WBC 10.3 05/06/2021 0910   RBC 5.15 (H) 05/06/2021 0910   HGB 16.1 (H) 05/06/2021 0910   HCT 48.6 (H) 05/06/2021 0910   PLT 283 05/06/2021 0910   MCV 94.4 05/06/2021 0910   MCH 31.3 05/06/2021 0910   MCHC 33.1 05/06/2021 0910   RDW 12.1 05/06/2021 0910    Hgb A1C Lab Results  Component Value Date   HGBA1C 6.7 (H) 05/06/2021           Assessment & Plan:   Preventative Health Maintenance:  She declines flu shot Tetanus UTD She declines Pneumovax She declines COVID-vaccine Discussed Shingrix vaccine, she will check coverage with her insurance company Pap smear UTD She declines referral for mammogram at this time Cologuard ordered She declines referral to GI for screening colonoscopy or Cologuard at this time Encouraged her to consume a balanced diet and exercise regimen Advised her to see an eye doctor and dentist annually We will check CBC, c-Met, TSH, free T4, lipid, A1c and urine microalbumin and hep C today  Abscess of Left Medial Thigh:  Encourage warm compresses 3 times daily Rx for Septra DS 1 tab twice daily x10 days  Allergic Conjunctivitis, Left Eye:  Apply cool compresses 3 times daily Rx for Pataday 1 drop daily to left eye   RTC in 6 months, follow-up chronic conditions Webb Silversmith, NP  This visit occurred during the SARS-CoV-2 public health emergency.  Safety  protocols were in place, including screening questions prior to the visit, additional usage of staff PPE, and extensive cleaning of exam room while observing appropriate contact time as indicated for disinfecting solutions.

## 2021-11-24 NOTE — Assessment & Plan Note (Signed)
Deteriorated Increase Sertraline to 50 mg daily Continue hydroxyzine as needed Support

## 2021-11-24 NOTE — Patient Instructions (Signed)

## 2021-11-24 NOTE — Assessment & Plan Note (Signed)
Encourage diet and exercise for weight loss 

## 2021-11-25 ENCOUNTER — Other Ambulatory Visit: Payer: Self-pay

## 2021-11-25 LAB — HEPATITIS C ANTIBODY
Hepatitis C Ab: NONREACTIVE
SIGNAL TO CUT-OFF: <0.02 (ref <1.00)

## 2021-11-25 LAB — LIPID PANEL
Cholesterol: 199 mg/dL (ref <200)
HDL: 49 mg/dL — ABNORMAL LOW (ref >=50)
LDL Cholesterol (Calc): 125 mg/dL (calc) — ABNORMAL HIGH
Non-HDL Cholesterol (Calc): 150 mg/dL (calc) — ABNORMAL HIGH (ref <130)
Total CHOL/HDL Ratio: 4.1 (calc) (ref <5.0)
Triglycerides: 141 mg/dL (ref <150)

## 2021-11-25 LAB — HEMOGLOBIN A1C
Hgb A1c MFr Bld: 7.6 % of total Hgb — ABNORMAL HIGH (ref <5.7)
Mean Plasma Glucose: 171 mg/dL
eAG (mmol/L): 9.5 mmol/L

## 2021-11-25 LAB — CBC
HCT: 48.1 % — ABNORMAL HIGH (ref 35.0–45.0)
Hemoglobin: 16.1 g/dL — ABNORMAL HIGH (ref 11.7–15.5)
MCH: 30.8 pg (ref 27.0–33.0)
MCHC: 33.5 g/dL (ref 32.0–36.0)
MCV: 92 fL (ref 80.0–100.0)
MPV: 9.7 fL (ref 7.5–12.5)
Platelets: 291 10*3/uL (ref 140–400)
RBC: 5.23 10*6/uL — ABNORMAL HIGH (ref 3.80–5.10)
RDW: 12 % (ref 11.0–15.0)
WBC: 12.2 10*3/uL — ABNORMAL HIGH (ref 3.8–10.8)

## 2021-11-25 LAB — COMPLETE METABOLIC PANEL WITH GFR
AG Ratio: 1.5 (calc) (ref 1.0–2.5)
ALT: 27 U/L (ref 6–29)
AST: 18 U/L (ref 10–35)
Albumin: 4.1 g/dL (ref 3.6–5.1)
Alkaline phosphatase (APISO): 108 U/L (ref 37–153)
BUN: 11 mg/dL (ref 7–25)
CO2: 33 mmol/L — ABNORMAL HIGH (ref 20–32)
Calcium: 9.5 mg/dL (ref 8.6–10.4)
Chloride: 99 mmol/L (ref 98–110)
Creat: 0.62 mg/dL (ref 0.50–1.03)
Globulin: 2.8 g/dL (calc) (ref 1.9–3.7)
Glucose, Bld: 154 mg/dL — ABNORMAL HIGH (ref 65–99)
Potassium: 4.3 mmol/L (ref 3.5–5.3)
Sodium: 139 mmol/L (ref 135–146)
Total Bilirubin: 0.8 mg/dL (ref 0.2–1.2)
Total Protein: 6.9 g/dL (ref 6.1–8.1)
eGFR: 106 mL/min/{1.73_m2} (ref >=60)

## 2021-11-25 LAB — MICROALBUMIN / CREATININE URINE RATIO
Creatinine, Urine: 44 mg/dL (ref 20–275)
Microalb Creat Ratio: 11 mcg/mg creat (ref <30)
Microalb, Ur: 0.5 mg/dL

## 2021-11-25 LAB — TSH: TSH: 3.73 mIU/L

## 2021-11-25 LAB — T4, FREE: Free T4: 1.1 ng/dL (ref 0.8–1.8)

## 2021-11-25 NOTE — Telephone Encounter (Signed)
-----   Message from Jearld Fenton, NP sent at 11/25/2021 11:55 AM EST ----- Blood counts remain elevated but stable.  Does she smoke?  She may want to consider donating blood monthly to reduce her risk of clot.  Her liver and kidney function is normal.  Cholesterol is slightly more elevated.  Would she consider cholesterol-lowering medication at this time in addition to low-fat diet?  Please let me know if she is agreeable and I will send in.  A1c up to 7.6%.  I would recommend increasing glipizide to 10 mg 2 times daily.  Let me know if she is agreeable and I will send this in.  She should follow-up with me in 3 months.

## 2021-11-25 NOTE — Telephone Encounter (Signed)
Pt advised.  She agreed to a cholesterol medication and increasing glipizide to 10mg  twice a day.  Please sent to [facility] on [address].  Apt scheduled for 02/23/2022.     Thanks,   -Mickel Baas

## 2021-11-26 MED ORDER — GLIPIZIDE 10 MG PO TABS: 10.0000 mg | ORAL_TABLET | Freq: Two times a day (BID) | ORAL | 0 refills | Status: DC

## 2021-11-26 MED ORDER — SIMVASTATIN 10 MG PO TABS: 10.0000 mg | ORAL_TABLET | Freq: Every day | ORAL | 0 refills | Status: DC

## 2021-12-02 ENCOUNTER — Encounter (HOSPITAL_COMMUNITY): Payer: Self-pay

## 2022-01-06 ENCOUNTER — Other Ambulatory Visit: Payer: Self-pay | Admitting: Internal Medicine

## 2022-01-08 ENCOUNTER — Other Ambulatory Visit: Payer: Self-pay

## 2022-01-08 NOTE — Telephone Encounter (Signed)
Requested Prescriptions  ?Pending Prescriptions Disp Refills  ?? hydrOXYzine (VISTARIL) 25 MG capsule [Pharmacy Med Name: hydrOXYzine Pamoate 25 MG Oral Capsule] 90 capsule 0  ?  Sig: TAKE 1 CAPSULE BY MOUTH EVERY 8 HOURS AS NEEDED  ?  ? Ear, Nose, and Throat:  Antihistamines 2 Passed - 01/06/2022  2:36 PM  ?  ?  Passed - Cr in normal range and within 360 days  ?  Creat  ?Date Value Ref Range Status  ?11/24/2021 0.62 0.50 - 1.03 mg/dL Final  ? ?Creatinine, Urine  ?Date Value Ref Range Status  ?11/24/2021 44 20 - 275 mg/dL Final  ?   ?  ?  Passed - Valid encounter within last 12 months  ?  Recent Outpatient Visits   ?      ? 1 month ago Encounter for general adult medical examination with abnormal findings  ? [facility] [place], [place] W, NP  ? 3 months ago Bacterial conjunctivitis of right eye  ? [facility] [place], [place] W, NP  ? 8 months ago Acquired hypothyroidism  ? [facility] [place], Coralie Keens, NP  ?  ?  ?Future Appointments   ?        ? In 1 month Baity, Coralie Keens, NP [facility], [place]  ? In 4 months Baity, Coralie Keens, NP [facility], [place]  ?  ? ?  ?  ?  ? ? ?

## 2022-01-29 ENCOUNTER — Other Ambulatory Visit: Payer: Self-pay | Admitting: Internal Medicine

## 2022-01-29 NOTE — Telephone Encounter (Signed)
Requested Prescriptions  ?Pending Prescriptions Disp Refills  ?? losartan (COZAAR) 50 MG tablet [Pharmacy Med Name: Losartan Potassium 50 MG Oral Tablet] 90 tablet 0  ?  Sig: Take 1 tablet by mouth once daily  ?  ? Cardiovascular:  Angiotensin Receptor Blockers Passed - 01/29/2022 12:27 PM  ?  ?  Passed - Cr in normal range and within 180 days  ?  Creat  ?Date Value Ref Range Status  ?11/24/2021 0.62 0.50 - 1.03 mg/dL Final  ? ?Creatinine, Urine  ?Date Value Ref Range Status  ?11/24/2021 44 20 - 275 mg/dL Final  ?   ?  ?  Passed - K in normal range and within 180 days  ?  Potassium  ?Date Value Ref Range Status  ?11/24/2021 4.3 3.5 - 5.3 mmol/L Final  ?   ?  ?  Passed - Patient is not pregnant  ?  ?  Passed - Last BP in normal range  ?  BP Readings from Last 1 Encounters:  ?11/24/21 139/80  ?   ?  ?  Passed - Valid encounter within last 6 months  ?  Recent Outpatient Visits   ?      ? 2 months ago Encounter for general adult medical examination with abnormal findings  ? [facility] [place], [place] W, NP  ? 3 months ago Bacterial conjunctivitis of right eye  ? [facility] [place], [place] W, NP  ? 8 months ago Acquired hypothyroidism  ? [facility] [place], Coralie Keens, NP  ?  ?  ?Future Appointments   ?        ? In 3 weeks Baity, Coralie Keens, NP [facility], [place]  ? In 3 months Baity, Coralie Keens, NP [facility], [place]  ?  ? ?  ?  ?  ? ? ?

## 2022-02-20 ENCOUNTER — Other Ambulatory Visit: Payer: Self-pay | Admitting: Internal Medicine

## 2022-02-20 NOTE — Telephone Encounter (Signed)
Requested Prescriptions  ?Pending Prescriptions Disp Refills  ?? sertraline (ZOLOFT) 50 MG tablet [Pharmacy Med Name: Sertraline HCl 50 MG Oral Tablet] 90 tablet 0  ?  Sig: Take 1 tablet by mouth once daily  ?  ? Psychiatry:  Antidepressants - SSRI - sertraline Passed - 02/20/2022  5:30 AM  ?  ?  Passed - AST in normal range and within 360 days  ?  AST  ?Date Value Ref Range Status  ?11/24/2021 18 10 - 35 U/L Final  ?   ?  ?  Passed - ALT in normal range and within 360 days  ?  ALT  ?Date Value Ref Range Status  ?11/24/2021 27 6 - 29 U/L Final  ?   ?  ?  Passed - Completed PHQ-2 or PHQ-9 in the last 360 days  ?  ?  Passed - Valid encounter within last 6 months  ?  Recent Outpatient Visits   ?      ? 2 months ago Encounter for general adult medical examination with abnormal findings  ? [facility] [place], [place] W, NP  ? 4 months ago Bacterial conjunctivitis of right eye  ? [facility] [place], [place] W, NP  ? 9 months ago Acquired hypothyroidism  ? [facility] [place], Coralie Keens, NP  ?  ?  ?Future Appointments   ?        ? In 3 days Baity, Coralie Keens, NP [facility], [place]  ? In 3 months Baity, Coralie Keens, NP [facility], [place]  ?  ? ?  ?  ?  ?? hydrOXYzine (VISTARIL) 25 MG capsule [Pharmacy Med Name: hydrOXYzine Pamoate 25 MG Oral Capsule] 90 capsule 0  ?  Sig: TAKE 1 CAPSULE BY MOUTH EVERY 8 HOURS AS NEEDED  ?  ? Ear, Nose, and Throat:  Antihistamines 2 Passed - 02/20/2022  5:30 AM  ?  ?  Passed - Cr in normal range and within 360 days  ?  Creat  ?Date Value Ref Range Status  ?11/24/2021 0.62 0.50 - 1.03 mg/dL Final  ? ?Creatinine, Urine  ?Date Value Ref Range Status  ?11/24/2021 44 20 - 275 mg/dL Final  ?   ?  ?  Passed - Valid encounter within last 12 months  ?  Recent Outpatient Visits   ?      ? 2 months ago Encounter for general adult medical examination with abnormal findings  ? [facility] [place], [place] W, NP  ? 4 months ago  Bacterial conjunctivitis of right eye  ? [facility] [place], [place] W, NP  ? 9 months ago Acquired hypothyroidism  ? [facility] [place], Coralie Keens, NP  ?  ?  ?Future Appointments   ?        ? In 3 days Baity, Coralie Keens, NP [facility], [place]  ? In 3 months Baity, Coralie Keens, NP [facility], [place]  ?  ? ?  ?  ?  ? ? ?

## 2022-02-23 ENCOUNTER — Encounter: Payer: Self-pay | Admitting: Internal Medicine

## 2022-02-23 ENCOUNTER — Ambulatory Visit (INDEPENDENT_AMBULATORY_CARE_PROVIDER_SITE_OTHER): Payer: 59 | Admitting: Internal Medicine

## 2022-02-23 VITALS — BP 138/84 | HR 76 | Temp 96.9°F | Wt 312.0 lb

## 2022-02-23 DIAGNOSIS — E1165 Type 2 diabetes mellitus with hyperglycemia: Secondary | ICD-10-CM | POA: Diagnosis not present

## 2022-02-23 DIAGNOSIS — E039 Hypothyroidism, unspecified: Secondary | ICD-10-CM | POA: Diagnosis not present

## 2022-02-23 DIAGNOSIS — F419 Anxiety disorder, unspecified: Secondary | ICD-10-CM

## 2022-02-23 DIAGNOSIS — M2550 Pain in unspecified joint: Secondary | ICD-10-CM | POA: Diagnosis not present

## 2022-02-23 DIAGNOSIS — F32A Depression, unspecified: Secondary | ICD-10-CM

## 2022-02-23 DIAGNOSIS — J432 Centrilobular emphysema: Secondary | ICD-10-CM

## 2022-02-23 DIAGNOSIS — Z1211 Encounter for screening for malignant neoplasm of colon: Secondary | ICD-10-CM

## 2022-02-23 DIAGNOSIS — I10 Essential (primary) hypertension: Secondary | ICD-10-CM

## 2022-02-23 DIAGNOSIS — F5101 Primary insomnia: Secondary | ICD-10-CM

## 2022-02-23 DIAGNOSIS — D751 Secondary polycythemia: Secondary | ICD-10-CM

## 2022-02-23 DIAGNOSIS — M159 Polyosteoarthritis, unspecified: Secondary | ICD-10-CM

## 2022-02-23 LAB — POCT GLYCOSYLATED HEMOGLOBIN (HGB A1C): Hemoglobin A1C: 7.5 % — AB (ref 4.0–5.6)

## 2022-02-23 MED ORDER — CELECOXIB 50 MG PO CAPS: 50.0000 mg | ORAL_CAPSULE | Freq: Two times a day (BID) | ORAL | 0 refills | Status: DC

## 2022-02-23 NOTE — Patient Instructions (Signed)

## 2022-02-23 NOTE — Assessment & Plan Note (Signed)
Controlled on Losartan ?Reinforced DASH diet and exercise for weight loss ?C-Met today ?

## 2022-02-23 NOTE — Assessment & Plan Note (Signed)
Persistent ?Support offered ?Continue Sertraline and Hydroxyzine as prescribed ?

## 2022-02-23 NOTE — Assessment & Plan Note (Signed)
Continue Hydroxyzine as needed ?

## 2022-02-23 NOTE — Assessment & Plan Note (Signed)
Deteriorated ?Stop Ibuprofen OTC ?Rx for Celebrex 50 mg twice daily ?

## 2022-02-23 NOTE — Assessment & Plan Note (Signed)
POCT A1c 7.5% ?Urine microalbumin today ?Encouraged her to consume a low-carb diet and exercise for weight loss ?Advised her to increase her Glipizide to 10 mg twice daily as prescribed ?Referral to ophthalmology placed for diabetic eye exam ?Encourage routine foot exam ?She refuses immunizations ?

## 2022-02-23 NOTE — Assessment & Plan Note (Signed)
Encouraged smoking cessation. ?Continue Albuterol as needed ?

## 2022-02-23 NOTE — Assessment & Plan Note (Signed)
CBC daily ?Encourage smoking cessation ?

## 2022-02-23 NOTE — Assessment & Plan Note (Signed)
Encourage diet and exercise for weight loss 

## 2022-02-23 NOTE — Progress Notes (Signed)
? ?Subjective:  ? ? Patient ID: Donna Whitaker, female    DOB: 18-Jun-1971, 51 y.o.   MRN: [id number] ? ?HPI ? ?Patient presents to clinic today for follow-up of chronic conditions. ? ?Hypothyroidism: She denies any issues on her current dose of Levothyroxine.  She does not follow with endocrinology. ? ?Insomnia: She has difficulty staying asleep.  She is taking Hydroxyzine at bedtime with minimal relief of symptoms.  There is no sleep study on file. ? ?Anxiety and Depression: Chronic secondary to family issues.  She is taking Sertraline and Hydroxyzine as prescribed but does not feel like it is effective.  She is not currently seeing a therapist.  She denies SI/HI. ? ?OA: Mainly in her back and knees. She reports her pain is worse lately. She is taking Ibuprofen OTC as needed with some relief of symptoms.  She does not follow with orthopedics. ? ?COPD: She reports chronic cough but denies shortness of breath.  She uses Albuterol inhaler only as needed.  There are no PFTs on file.  She does continue to smoke. ? ?HTN: Her BP today is 138/84.  She is taking Losartan as prescribed, Furosemide (as needed).  ECG from 07/2020 reviewed. ? ?DM 2: Her last A1c was 7.6%, 12/2021.  She is taking Glipizide as prescribed.  She has not checked her sugars.  Her last eye exam was > 1 year ago.  She does not check her feet routinely.  She does not take flu, pneumonia or COVID vaccines. ? ?HLD: Her last LDL was 125, triglycerides 141, 02/2022.  She dis not taking Simvastatin due to nausea.  She does not consume a low-fat diet. ? ?Polycythemia: Her last H/H was 16.1/48.1, 12/2021.  She does not follow with hematology. ? ?Review of Systems ? ?   ?Past Medical History:  ?Diagnosis Date  ? Allergy   ? Anxiety   ? Arthritis   ? knees, back  ? COPD (chronic obstructive pulmonary disease) ([place])   ? uses albuterol as needed  ? Depression   ? Diabetes mellitus without complication ([address])   ? Heavy smoker   ? 2ppd  ? History of chicken pox   ?  Hypertension   ? Hypothyroidism   ? Intraductal papilloma of breast, left   ? ? ?Current Outpatient Medications  ?Medication Sig Dispense Refill  ? albuterol (VENTOLIN HFA) 108 (90 Base) MCG/ACT inhaler Inhale 1-2 puffs into the lungs every 6 (six) hours as needed for wheezing or shortness of breath. 18 g 0  ? benzonatate (TESSALON) 100 MG capsule Take 1 capsule (100 mg total) by mouth 3 (three) times daily as needed. 30 capsule 0  ? Cholecalciferol (EQL VITAMIN D3) 50 MCG (2000 UT) CAPS Take by mouth.    ? fexofenadine (ALLEGRA) 180 MG tablet Take 180 mg by mouth daily.    ? furosemide (LASIX) 20 MG tablet TAKE 1 TABLET BY MOUTH ONCE DAILY . APPOINTMENT REQUIRED FOR FUTURE REFILLS 90 tablet 0  ? glipiZIDE (GLUCOTROL) 10 MG tablet Take 1 tablet (10 mg total) by mouth 2 (two) times daily before a meal. 180 tablet 0  ? hydrOXYzine (VISTARIL) 25 MG capsule TAKE 1 CAPSULE BY MOUTH EVERY 8 HOURS AS NEEDED 90 capsule 0  ? ibuprofen (ADVIL,MOTRIN) 200 MG tablet Take 800 mg by mouth daily as needed.    ? Lancets (ONETOUCH ULTRASOFT) lancets 1 each by Other route in the morning and at bedtime. 200 each 1  ? levothyroxine (SYNTHROID) 125 MCG tablet Take 1  tablet (125 mcg total) by mouth daily. 90 tablet 3  ? lidocaine (XYLOCAINE) 2 % solution 18m swish and swallow every 4 hours as needed for sore throat 100 mL 0  ? losartan (COZAAR) 50 MG tablet Take 1 tablet by mouth once daily 90 tablet 0  ? olopatadine (PATADAY) 0.1 % ophthalmic solution Place 1 drop into the left eye daily. 5 mL 0  ? ONETOUCH ULTRA test strip 1 EACH BY OTHER ROUTE IN THE MORNING AND AT BEDTIME. 200 strip 1  ? sertraline (ZOLOFT) 50 MG tablet Take 1 tablet by mouth once daily 90 tablet 0  ? simvastatin (ZOCOR) 10 MG tablet Take 1 tablet (10 mg total) by mouth at bedtime. 90 tablet 0  ? sulfamethoxazole-trimethoprim (BACTRIM DS) 800-160 MG tablet Take 1 tablet by mouth 2 (two) times daily. 20 tablet 0  ? trimethoprim-polymyxin b (POLYTRIM) ophthalmic  solution Place 1 drop into the right eye every 4 (four) hours. 10 mL 0  ? ?No current facility-administered medications for this visit.  ? ? ?Allergies  ?Allergen Reactions  ? Betadine [Povidone Iodine] Other (See Comments)  ?  Skin irritation  ? Vicodin [Hydrocodone-Acetaminophen] Palpitations  ?  Chest pain  ? Hydrocodone-Acetaminophen   ? Iodine   ? ? ?Family History  ?Problem Relation Age of Onset  ? Arthritis Mother   ? Hyperlipidemia Mother   ? Alcohol abuse Father   ? Hyperlipidemia Father   ? Heart disease Father   ? Hypertension Father   ? Arthritis Maternal Grandmother   ? Hyperlipidemia Maternal Grandmother   ? Heart disease Maternal Grandmother   ? Hypertension Maternal Grandmother   ? Diabetes Maternal Grandmother   ? Hyperlipidemia Maternal Grandfather   ? Hypertension Maternal Grandfather   ? Hyperlipidemia Paternal Grandmother   ? Hypertension Paternal Grandmother   ? Stomach cancer Paternal Grandmother   ? Alcohol abuse Paternal Grandfather   ? Hyperlipidemia Paternal Grandfather   ? Heart disease Paternal Grandfather   ? Hypertension Paternal Grandfather   ? ? ?Social History  ? ?Socioeconomic History  ? Marital status: Married  ?  Spouse name: Not on file  ? Number of children: Not on file  ? Years of education: Not on file  ? Highest education level: Not on file  ?Occupational History  ? Not on file  ?Tobacco Use  ? Smoking status: Not on file  ? Smokeless tobacco: Never  ?Substance and Sexual Activity  ? Alcohol use: Yes  ?  Comment: occasional  ? Drug use: No  ? Sexual activity: Yes  ?  Birth control/protection: Post-menopausal, Surgical  ?  Comment: BTL  ?Other Topics Concern  ? Not on file  ?Social History Narrative  ? ** Merged History Encounter **  ?    ? ?Social Determinants of Health  ? ?Financial Resource Strain: Not on file  ?Food Insecurity: Not on file  ?Transportation Needs: Not on file  ?Physical Activity: Not on file  ?Stress: Not on file  ?Social Connections: Not on file   ?Intimate Partner Violence: Not on file  ? ? ? ?Constitutional: Denies fever, malaise, fatigue, headache or abrupt weight changes.  ?HEENT: Denies eye pain, eye redness, ear pain, ringing in the ears, wax buildup, runny nose, nasal congestion, bloody nose, or sore throat. ?Respiratory: Patient reports chronic cough.  Denies difficulty breathing, shortness of breath, cough or sputum production.   ?Cardiovascular: Denies chest pain, chest tightness, palpitations or swelling in the hands or feet.  ?Gastrointestinal: Denies abdominal  pain, bloating, constipation, diarrhea or blood in the stool.  ?GU: Denies urgency, frequency, pain with urination, burning sensation, blood in urine, odor or discharge. ?Musculoskeletal: Patient reports multiple joint pain.  Denies decrease in range of motion, difficulty with gait, muscle pain or joint swelling.  ?Skin: Denies redness, rashes, lesions or ulcercations.  ?Neurological: Patient reports insomnia, intermittent neuropathic pain.  Denies dizziness, difficulty with memory, difficulty with speech or problems with balance and coordination.  ?Psych: Patient has a history of anxiety and depression.  Denies SI/HI. ? ?No other specific complaints in a complete review of systems (except as listed in HPI above). ? ?Objective:  ? Physical Exam ? ?BP 138/84 (BP Location: Left Arm, Patient Position: Sitting, Cuff Size: Large)   Pulse 76   Temp (!) 96.9 ?F (36.1 ?C) (Temporal)   Wt (!) 312 lb (141.5 kg)   LMP 05/19/2016 Comment: BTL  SpO2 95%   BMI 47.44 kg/m?  ? ?Wt Readings from Last 3 Encounters:  ?11/24/21 (!) 316 lb (143.3 kg)  ?05/06/21 (!) 310 lb 12.8 oz (141 kg)  ?08/08/20 296 lb 1.2 oz (134.3 kg)  ? ? ?General: Appears her stated age, obese, chronically ill-appearing in NAD. ?Skin: Warm, dry and intact. No ulcerations noted. ?HEENT: Head: normal shape and size; Eyes: sclera white, no icterus, conjunctiva pink, PERRLA and EOMs intact;  ?Neck:  Neck supple, trachea midline. No  masses, lumps or thyromegaly present.  ?Cardiovascular: Normal rate and rhythm. S1,S2 noted.  No murmur, rubs or gallops noted. No JVD or BLE edema. No carotid bruits noted. ?Pulmonary/Chest: Normal effort and positive vesicu

## 2022-02-23 NOTE — Assessment & Plan Note (Signed)
TSH and free T4 today ?She would like a thyroid ultrasound to difficulty swallowing ?

## 2022-02-25 LAB — CBC
HCT: 51 % — ABNORMAL HIGH (ref 35.0–45.0)
Hemoglobin: 16.9 g/dL — ABNORMAL HIGH (ref 11.7–15.5)
MCH: 30.4 pg (ref 27.0–33.0)
MCHC: 33.1 g/dL (ref 32.0–36.0)
MCV: 91.7 fL (ref 80.0–100.0)
MPV: 9.7 fL (ref 7.5–12.5)
Platelets: 289 10*3/uL (ref 140–400)
RBC: 5.56 10*6/uL — ABNORMAL HIGH (ref 3.80–5.10)
RDW: 12 % (ref 11.0–15.0)
WBC: 10.7 10*3/uL (ref 3.8–10.8)

## 2022-02-25 LAB — MICROALBUMIN / CREATININE URINE RATIO
Creatinine, Urine: 213 mg/dL (ref 20–275)
Microalb Creat Ratio: 23 mcg/mg creat (ref <30)
Microalb, Ur: 5 mg/dL

## 2022-02-25 LAB — SEDIMENTATION RATE: Sed Rate: 2 mm/h (ref 0–30)

## 2022-02-25 LAB — TSH: TSH: 3.45 mIU/L

## 2022-02-25 LAB — COMPLETE METABOLIC PANEL WITH GFR
AG Ratio: 1.5 (calc) (ref 1.0–2.5)
ALT: 20 U/L (ref 6–29)
AST: 13 U/L (ref 10–35)
Albumin: 4 g/dL (ref 3.6–5.1)
Alkaline phosphatase (APISO): 98 U/L (ref 37–153)
BUN: 12 mg/dL (ref 7–25)
CO2: 27 mmol/L (ref 20–32)
Calcium: 9.5 mg/dL (ref 8.6–10.4)
Chloride: 101 mmol/L (ref 98–110)
Creat: 0.67 mg/dL (ref 0.50–1.03)
Globulin: 2.6 g/dL (calc) (ref 1.9–3.7)
Glucose, Bld: 194 mg/dL — ABNORMAL HIGH (ref 65–139)
Potassium: 4.6 mmol/L (ref 3.5–5.3)
Sodium: 138 mmol/L (ref 135–146)
Total Bilirubin: 0.7 mg/dL (ref 0.2–1.2)
Total Protein: 6.6 g/dL (ref 6.1–8.1)
eGFR: 104 mL/min/{1.73_m2} (ref >=60)

## 2022-02-25 LAB — LIPID PANEL
Cholesterol: 193 mg/dL (ref <200)
HDL: 49 mg/dL — ABNORMAL LOW (ref >=50)
LDL Cholesterol (Calc): 117 mg/dL (calc) — ABNORMAL HIGH
Non-HDL Cholesterol (Calc): 144 mg/dL (calc) — ABNORMAL HIGH (ref <130)
Total CHOL/HDL Ratio: 3.9 (calc) (ref <5.0)
Triglycerides: 152 mg/dL — ABNORMAL HIGH (ref <150)

## 2022-02-25 LAB — HEMOGLOBIN A1C
Hgb A1c MFr Bld: 7.7 % of total Hgb — ABNORMAL HIGH (ref <5.7)
Mean Plasma Glucose: 174 mg/dL
eAG (mmol/L): 9.7 mmol/L

## 2022-02-25 LAB — ANA: Anti Nuclear Antibody (ANA): NEGATIVE

## 2022-02-25 LAB — T4, FREE: Free T4: 1.1 ng/dL (ref 0.8–1.8)

## 2022-02-25 LAB — C-REACTIVE PROTEIN: CRP: 23.3 mg/L — ABNORMAL HIGH (ref <8.0)

## 2022-02-25 LAB — RHEUMATOID FACTOR: Rheumatoid fact SerPl-aCnc: <14 IU/mL (ref <14)

## 2022-02-26 ENCOUNTER — Ambulatory Visit: Payer: 59

## 2022-03-09 ENCOUNTER — Telehealth: Payer: Self-pay

## 2022-03-09 NOTE — Telephone Encounter (Signed)
Copied from [place] [id number]. Topic: General - Inquiry >> Mar 05, 2022 11:02 AM Oneta Rack wrote: Reason for CRM: Patient received the colo guard kit with letter of instructions and letter and kit reflected  "Donna Whitaker" which is incorrect. It shold reflect Donna Whitaker.  Patient would like to schedule a lab follow up appointment due to most recent labs, requesting orders. Please advise

## 2022-03-11 ENCOUNTER — Ambulatory Visit
Admission: RE | Admit: 2022-03-11 | Discharge: 2022-03-11 | Disposition: A | Discharge status: Home / Self Care | Payer: 59 | Source: Ambulatory Visit | Attending: Internal Medicine | Admitting: Internal Medicine

## 2022-03-11 DIAGNOSIS — E039 Hypothyroidism, unspecified: Secondary | ICD-10-CM | POA: Insufficient documentation

## 2022-03-13 ENCOUNTER — Other Ambulatory Visit: Payer: Self-pay

## 2022-03-13 ENCOUNTER — Other Ambulatory Visit: Payer: 59

## 2022-03-13 DIAGNOSIS — M2550 Pain in unspecified joint: Secondary | ICD-10-CM

## 2022-04-01 ENCOUNTER — Other Ambulatory Visit: Payer: 59

## 2022-04-02 LAB — C-REACTIVE PROTEIN: CRP: 25.2 mg/L — ABNORMAL HIGH (ref <8.0)

## 2022-04-08 ENCOUNTER — Other Ambulatory Visit: Payer: Self-pay | Admitting: Internal Medicine

## 2022-04-08 NOTE — Telephone Encounter (Signed)
Requested Prescriptions  Pending Prescriptions Disp Refills  . hydrOXYzine (VISTARIL) 25 MG capsule [Pharmacy Med Name: hydrOXYzine Pamoate 25 MG Oral Capsule] 90 capsule 0    Sig: TAKE 1 CAPSULE BY MOUTH EVERY 8 HOURS AS NEEDED     Ear, Nose, and Throat:  Antihistamines 2 Passed - 04/08/2022 12:27 PM      Passed - Cr in normal range and within 360 days    Creat  Date Value Ref Range Status  02/23/2022 0.67 0.50 - 1.03 mg/dL Final   Creatinine, Urine  Date Value Ref Range Status  02/23/2022 213 20 - 275 mg/dL Final         Passed - Valid encounter within last 12 months    Recent Outpatient Visits          1 month ago Screening for colon cancer   [place], Coralie Keens, NP   4 months ago Encounter for general adult medical examination with abnormal findings   [facility] [place], Coralie Keens, NP   6 months ago Bacterial conjunctivitis of right eye   [facility] [place], Coralie Keens, NP   11 months ago Acquired hypothyroidism   [place], NP      Future Appointments            In 1 month [place], Coralie Keens, NP [facility], [facility]

## 2022-04-19 ENCOUNTER — Other Ambulatory Visit: Payer: Self-pay | Admitting: Internal Medicine

## 2022-04-20 NOTE — Telephone Encounter (Signed)
Requested Prescriptions  Pending Prescriptions Disp Refills  . losartan (COZAAR) 50 MG tablet [Pharmacy Med Name: Losartan Potassium 50 MG Oral Tablet] 90 tablet 0    Sig: Take 1 tablet by mouth once daily     Cardiovascular:  Angiotensin Receptor Blockers Passed - 04/19/2022 11:21 AM      Passed - Cr in normal range and within 180 days    Creat  Date Value Ref Range Status  02/23/2022 0.67 0.50 - 1.03 mg/dL Final   Creatinine, Urine  Date Value Ref Range Status  02/23/2022 213 20 - 275 mg/dL Final         Passed - K in normal range and within 180 days    Potassium  Date Value Ref Range Status  02/23/2022 4.6 3.5 - 5.3 mmol/L Final         Passed - Patient is not pregnant      Passed - Last BP in normal range    BP Readings from Last 1 Encounters:  02/23/22 138/84         Passed - Valid encounter within last 6 months    Recent Outpatient Visits          1 month ago Screening for colon cancer   [employer], Coralie Keens, NP   4 months ago Encounter for general adult medical examination with abnormal findings   [facility] [place], Coralie Keens, NP   6 months ago Bacterial conjunctivitis of right eye   [facility] [place], Coralie Keens, NP   11 months ago Acquired hypothyroidism   [facility] [place], Coralie Keens, NP      Future Appointments            In 1 month [place], Coralie Keens, NP [facility], [facility]

## 2022-04-25 ENCOUNTER — Other Ambulatory Visit: Payer: Self-pay | Admitting: Family

## 2022-04-25 ENCOUNTER — Other Ambulatory Visit: Payer: Self-pay | Admitting: Internal Medicine

## 2022-04-27 NOTE — Telephone Encounter (Signed)
Can you confirm how patient is taking this?

## 2022-04-27 NOTE — Telephone Encounter (Signed)
Requested medication (s) are due for refill today: yes  Requested medication (s) are on the active medication list:yes  Last refill:  11/26/21  Future visit scheduled: yes  Notes to clinic:  pt takes medication differently: takes 5 mg by mouth 2 times a day. (Ordered 10 mg or 1 tablet two times a day before a meal)   Requested Prescriptions  Pending Prescriptions Disp Refills   glipiZIDE (GLUCOTROL) 10 MG tablet [Pharmacy Med Name: glipiZIDE 10 MG Oral Tablet] 180 tablet 0    Sig: TAKE 1 TABLET BY MOUTH TWICE DAILY BEFORE A MEAL     Endocrinology:  Diabetes - Sulfonylureas Passed - 04/25/2022 10:56 AM      Passed - HBA1C is between 0 and 7.9 and within 180 days    Hemoglobin A1C  Date Value Ref Range Status  02/23/2022 7.5 (A) 4.0 - 5.6 % Final   Hgb A1c MFr Bld  Date Value Ref Range Status  02/23/2022 7.7 (H) <5.7 % of total Hgb Final    Comment:    For someone without known diabetes, a hemoglobin A1c value of 6.5% or greater indicates that they may have  diabetes and this should be confirmed with a follow-up  test. . For someone with known diabetes, a value <7% indicates  that their diabetes is well controlled and a value  greater than or equal to 7% indicates suboptimal  control. A1c targets should be individualized based on  duration of diabetes, age, comorbid conditions, and  other considerations. . Currently, no consensus exists regarding use of hemoglobin A1c for diagnosis of diabetes for children. .          Passed - Cr in normal range and within 360 days    Creat  Date Value Ref Range Status  02/23/2022 0.67 0.50 - 1.03 mg/dL Final   Creatinine, Urine  Date Value Ref Range Status  02/23/2022 213 20 - 275 mg/dL Final         Passed - Valid encounter within last 6 months    Recent Outpatient Visits           2 months ago Screening for colon cancer   [place], Coralie Keens, NP   5 months ago Encounter for general adult medical  examination with abnormal findings   [facility] [place], Coralie Keens, NP   6 months ago Bacterial conjunctivitis of right eye   [facility] [place], Coralie Keens, NP   11 months ago Acquired hypothyroidism   [facility] [place], Coralie Keens, NP       Future Appointments             In 1 month [place], Coralie Keens, NP [facility], [facility]            Signed Prescriptions Disp Refills   levothyroxine (SYNTHROID) 125 MCG tablet 90 tablet 0    Sig: Take 1 tablet by mouth once daily     Endocrinology:  Hypothyroid Agents Passed - 04/25/2022 10:56 AM      Passed - TSH in normal range and within 360 days    TSH  Date Value Ref Range Status  02/23/2022 3.45 mIU/L Final    Comment:              Reference Range .           > or = 20 Years  0.40-4.50 .  Pregnancy Ranges           First trimester    0.26-2.66           Second trimester   0.55-2.73           Third trimester    0.43-2.91          Passed - Valid encounter within last 12 months    Recent Outpatient Visits           2 months ago Screening for colon cancer   [place], Coralie Keens, NP   5 months ago Encounter for general adult medical examination with abnormal findings   [facility] [place], Coralie Keens, NP   6 months ago Bacterial conjunctivitis of right eye   [facility] [place], Coralie Keens, NP   11 months ago Acquired hypothyroidism   [facility] [place], Coralie Keens, NP       Future Appointments             In 1 month [place], Coralie Keens, NP [facility], [facility]

## 2022-04-27 NOTE — Telephone Encounter (Signed)
Requested Prescriptions  Pending Prescriptions Disp Refills  . levothyroxine (SYNTHROID) 125 MCG tablet [Pharmacy Med Name: Levothyroxine Sodium 125 MCG Oral Tablet] 90 tablet 0    Sig: Take 1 tablet by mouth once daily     Endocrinology:  Hypothyroid Agents Passed - 04/25/2022 10:56 AM      Passed - TSH in normal range and within 360 days    TSH  Date Value Ref Range Status  02/23/2022 3.45 mIU/L Final    Comment:              Reference Range .           > or = 20 Years  0.40-4.50 .                Pregnancy Ranges           First trimester    0.26-2.66           Second trimester   0.55-2.73           Third trimester    0.43-2.91          Passed - Valid encounter within last 12 months    Recent Outpatient Visits          2 months ago Screening for colon cancer   [place], Coralie Keens, NP   5 months ago Encounter for general adult medical examination with abnormal findings   [facility] [place], Coralie Keens, NP   6 months ago Bacterial conjunctivitis of right eye   [facility] [place], Coralie Keens, NP   11 months ago Acquired hypothyroidism   [facility] [place], Coralie Keens, NP      Future Appointments            In 1 month [place], Coralie Keens, NP [facility], [place]           . glipiZIDE (GLUCOTROL) 10 MG tablet [Pharmacy Med Name: glipiZIDE 10 MG Oral Tablet] 180 tablet 0    Sig: TAKE 1 TABLET BY MOUTH TWICE DAILY BEFORE A MEAL     Endocrinology:  Diabetes - Sulfonylureas Passed - 04/25/2022 10:56 AM      Passed - HBA1C is between 0 and 7.9 and within 180 days    Hemoglobin A1C  Date Value Ref Range Status  02/23/2022 7.5 (A) 4.0 - 5.6 % Final   Hgb A1c MFr Bld  Date Value Ref Range Status  02/23/2022 7.7 (H) <5.7 % of total Hgb Final    Comment:    For someone without known diabetes, a hemoglobin A1c value of 6.5% or greater indicates that they may have  diabetes and this should be confirmed  with a follow-up  test. . For someone with known diabetes, a value <7% indicates  that their diabetes is well controlled and a value  greater than or equal to 7% indicates suboptimal  control. A1c targets should be individualized based on  duration of diabetes, age, comorbid conditions, and  other considerations. . Currently, no consensus exists regarding use of hemoglobin A1c for diagnosis of diabetes for children. .          Passed - Cr in normal range and within 360 days    Creat  Date Value Ref Range Status  02/23/2022 0.67 0.50 - 1.03 mg/dL Final   Creatinine, Urine  Date Value Ref Range Status  02/23/2022 213 20 - 275 mg/dL Final  Passed - Valid encounter within last 6 months    Recent Outpatient Visits          2 months ago Screening for colon cancer   [place], NP   5 months ago Encounter for general adult medical examination with abnormal findings   [facility] [place], Coralie Keens, NP   6 months ago Bacterial conjunctivitis of right eye   [facility] [place], Coralie Keens, NP   11 months ago Acquired hypothyroidism   [facility] [place], Coralie Keens, NP      Future Appointments            In 1 month [place], Coralie Keens, NP [facility], [facility]

## 2022-05-15 IMAGING — MG MM PLC BREAST LOC DEV 1ST LESION INC MAMMO GUIDE*L*
7 series · 7 of 7 positions shown · non-contrast
Comparison: Previous exam(s).

CLINICAL DATA: Patient with LEFT breast papilloma scheduled for
surgical excision requiring preoperative localization.

EXAM:
MAMMOGRAPHIC GUIDED RADIOACTIVE SEED LOCALIZATION OF THE LEFT BREAST

[L ML (1 of 3)]
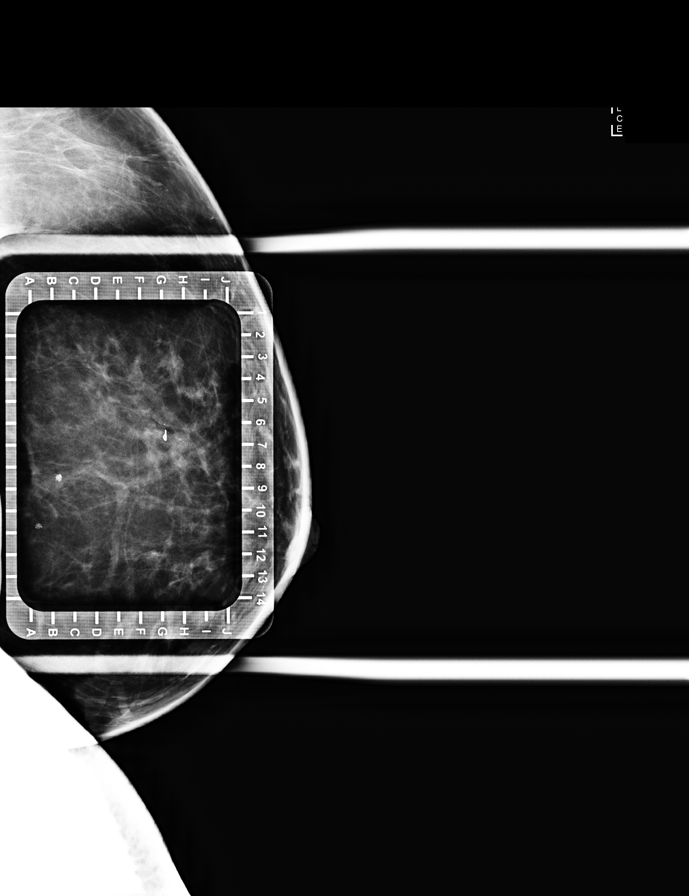

[L ML (2 of 3)]
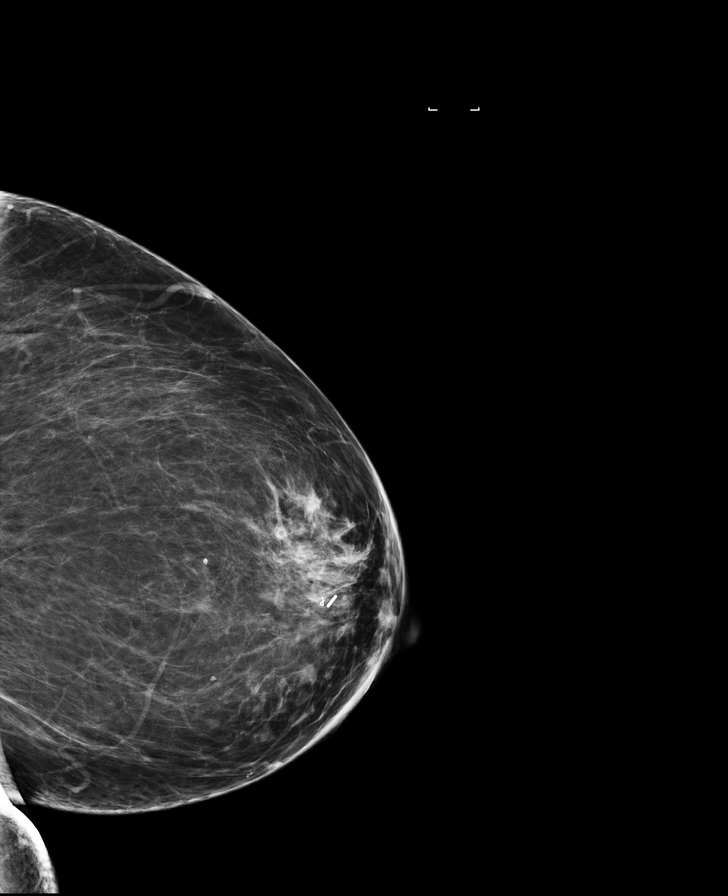

[L CC (1 of 4)]
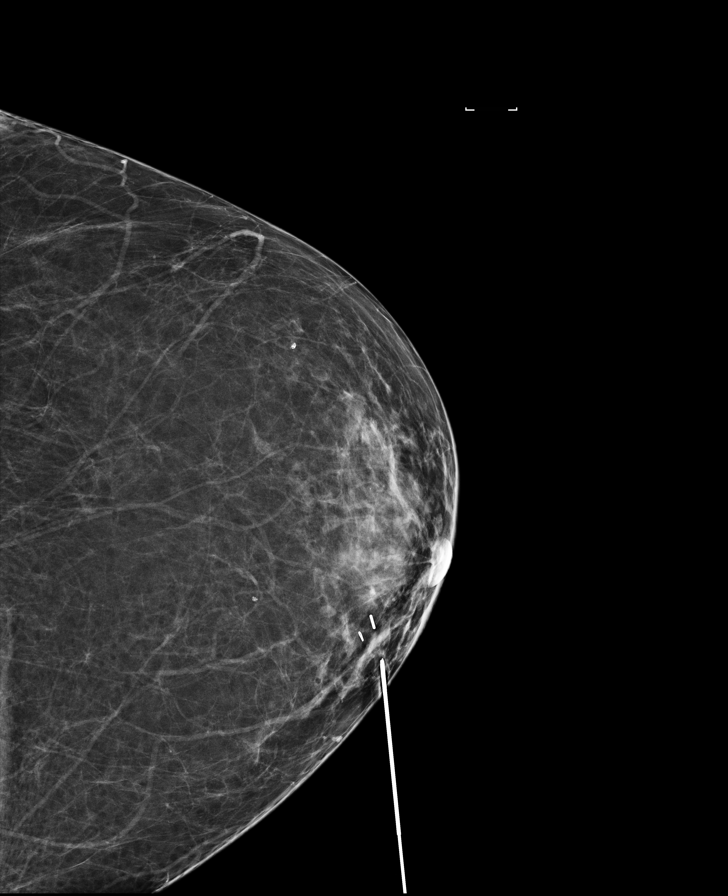

[L CC (2 of 4)]
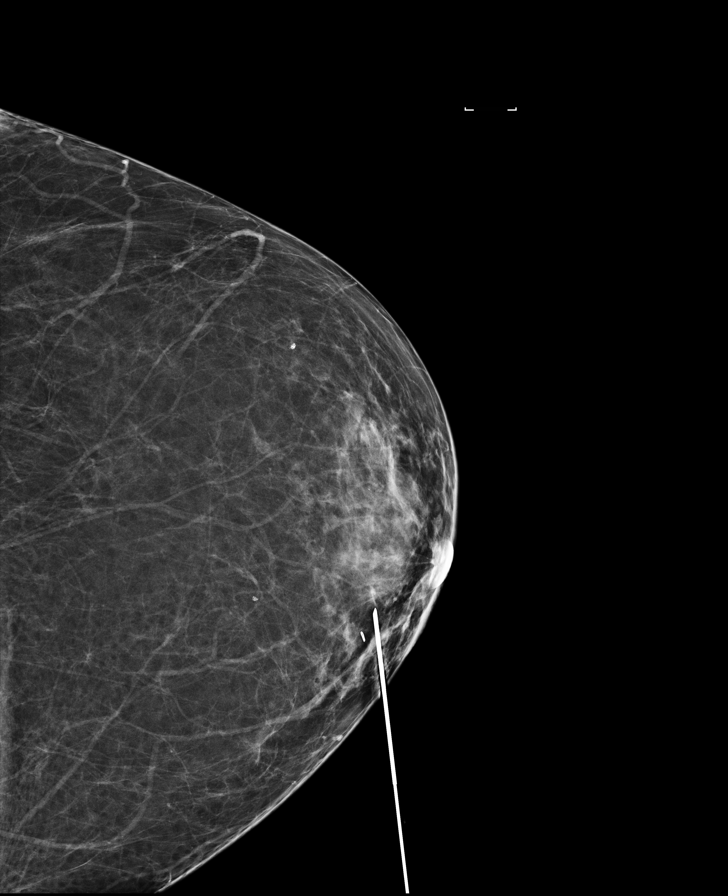

[L ML (3 of 3)]
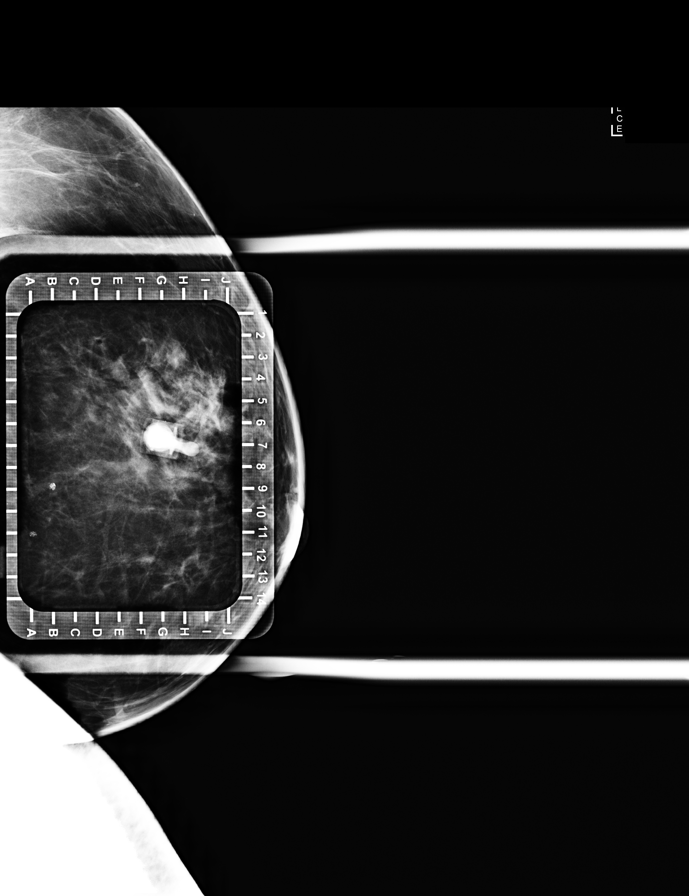

[L CC (3 of 4)]
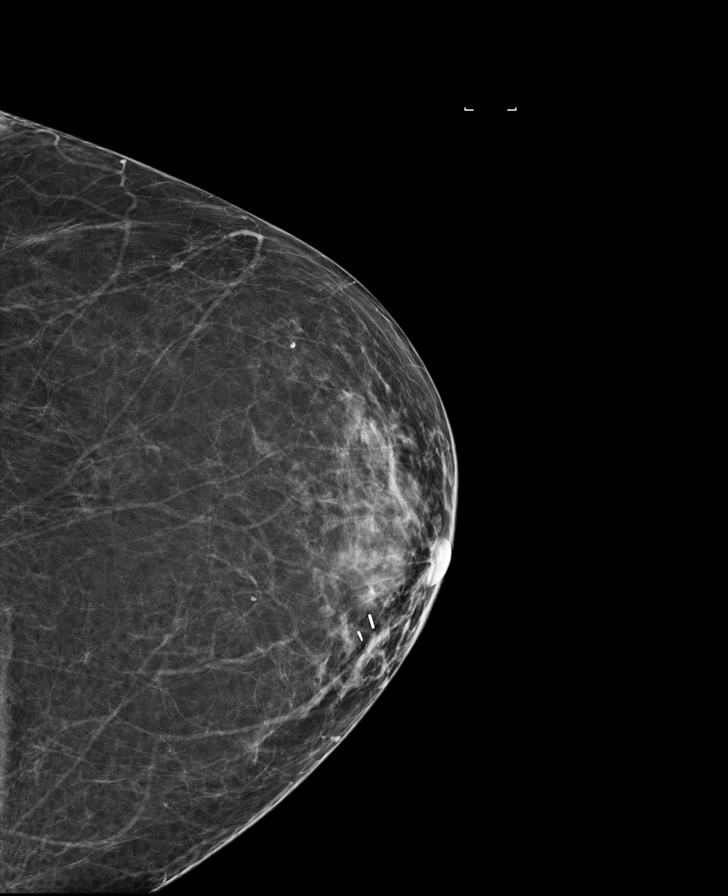

[L CC (4 of 4)]
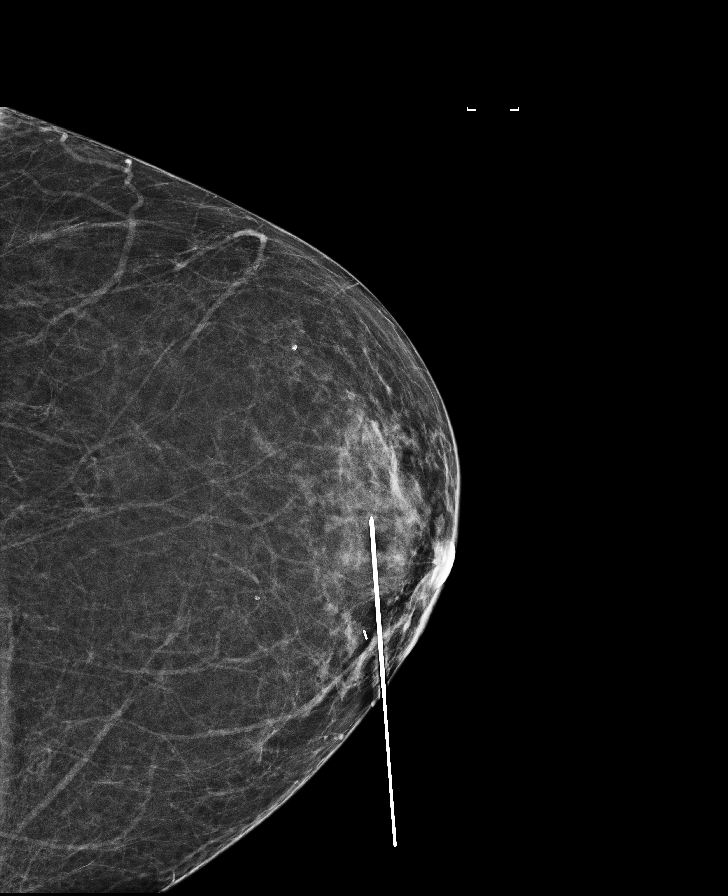

[7 of 7 positions shown; findings below may reference images not displayed]

FINDINGS: Patient presents for radioactive seed localization prior to surgical
excision. I met with the patient and we discussed the procedure of
seed localization including benefits and alternatives. We discussed
the high likelihood of a successful procedure. We discussed the
risks of the procedure including infection, bleeding, tissue injury
and further surgery. We discussed the low dose of radioactivity
involved in the procedure. Informed, written consent was given.

The usual time-out protocol was performed immediately prior to the
procedure.

Using mammographic guidance, sterile technique, 1% lidocaine and an
N-LZY radioactive seed, the biopsy clip within the LEFT breast was
localized using a medial approach. The follow-up mammogram images
confirm the seed in the expected location and were marked for Dr.
Keshawn.

Follow-up survey of the patient confirms presence of the radioactive
seed.

Order number of N-LZY seed:  [id number].

Total activity:  0.246 millicuries reference Date: 07/24/2019

The patient tolerated the procedure well and was released from the
[REDACTED]. She was given instructions regarding seed removal.
IMPRESSION: Radioactive seed localization left breast. No apparent
complications.

## 2022-05-27 ENCOUNTER — Ambulatory Visit: Payer: Self-pay | Admitting: Internal Medicine

## 2022-06-08 ENCOUNTER — Other Ambulatory Visit: Payer: Self-pay | Admitting: Internal Medicine

## 2022-06-09 NOTE — Telephone Encounter (Signed)
Requested Prescriptions  Pending Prescriptions Disp Refills  . celecoxib (CELEBREX) 50 MG capsule [Pharmacy Med Name: Celecoxib 50 MG Oral Capsule] 180 capsule 0    Sig: Take 1 capsule by mouth twice daily     Analgesics:  COX2 Inhibitors Failed - 06/08/2022 12:52 PM      Failed - Manual Review: Labs are only required if the patient has taken medication for more than 8 weeks.      Failed - HGB in normal range and within 360 days    Hemoglobin  Date Value Ref Range Status  02/23/2022 16.9 (H) 11.7 - 15.5 g/dL Final         Failed - HCT in normal range and within 360 days    HCT  Date Value Ref Range Status  02/23/2022 51.0 (H) 35.0 - 45.0 % Final         Passed - Cr in normal range and within 360 days    Creat  Date Value Ref Range Status  02/23/2022 0.67 0.50 - 1.03 mg/dL Final   Creatinine, Urine  Date Value Ref Range Status  02/23/2022 213 20 - 275 mg/dL Final         Passed - AST in normal range and within 360 days    AST  Date Value Ref Range Status  02/23/2022 13 10 - 35 U/L Final         Passed - ALT in normal range and within 360 days    ALT  Date Value Ref Range Status  02/23/2022 20 6 - 29 U/L Final         Passed - eGFR is 30 or above and within 360 days    GFR, Estimated  Date Value Ref Range Status  08/05/2020 >60 >60 mL/min Final   GFR  Date Value Ref Range Status  04/30/2020 83.89 >60.00 mL/min Final   eGFR  Date Value Ref Range Status  02/23/2022 104 > OR = 60 mL/min/1.33m Final    Comment:    The eGFR is based on the CKD-EPI 2021 equation. To calculate  the new eGFR from a previous Creatinine or Cystatin C result, go to https://www.kidney.org/professionals/ kdoqi/gfr%5Fcalculator          Passed - Patient is not pregnant      Passed - Valid encounter within last 12 months    Recent Outpatient Visits          3 months ago Screening for colon cancer   [place]   6 months ago Encounter for  general adult medical examination with abnormal findings   [facility]   8 months ago Bacterial conjunctivitis of right eye   [facility] [place]   1 year ago Acquired hypothyroidism   [facility] [place]             . sertraline (ZOLOFT) 50 MG tablet [Pharmacy Med Name: Sertraline HCl 50 MG Oral Tablet] 90 tablet 0    Sig: Take 1 tablet by mouth once daily     Psychiatry:  Antidepressants - SSRI - sertraline Passed - 06/08/2022 12:52 PM      Passed - AST in normal range and within 360 days    AST  Date Value Ref Range Status  02/23/2022 13 10 - 35 U/L Final         Passed - ALT in normal range and  within 360 days    ALT  Date Value Ref Range Status  02/23/2022 20 6 - 29 U/L Final         Passed - Completed PHQ-2 or PHQ-9 in the last 360 days      Passed - Valid encounter within last 6 months    Recent Outpatient Visits          3 months ago Screening for colon cancer   [place], NP   6 months ago Encounter for general adult medical examination with abnormal findings   [facility] [place], Coralie Keens, NP   8 months ago Bacterial conjunctivitis of right eye   [facility] [place], Coralie Keens, NP   1 year ago Acquired hypothyroidism   [facility] [place], Coralie Keens, NP

## 2022-07-13 ENCOUNTER — Other Ambulatory Visit: Payer: Self-pay | Admitting: Internal Medicine

## 2022-07-14 NOTE — Telephone Encounter (Signed)
Requested Prescriptions  Pending Prescriptions Disp Refills  . losartan (COZAAR) 50 MG tablet [Pharmacy Med Name: Losartan Potassium 50 MG Oral Tablet] 90 tablet 0    Sig: Take 1 tablet by mouth once daily     Cardiovascular:  Angiotensin Receptor Blockers Passed - 07/13/2022 12:23 PM      Passed - Cr in normal range and within 180 days    Creat  Date Value Ref Range Status  02/23/2022 0.67 0.50 - 1.03 mg/dL Final   Creatinine, Urine  Date Value Ref Range Status  02/23/2022 213 20 - 275 mg/dL Final         Passed - K in normal range and within 180 days    Potassium  Date Value Ref Range Status  02/23/2022 4.6 3.5 - 5.3 mmol/L Final         Passed - Patient is not pregnant      Passed - Last BP in normal range    BP Readings from Last 1 Encounters:  02/23/22 138/84         Passed - Valid encounter within last 6 months    Recent Outpatient Visits          4 months ago Screening for colon cancer   [place], Coralie Keens, NP   7 months ago Encounter for general adult medical examination with abnormal findings   [place], Coralie Keens, NP   9 months ago Bacterial conjunctivitis of right eye   [facility] [place], Coralie Keens, NP   1 year ago Acquired hypothyroidism   [facility] [place], Coralie Keens, NP

## 2022-07-24 ENCOUNTER — Other Ambulatory Visit: Payer: Self-pay | Admitting: Internal Medicine

## 2022-07-24 NOTE — Telephone Encounter (Signed)
Requested Prescriptions  Pending Prescriptions Disp Refills  . hydrOXYzine (VISTARIL) 25 MG capsule [Pharmacy Med Name: hydrOXYzine Pamoate 25 MG Oral Capsule] 270 capsule 0    Sig: TAKE 1 CAPSULE BY MOUTH EVERY 8 HOURS AS NEEDED     Ear, Nose, and Throat:  Antihistamines 2 Passed - 07/24/2022 11:22 AM      Passed - Cr in normal range and within 360 days    Creat  Date Value Ref Range Status  02/23/2022 0.67 0.50 - 1.03 mg/dL Final   Creatinine, Urine  Date Value Ref Range Status  02/23/2022 213 20 - 275 mg/dL Final         Passed - Valid encounter within last 12 months    Recent Outpatient Visits          5 months ago Screening for colon cancer   [facility] [place], Coralie Keens, NP   8 months ago Encounter for general adult medical examination with abnormal findings   [facility] [place], Coralie Keens, NP   9 months ago Bacterial conjunctivitis of right eye   [facility] [place], Coralie Keens, NP   1 year ago Acquired hypothyroidism   [facility] [address] [place], Coralie Keens, NP             . levothyroxine (SYNTHROID) 125 MCG tablet [Pharmacy Med Name: Levothyroxine Sodium 125 MCG Oral Tablet] 90 tablet 0    Sig: Take 1 tablet by mouth once daily     Endocrinology:  Hypothyroid Agents Passed - 07/24/2022 11:22 AM      Passed - TSH in normal range and within 360 days    TSH  Date Value Ref Range Status  02/23/2022 3.45 mIU/L Final    Comment:              Reference Range .           > or = 20 Years  0.40-4.50 .                Pregnancy Ranges           First trimester    0.26-2.66           Second trimester   0.55-2.73           Third trimester    0.43-2.91          Passed - Valid encounter within last 12 months    Recent Outpatient Visits          5 months ago Screening for colon cancer   [place], NP   8 months ago Encounter for general adult medical examination with abnormal findings    [facility] [place], Coralie Keens, NP   9 months ago Bacterial conjunctivitis of right eye   [facility] [place], Coralie Keens, NP   1 year ago Acquired hypothyroidism   [facility] [place], Coralie Keens, NP

## 2022-09-11 ENCOUNTER — Other Ambulatory Visit: Payer: Self-pay | Admitting: Internal Medicine

## 2022-09-14 NOTE — Telephone Encounter (Signed)
Patient needs OV, will refill medication until OV can be made. Patient needs OV for additional refills.  Requested Prescriptions  Pending Prescriptions Disp Refills   glipiZIDE (GLUCOTROL) 10 MG tablet [Pharmacy Med Name: glipiZIDE 10 MG Oral Tablet] 90 tablet 0    Sig: TAKE 1/2 (ONE-HALF) TABLET BY MOUTH TWICE DAILY BEFORE A MEAL     Endocrinology:  Diabetes - Sulfonylureas Failed - 09/11/2022  2:56 PM      Failed - HBA1C is between 0 and 7.9 and within 180 days    Hemoglobin A1C  Date Value Ref Range Status  02/23/2022 7.5 (A) 4.0 - 5.6 % Final   Hgb A1c MFr Bld  Date Value Ref Range Status  02/23/2022 7.7 (H) <5.7 % of total Hgb Final    Comment:    For someone without known diabetes, a hemoglobin A1c value of 6.5% or greater indicates that they may have  diabetes and this should be confirmed with a follow-up  test. . For someone with known diabetes, a value <7% indicates  that their diabetes is well controlled and a value  greater than or equal to 7% indicates suboptimal  control. A1c targets should be individualized based on  duration of diabetes, age, comorbid conditions, and  other considerations. . Currently, no consensus exists regarding use of hemoglobin A1c for diagnosis of diabetes for children. .          Failed - Valid encounter within last 6 months    Recent Outpatient Visits           6 months ago Screening for colon cancer   [facility] [place], Coralie Keens, NP   9 months ago Encounter for general adult medical examination with abnormal findings   [facility] [place], Coralie Keens, NP   11 months ago Bacterial conjunctivitis of right eye   [facility] [place], [place], NP   1 year ago Acquired hypothyroidism   [facility] [place], Regina W, NP              Passed - Cr in normal range and within 360 days    Creat  Date Value Ref Range Status  02/23/2022 0.67 0.50 - 1.03 mg/dL Final    Creatinine, Urine  Date Value Ref Range Status  02/23/2022 213 20 - 275 mg/dL Final          sertraline (ZOLOFT) 50 MG tablet [Pharmacy Med Name: Sertraline HCl 50 MG Oral Tablet] 90 tablet 0    Sig: Take 1 tablet by mouth once daily     Psychiatry:  Antidepressants - SSRI - sertraline Failed - 09/11/2022  2:56 PM      Failed - Valid encounter within last 6 months    Recent Outpatient Visits           6 months ago Screening for colon cancer   [place], Coralie Keens, NP   9 months ago Encounter for general adult medical examination with abnormal findings   [facility] [place], Coralie Keens, NP   11 months ago Bacterial conjunctivitis of right eye   [facility] [place], Coralie Keens, NP   1 year ago Acquired hypothyroidism   [facility] [place], [place] W, NP              Passed - AST in normal range and within 360 days    AST  Date Value Ref Range Status  02/23/2022 13 10 - 35 U/L Final         Passed - ALT in normal range and within 360 days    ALT  Date Value Ref Range Status  02/23/2022 20 6 - 29 U/L Final         Passed - Completed PHQ-2 or PHQ-9 in the last 360 days

## 2022-09-24 ENCOUNTER — Other Ambulatory Visit: Payer: Self-pay

## 2022-10-09 ENCOUNTER — Other Ambulatory Visit: Payer: Self-pay | Admitting: Internal Medicine

## 2022-10-09 NOTE — Telephone Encounter (Signed)
Requested Prescriptions  Pending Prescriptions Disp Refills   celecoxib (CELEBREX) 50 MG capsule [Pharmacy Med Name: Celecoxib 50 MG Oral Capsule] 180 capsule 0    Sig: Take 1 capsule by mouth twice daily     Analgesics:  COX2 Inhibitors Failed - 10/09/2022  1:16 PM      Failed - Manual Review: Labs are only required if the patient has taken medication for more than 8 weeks.      Failed - HGB in normal range and within 360 days    Hemoglobin  Date Value Ref Range Status  02/23/2022 16.9 (H) 11.7 - 15.5 g/dL Final         Failed - HCT in normal range and within 360 days    HCT  Date Value Ref Range Status  02/23/2022 51.0 (H) 35.0 - 45.0 % Final         Passed - Cr in normal range and within 360 days    Creat  Date Value Ref Range Status  02/23/2022 0.67 0.50 - 1.03 mg/dL Final   Creatinine, Urine  Date Value Ref Range Status  02/23/2022 213 20 - 275 mg/dL Final         Passed - AST in normal range and within 360 days    AST  Date Value Ref Range Status  02/23/2022 13 10 - 35 U/L Final         Passed - ALT in normal range and within 360 days    ALT  Date Value Ref Range Status  02/23/2022 20 6 - 29 U/L Final         Passed - eGFR is 30 or above and within 360 days    GFR, Estimated  Date Value Ref Range Status  08/05/2020 >60 >60 mL/min Final   GFR  Date Value Ref Range Status  04/30/2020 83.89 >60.00 mL/min Final   eGFR  Date Value Ref Range Status  02/23/2022 104 > OR = 60 mL/min/1.52m Final    Comment:    The eGFR is based on the CKD-EPI 2021 equation. To calculate  the new eGFR from a previous Creatinine or Cystatin C result, go to https://www.kidney.org/professionals/ kdoqi/gfr%5Fcalculator          Passed - Patient is not pregnant      Passed - Valid encounter within last 12 months    Recent Outpatient Visits           7 months ago [place] for colon cancer   [place] [place]   10 months ago Encounter for  general adult medical examination with abnormal findings   [facility]   1 year ago Bacterial conjunctivitis of right eye   [facility]   1 year ago [facility] hyp[facility]roidism   [facility] [place]

## 2022-10-15 ENCOUNTER — Telehealth: Payer: Self-pay | Admitting: Physician Assistant

## 2022-10-15 DIAGNOSIS — R6889 Other general symptoms and signs: Secondary | ICD-10-CM

## 2022-10-15 MED ORDER — ONDANSETRON 4 MG PO TBDP: 4.0000 mg | ORAL_TABLET | Freq: Three times a day (TID) | ORAL | 0 refills | Status: DC | PRN

## 2022-10-15 NOTE — Progress Notes (Signed)
E visit for Flu like symptoms   We are sorry that you are not feeling well.  Here is how we plan to help! Based on what you have shared with me it looks like you may have a respiratory virus that may be influenza. I would still highly recommend testing for COVID-19 to be cautious  Influenza or "the flu" is   an infection caused by a respiratory virus. The flu virus is highly contagious and persons who did not receive their yearly flu vaccination may "catch" the flu from close contact.  We have anti-viral medications to treat the viruses that cause this infection. They are not a "cure" and only shorten the course of the infection. These prescriptions are most effective when they are given within the first 2 days of "flu" symptoms. Antiviral medication are indicated if you have a high risk of complications from the flu. You should  also consider an antiviral medication if you are in close contact with someone who is at risk. These medications can help patients avoid complications from the flu  but have side effects that you should know. Possible side effects from Tamiflu or oseltamivir include nausea, vomiting, diarrhea, dizziness, headaches, eye redness, sleep problems or other respiratory symptoms. You should not take Tamiflu if you have an allergy to oseltamivir or any to the ingredients in Tamiflu.  Based upon your symptoms and potential risk factors I recommend that you follow the flu symptoms recommendation that I have listed below, as you are outside the window for an antiviral medication.  Please keep well-hydrated and try to get plenty of rest. If you have a humidifier, place it in the bedroom and run it at night. Start a saline nasal rinse for nasal congestion. You can consider use of a nasal steroid spray like Flonase or Nasacort OTC. You can alternate between Tylenol and Ibuprofen if needed for fever, body aches, headache and/or throat pain. Salt water-gargles and chloraseptic spray can be  very beneficial for sore throat. Mucinex-DM for congestion or cough. I have sent in a medication for nausea to take as directed.  Please take all prescribed medications as directed.  Remain out of work until [employer] for 24 hours without a fever-reducing medication, and you are feeling better.  You should mask until symptoms are resolved.  If anything worsens despite treatment, you need to be evaluated in-person. Please do not delay care.    ANYONE WHO HAS FLU SYMPTOMS SHOULD: Stay home. The flu is highly contagious and going out or to work exposes others! Be sure to drink plenty of fluids. Water is fine as well as fruit juices, sodas and electrolyte beverages. You may want to stay away from caffeine or alcohol. If you are nauseated, try taking small sips of liquids. How do you know if you are getting enough fluid? Your urine should be a pale yellow or almost colorless. Get rest. Taking a steamy shower or using a humidifier may help nasal congestion and ease sore throat pain. Using a saline nasal spray works much the same way. Cough drops, hard candies and sore throat lozenges may ease your cough. Line up a caregiver. Have someone check on you regularly.   GET HELP RIGHT AWAY IF: You cannot keep down liquids or your medications. You become short of breath Your fell like you are going to pass out or loose consciousness. Your symptoms persist after you have completed your treatment plan MAKE SURE YOU  Understand these instructions. Will watch your condition. Will  get help right away if you are not doing well or get worse.  Your e-visit answers were reviewed by a board certified advanced clinical practitioner to complete your personal care plan.  Depending on the condition, your plan could have included both over the counter or prescription medications.  If there is a problem please reply  once you have received a response from your provider.  Your safety is important to [place].  If you  have drug allergies check your prescription carefully.    You can use MyChart to ask questions about today's visit, request a non-urgent call back, or ask for a work or school excuse for 24 hours related to this e-Visit. If it has been greater than 24 hours you will need to follow up with your provider, or enter a new e-Visit to address those concerns.  You will get an e-mail in the next two days asking about your experience.  I hope that your e-visit has been valuable and will speed your recovery. Thank you for using e-visits.

## 2022-10-15 NOTE — Progress Notes (Signed)
I have spent 5 minutes in review of e-visit questionnaire, review and updating patient chart, medical decision making and response to patient.   Sukhraj Esquivias Cody Mickayla Trouten, PA-C    

## 2022-10-27 ENCOUNTER — Other Ambulatory Visit: Payer: Self-pay | Admitting: Internal Medicine

## 2022-10-27 NOTE — Telephone Encounter (Signed)
Requested medication (s) are due for refill today: yes  Requested medication (s) are on the active medication list: yes  Last refill:  Levothyroxine 07/24/22, glipizide 09/14/22 Losartan 07/14/22  Future visit scheduled: no  Notes to clinic:  pt is due for updated labs and appt, pt called, LVMTCB      Requested Prescriptions  Pending Prescriptions Disp Refills   levothyroxine (SYNTHROID) 125 MCG tablet [Pharmacy Med Name: Levothyroxine Sodium 125 MCG Oral Tablet] 90 tablet 0    Sig: Take 1 tablet by mouth once daily     Endocrinology:  Hypothyroid Agents Passed - 10/27/2022  3:12 PM      Passed - TSH in normal range and within 360 days    TSH  Date Value Ref Range Status  02/23/2022 3.45 mIU/L Final    Comment:              Reference Range .           > or = 20 Years  0.40-4.50 .                Pregnancy Ranges           First trimester    0.26-2.66           Second trimester   0.55-2.73           Third trimester    0.43-2.91          Passed - Valid encounter within last 12 months    Recent Outpatient Visits           8 months ago Screening for colon cancer   [facility] [place], Coralie Keens, NP   11 months ago Encounter for general adult medical examination with abnormal findings   [facility] [place], Coralie Keens, NP   1 year ago Bacterial conjunctivitis of right eye   [facility] [place], Coralie Keens, NP   1 year ago Acquired hypothyroidism   [facility] [place], Coralie Keens, NP               glipiZIDE (GLUCOTROL) 10 MG tablet [Pharmacy Med Name: glipiZIDE 10 MG Oral Tablet] 90 tablet 0    Sig: TAKE 1/2 (ONE-HALF) TABLET BY MOUTH TWICE DAILY BEFORE A MEAL     Endocrinology:  Diabetes - Sulfonylureas Failed - 10/27/2022  3:12 PM      Failed - HBA1C is between 0 and 7.9 and within 180 days    Hemoglobin A1C  Date Value Ref Range Status  02/23/2022 7.5 (A) 4.0 - 5.6 % Final   Hgb A1c MFr Bld  Date Value Ref Range  Status  02/23/2022 7.7 (H) <5.7 % of total Hgb Final    Comment:    For someone without known diabetes, a hemoglobin A1c value of 6.5% or greater indicates that they may have  diabetes and this should be confirmed with a follow-up  test. . For someone with known diabetes, a value <7% indicates  that their diabetes is well controlled and a value  greater than or equal to 7% indicates suboptimal  control. A1c targets should be individualized based on  duration of diabetes, age, comorbid conditions, and  other considerations. . Currently, no consensus exists regarding use of hemoglobin A1c for diagnosis of diabetes for children. .          Failed - Valid encounter within last 6 months    Recent Outpatient Visits  8 months ago Screening for colon cancer   [facility] [place], Coralie Keens, NP   11 months ago Encounter for general adult medical examination with abnormal findings   [facility] [place], [facility], NP   1 year ago Bacterial conjunctivitis of right eye   [facility] [place], Coralie Keens, NP   1 year ago Acquired hypothyroidism   [facility] [place], [place]              Passed - Cr in normal range and within 360 days    Creat  Date Value Ref Range Status  02/23/2022 0.67 0.50 - 1.03 mg/dL Final   Creatinine, Urine  Date Value Ref Range Status  02/23/2022 213 20 - 275 mg/dL Final          losartan (COZAAR) 50 MG tablet [Pharmacy Med Name: Losartan Potassium 50 MG Oral Tablet] 90 tablet 0    Sig: TAKE 1 TABLET BY MOUTH ONCE DAILY . APPOINTMENT REQUIRED FOR FUTURE REFILLS     Cardiovascular:  Angiotensin Receptor Blockers Failed - 10/27/2022  3:12 PM      Failed - Cr in normal range and within 180 days    Creat  Date Value Ref Range Status  02/23/2022 0.67 0.50 - 1.03 mg/dL Final   Creatinine, Urine  Date Value Ref Range Status  02/23/2022 213 20 - 275 mg/dL Final         Failed - K in  normal range and within 180 days    Potassium  Date Value Ref Range Status  02/23/2022 4.6 3.5 - 5.3 mmol/L Final         Failed - Valid encounter within last 6 months    Recent Outpatient Visits           8 months ago Screening for colon cancer   [facility] [place], NP   11 months ago Encounter for general adult medical examination with abnormal findings   [facility] [place], Coralie Keens, NP   1 year ago Bacterial conjunctivitis of right eye   [facility] [place], Coralie Keens, NP   1 year ago Acquired hypothyroidism   [place], [place]              Passed - Patient is not pregnant      Passed - Last BP in normal range    BP Readings from Last 1 Encounters:  02/23/22 138/84

## 2022-10-27 NOTE — Telephone Encounter (Signed)
Patient called, left VM to return the call to the office to scheduled an appt for medication refill request.   

## 2022-10-29 ENCOUNTER — Other Ambulatory Visit: Payer: Self-pay | Admitting: Internal Medicine

## 2022-10-30 ENCOUNTER — Other Ambulatory Visit: Payer: Self-pay | Admitting: Internal Medicine

## 2022-10-30 MED ORDER — LOSARTAN POTASSIUM 50 MG PO TABS: 50.0000 mg | ORAL_TABLET | Freq: Every day | ORAL | 0 refills | Status: DC

## 2022-10-30 NOTE — Telephone Encounter (Signed)
Medication Refill - Medication: losartan (COZAAR) 50 MG tablet   Has the patient contacted their pharmacy? Yes.     Preferred Pharmacy (with phone number or street name):  [facility], [address] Phone: [phone]  Fax: [phone]     Has the patient been seen for an appointment in the last year OR does the patient have an upcoming appointment? Yes.    Agent: Please be advised that RX refills may take up to 3 business days. We ask that you follow-up with your pharmacy.

## 2022-10-30 NOTE — Telephone Encounter (Signed)
Unable to refill per protocol, Rx request is a duplicate. Request was refused, patient needs OV. Will refuse.  Requested Prescriptions  Pending Prescriptions Disp Refills   levothyroxine (SYNTHROID) 125 MCG tablet [Pharmacy Med Name: Levothyroxine Sodium 125 MCG Oral Tablet] 90 tablet 0    Sig: Take 1 tablet by mouth once daily     Endocrinology:  Hypothyroid Agents Passed - 10/29/2022  5:57 PM      Passed - TSH in normal range and within 360 days    TSH  Date Value Ref Range Status  02/23/2022 3.45 mIU/L Final    Comment:              Reference Range .           > or = 20 Years  0.40-4.50 .                Pregnancy Ranges           First trimester    0.26-2.66           Second trimester   0.55-2.73           Third trimester    0.43-2.91          Passed - Valid encounter within last 12 months    Recent Outpatient Visits           8 months ago Screening for colon cancer   [place], NP   11 months ago Encounter for general adult medical examination with abnormal findings   [facility] [place], Coralie Keens, NP   1 year ago Bacterial conjunctivitis of right eye   [facility] [place], Coralie Keens, NP   1 year ago Acquired hypothyroidism   [place], Coralie Keens, NP               losartan (COZAAR) 50 MG tablet [Pharmacy Med Name: Losartan Potassium 50 MG Oral Tablet] 90 tablet 0    Sig: TAKE 1 TABLET BY MOUTH ONCE DAILY . APPOINTMENT REQUIRED FOR FUTURE REFILLS     Cardiovascular:  Angiotensin Receptor Blockers Failed - 10/29/2022  5:57 PM      Failed - Cr in normal range and within 180 days    Creat  Date Value Ref Range Status  02/23/2022 0.67 0.50 - 1.03 mg/dL Final   Creatinine, Urine  Date Value Ref Range Status  02/23/2022 213 20 - 275 mg/dL Final         Failed - K in normal range and within 180 days    Potassium  Date Value Ref Range Status  02/23/2022 4.6 3.5 - 5.3 mmol/L Final          Failed - Valid encounter within last 6 months    Recent Outpatient Visits           8 months ago Screening for colon cancer   [facility] J[place] Fenton, NP   11 months ago Encounter for general adult medical examination with abnormal findings   [facility] [place], Coralie Keens, NP   1 year ago Bacterial conjunctivitis of right eye   [facility] [place], Coralie Keens, NP   1 year ago Acquired hypothyroidism   [place], [place]              Passed - Patient is not pregnant      Passed -  Last BP in normal range    BP Readings from Last 1 Encounters:  02/23/22 138/84          glipiZIDE (GLUCOTROL) 10 MG tablet [Pharmacy Med Name: glipiZIDE 10 MG Oral Tablet] 90 tablet 0    Sig: TAKE 1/2 (ONE-HALF) TABLET BY MOUTH TWICE DAILY BEFORE A MEAL     Endocrinology:  Diabetes - Sulfonylureas Failed - 10/29/2022  5:57 PM      Failed - HBA1C is between 0 and 7.9 and within 180 days    Hemoglobin A1C  Date Value Ref Range Status  02/23/2022 7.5 (A) 4.0 - 5.6 % Final   Hgb A1c MFr Bld  Date Value Ref Range Status  02/23/2022 7.7 (H) <5.7 % of total Hgb Final    Comment:    For someone without known diabetes, a hemoglobin A1c value of 6.5% or greater indicates that they may have  diabetes and this should be confirmed with a follow-up  test. . For someone with known diabetes, a value <7% indicates  that their diabetes is well controlled and a value  greater than or equal to 7% indicates suboptimal  control. A1c targets should be individualized based on  duration of diabetes, age, comorbid conditions, and  other considerations. . Currently, no consensus exists regarding use of hemoglobin A1c for diagnosis of diabetes for children. .          Failed - Valid encounter within last 6 months    Recent Outpatient Visits           8 months ago Screening for colon cancer   [facility]  [place], Coralie Keens, NP   11 months ago Encounter for general adult medical examination with abnormal findings   [facility] [place], Coralie Keens, NP   1 year ago Bacterial conjunctivitis of right eye   [facility] [place], Coralie Keens, NP   1 year ago Acquired hypothyroidism   [facility] [place], [place] W, NP              Passed - Cr in normal range and within 360 days    Creat  Date Value Ref Range Status  02/23/2022 0.67 0.50 - 1.03 mg/dL Final   Creatinine, Urine  Date Value Ref Range Status  02/23/2022 213 20 - 275 mg/dL Final

## 2022-10-30 NOTE — Telephone Encounter (Signed)
Requested medication (s) are due for refill today: yes  Requested medication (s) are on the active medication list: yes  Last refill:  07/14/22 #90/0  Future visit scheduled: yes 11/03/22  Notes to clinic:  Unable to refill per protocol due to failed labs, no updated results, pt asking for refill until appt.      Requested Prescriptions  Pending Prescriptions Disp Refills   losartan (COZAAR) 50 MG tablet 90 tablet 0    Sig: Take 1 tablet (50 mg total) by mouth daily.     Cardiovascular:  Angiotensin Receptor Blockers Failed - 10/30/2022  8:56 AM      Failed - Cr in normal range and within 180 days    Creat  Date Value Ref Range Status  02/23/2022 0.67 0.50 - 1.03 mg/dL Final   Creatinine, Urine  Date Value Ref Range Status  02/23/2022 213 20 - 275 mg/dL Final         Failed - K in normal range and within 180 days    Potassium  Date Value Ref Range Status  02/23/2022 4.6 3.5 - 5.3 mmol/L Final         Failed - Valid encounter within last 6 months    Recent Outpatient Visits           8 months ago Screening for colon cancer   [place], Coralie Keens, NP   11 months ago Encounter for general adult medical examination with abnormal findings   [facility] [place], Coralie Keens, NP   1 year ago Bacterial conjunctivitis of right eye   [facility] [place], Coralie Keens, NP   1 year ago Acquired hypothyroidism   [place], NP       Future Appointments             In 4 days [place], Coralie Keens, NP [facility] [place] - Patient is not pregnant      Passed - Last BP in normal range    BP Readings from Last 1 Encounters:  02/23/22 138/84

## 2022-11-03 ENCOUNTER — Encounter: Payer: Self-pay | Admitting: Internal Medicine

## 2022-11-03 ENCOUNTER — Telehealth: Payer: Self-pay | Admitting: Internal Medicine

## 2022-11-03 ENCOUNTER — Ambulatory Visit (INDEPENDENT_AMBULATORY_CARE_PROVIDER_SITE_OTHER): Payer: 59 | Admitting: Internal Medicine

## 2022-11-03 ENCOUNTER — Other Ambulatory Visit: Payer: Self-pay

## 2022-11-03 VITALS — BP 142/92 | HR 77 | Temp 97.1°F | Wt 321.0 lb

## 2022-11-03 DIAGNOSIS — F419 Anxiety disorder, unspecified: Secondary | ICD-10-CM

## 2022-11-03 DIAGNOSIS — M150 Primary generalized (osteo)arthritis: Secondary | ICD-10-CM

## 2022-11-03 DIAGNOSIS — D751 Secondary polycythemia: Secondary | ICD-10-CM

## 2022-11-03 DIAGNOSIS — E039 Hypothyroidism, unspecified: Secondary | ICD-10-CM

## 2022-11-03 DIAGNOSIS — J432 Centrilobular emphysema: Secondary | ICD-10-CM | POA: Diagnosis not present

## 2022-11-03 DIAGNOSIS — Z6841 Body Mass Index (BMI) 40.0 and over, adult: Secondary | ICD-10-CM

## 2022-11-03 DIAGNOSIS — M159 Polyosteoarthritis, unspecified: Secondary | ICD-10-CM

## 2022-11-03 DIAGNOSIS — E119 Type 2 diabetes mellitus without complications: Secondary | ICD-10-CM

## 2022-11-03 DIAGNOSIS — F32A Depression, unspecified: Secondary | ICD-10-CM

## 2022-11-03 DIAGNOSIS — I10 Essential (primary) hypertension: Secondary | ICD-10-CM

## 2022-11-03 DIAGNOSIS — F5101 Primary insomnia: Secondary | ICD-10-CM

## 2022-11-03 LAB — POCT GLYCOSYLATED HEMOGLOBIN (HGB A1C): Hemoglobin A1C: 10.6 % — AB (ref 4.0–5.6)

## 2022-11-03 MED ORDER — TIRZEPATIDE 2.5 MG/0.5ML ~~LOC~~ SOAJ: 2.5000 mg | SUBCUTANEOUS | 0 refills | Status: DC

## 2022-11-03 MED ORDER — GLIPIZIDE 10 MG PO TABS: 10.0000 mg | ORAL_TABLET | Freq: Two times a day (BID) | ORAL | 0 refills | Status: DC

## 2022-11-03 MED ORDER — LEVOTHYROXINE SODIUM 125 MCG PO TABS: 125.0000 ug | ORAL_TABLET | Freq: Every day | ORAL | 0 refills | Status: DC

## 2022-11-03 MED ORDER — LOSARTAN POTASSIUM 100 MG PO TABS: 100.0000 mg | ORAL_TABLET | Freq: Every day | ORAL | 0 refills | Status: DC

## 2022-11-03 MED ORDER — HYDROXYZINE PAMOATE 25 MG PO CAPS: 25.0000 mg | ORAL_CAPSULE | Freq: Three times a day (TID) | ORAL | 0 refills | Status: DC | PRN

## 2022-11-03 NOTE — Assessment & Plan Note (Signed)
CBC today.  

## 2022-11-03 NOTE — Assessment & Plan Note (Signed)
Continue sertraline and hydroxyzine Support offered

## 2022-11-03 NOTE — Assessment & Plan Note (Signed)
Continue hydroxyzine as needed 

## 2022-11-03 NOTE — Assessment & Plan Note (Signed)
Encourage diet and exercise for weight loss We will trial Mounjaro 

## 2022-11-03 NOTE — Assessment & Plan Note (Signed)
Encourage weight loss as this can help reduce joint pain Continue Celebrex

## 2022-11-03 NOTE — Progress Notes (Addendum)
Subjective:    Patient ID: Donna Whitaker, female    DOB: January 13, 1971, 52 y.o.   MRN: [id number]  HPI  Patient presents to clinic today for follow-up of chronic conditions.  Hypothyroidism: She denies any issues on her current dose of Levothyroxine.  She does not follow with endocrinology.  Insomnia: She has difficulty staying asleep.  She takes Hydroxyzine as needed with minimal relief of symptoms.  There is no sleep study on file.  Anxiety and Depression: Chronic, managed on Sertraline and Hydroxyzine.  She is not currently seeing a therapist.  She denies SI/HI.  OA: Mainly in her back and knees.  She is taking Celebrex as prescribed.  She does not follow with orthopedics.  COPD: She reports chronic cough but denies shortness of breath.  She uses Albuterol as needed.  There are no PFTs on file.  She does smoke.  HTN: Her BP today is 144/88.  She is taking Losartan as prescribed..  ECG from 07/2020 reviewed.  DM2: Her last A1c was 7.7%, 02/2022.  She is taking Glipizide as prescribed.  She has failed Metformin in the past secondary to GI side effects.  She does not check her sugars.  She does not check her feet routinely.  Her last eye exam was >1-year ago.  Flu never.  Pneumovax never.  COVID never.  HLD: Her last LDL was 117, triglycerides 152, 02/2022.  She is not taking any cholesterol-lowering medication at this time.  She does not consume a low-fat diet.  Polycythemia: Her last H/H was 16.9/51, 02/2022.  She does continue to smoke.  She does not follow with hematology.   Review of Systems     Past Medical History:  Diagnosis Date   Allergy    Anxiety    Arthritis    knees, back   COPD (chronic obstructive pulmonary disease) (HCC)    uses albuterol as needed   Depression    Diabetes mellitus without complication (HCC)    Heavy smoker    2ppd   History of chicken pox    Hypertension    Hypothyroidism    Intraductal papilloma of breast, left     Current Outpatient  Medications  Medication Sig Dispense Refill   celecoxib (CELEBREX) 50 MG capsule Take 1 capsule by mouth twice daily 180 capsule 0   Cholecalciferol (EQL VITAMIN D3) 50 MCG (2000 UT) CAPS Take by mouth.     fexofenadine (ALLEGRA) 180 MG tablet Take 180 mg by mouth daily.     furosemide (LASIX) 20 MG tablet TAKE 1 TABLET BY MOUTH ONCE DAILY . APPOINTMENT REQUIRED FOR FUTURE REFILLS (Patient not taking: Reported on 02/23/2022) 90 tablet 0   glipiZIDE (GLUCOTROL) 10 MG tablet TAKE 1/2 (ONE-HALF) TABLET BY MOUTH TWICE DAILY BEFORE A MEAL 90 tablet 0   hydrOXYzine (VISTARIL) 25 MG capsule TAKE 1 CAPSULE BY MOUTH EVERY 8 HOURS AS NEEDED 270 capsule 0   ibuprofen (ADVIL,MOTRIN) 200 MG tablet Take 800 mg by mouth daily as needed.     Lancets (ONETOUCH ULTRASOFT) lancets 1 each by Other route in the morning and at bedtime. 200 each 1   levothyroxine (SYNTHROID) 125 MCG tablet Take 1 tablet by mouth once daily 90 tablet 0   losartan (COZAAR) 50 MG tablet Take 1 tablet (50 mg total) by mouth daily. 90 tablet 0   olopatadine (PATADAY) 0.1 % ophthalmic solution Place 1 drop into the left eye daily. 5 mL 0   ondansetron (ZOFRAN-ODT) 4 MG disintegrating tablet  Take 1 tablet (4 mg total) by mouth every 8 (eight) hours as needed for nausea or vomiting. 20 tablet 0   ONETOUCH ULTRA test strip 1 EACH BY OTHER ROUTE IN THE MORNING AND AT BEDTIME. 200 strip 1   sertraline (ZOLOFT) 50 MG tablet Take 1 tablet by mouth once daily 90 tablet 0   simvastatin (ZOCOR) 10 MG tablet Take 1 tablet (10 mg total) by mouth at bedtime. (Patient not taking: Reported on 02/23/2022) 90 tablet 0   trimethoprim-polymyxin b (POLYTRIM) ophthalmic solution Place 1 drop into the right eye every 4 (four) hours. 10 mL 0   No current facility-administered medications for this visit.    Allergies  Allergen Reactions   Betadine [Povidone Iodine] Other (See Comments)    Skin irritation   Vicodin [Hydrocodone-Acetaminophen] Palpitations     Chest pain   Hydrocodone-Acetaminophen    Iodine     Family History  Problem Relation Age of Onset   Arthritis Mother    Hyperlipidemia Mother    Alcohol abuse Father    Hyperlipidemia Father    Heart disease Father    Hypertension Father    Arthritis Maternal Grandmother    Hyperlipidemia Maternal Grandmother    Heart disease Maternal Grandmother    Hypertension Maternal Grandmother    Diabetes Maternal Grandmother    Hyperlipidemia Maternal Grandfather    Hypertension Maternal Grandfather    Hyperlipidemia Paternal Grandmother    Hypertension Paternal Grandmother    Stomach cancer Paternal Grandmother    Alcohol abuse Paternal Grandfather    Hyperlipidemia Paternal Grandfather    Heart disease Paternal Grandfather    Hypertension Paternal Grandfather     Social History   Socioeconomic History   Marital status: Married    Spouse name: Not on file   Number of children: Not on file   Years of education: Not on file   Highest education level: Not on file  Occupational History   Not on file  Tobacco Use   Smoking status: Every Day    Packs/day: 2.00    Years: 35.00    Total pack years: 70.00    Types: Cigarettes   Smokeless tobacco: Never  Vaping Use   Vaping Use: Never used  Substance and Sexual Activity   Alcohol use: Yes    Comment: occasional   Drug use: No   Sexual activity: Yes    Birth control/protection: Post-menopausal, Surgical    Comment: BTL  Other Topics Concern   Not on file  Social History Narrative   ** Merged History Encounter **       Social Determinants of Health   Financial Resource Strain: Not on file  Food Insecurity: Not on file  Transportation Needs: Not on file  Physical Activity: Not on file  Stress: Not on file  Social Connections: Not on file  Intimate Partner Violence: Not on file     Constitutional: Denies fever, malaise, fatigue, headache or abrupt weight changes.  HEENT: Patient reports blurred vision.  Denies eye  pain, eye redness, ear pain, ringing in the ears, wax buildup, runny nose, nasal congestion, bloody nose, or sore throat. Respiratory: Patient reports chronic cough.  Denies difficulty breathing, shortness of breath, or sputum production.   Cardiovascular: Denies chest pain, chest tightness, palpitations or swelling in the hands or feet.  Gastrointestinal: Denies abdominal pain, bloating, constipation, diarrhea or blood in the stool.  GU: Denies urgency, frequency, pain with urination, burning sensation, blood in urine, odor or discharge. Musculoskeletal: Patient  reports joint pain.  Denies decrease in range of motion, difficulty with gait, muscle pain or joint swelling.  Skin: Denies redness, rashes, lesions or ulcercations.  Neurological: Patient reports insomnia.  Denies dizziness, difficulty with memory, difficulty with speech or problems with balance and coordination.  Psych: Patient has a history of anxiety and depression.  Denies SI/HI.  No other specific complaints in a complete review of systems (except as listed in HPI above).  Objective:   Physical Exam   BP (!) 142/92 (BP Location: Left Arm, Patient Position: Sitting, Cuff Size: Large)   Pulse 77   Temp (!) 97.1 F (36.2 C) (Temporal)   Wt (!) 321 lb (145.6 kg)   LMP 05/19/2016 Comment: BTL  SpO2 94%   BMI 48.81 kg/m   Wt Readings from Last 3 Encounters:  02/23/22 (!) 312 lb (141.5 kg)  11/24/21 (!) 316 lb (143.3 kg)  05/06/21 (!) 310 lb 12.8 oz (141 kg)    General: Appears her stated age, obese, in NAD. Skin: Warm, dry and intact. No ulcerations noted. HEENT: Head: normal shape and size; Eyes: sclera white, no icterus, conjunctiva pink, PERRLA and EOMs intact;  Cardiovascular: Normal rate and rhythm. S1,S2 noted.  No murmur, rubs or gallops noted. No JVD or BLE edema. No carotid bruits noted. Pulmonary/Chest: Normal effort and positive vesicular breath sounds. No respiratory distress. No wheezes, rales or ronchi  noted.  Abdomen: Soft and nontender. Normal bowel sounds.  Musculoskeletal:  No difficulty with gait.  Neurological: Alert and oriented. Coordination normal.  Psychiatric: Mood and affect normal. Behavior is normal. Judgment and thought content normal.     BMET    Component Value Date/Time   NA 138 02/23/2022 1115   K 4.6 02/23/2022 1115   CL 101 02/23/2022 1115   CO2 27 02/23/2022 1115   GLUCOSE 194 (H) 02/23/2022 1115   BUN 12 02/23/2022 1115   CREATININE 0.67 02/23/2022 1115   CALCIUM 9.5 02/23/2022 1115   GFRNONAA >60 08/05/2020 0820    Lipid Panel     Component Value Date/Time   CHOL 193 02/23/2022 1115   TRIG 152 (H) 02/23/2022 1115   HDL 49 (L) 02/23/2022 1115   CHOLHDL 3.9 02/23/2022 1115   VLDL 20.6 04/30/2020 0921   LDLCALC 117 (H) 02/23/2022 1115    CBC    Component Value Date/Time   WBC 10.7 02/23/2022 1115   RBC 5.56 (H) 02/23/2022 1115   HGB 16.9 (H) 02/23/2022 1115   HCT 51.0 (H) 02/23/2022 1115   PLT 289 02/23/2022 1115   MCV 91.7 02/23/2022 1115   MCH 30.4 02/23/2022 1115   MCHC 33.1 02/23/2022 1115   RDW 12.0 02/23/2022 1115    Hgb A1C Lab Results  Component Value Date   HGBA1C 7.7 (H) 02/23/2022           Assessment & Plan:   RTC in 3 months for annual exam Webb Silvers[place], NP

## 2022-11-03 NOTE — Assessment & Plan Note (Signed)
POCT A1c 10.6% We will check urine microalbumin Continue glipizide We will add Mounjaro 2.5 mg daily Reinforced low-carb diet and exercise for weight loss Encouraged her to schedule appointment for her eye exam Encouraged routine foot exams She declines immunizations

## 2022-11-03 NOTE — Assessment & Plan Note (Signed)
TSH and free T4 today We will adjust levothyroxine if needed based on labs 

## 2022-11-03 NOTE — Assessment & Plan Note (Signed)
Uncontrolled on losartan Will increase to 100 mg daily Reinforced DASH diet and exercise for weight loss C-Met today

## 2022-11-03 NOTE — Telephone Encounter (Signed)
Pts spouse came back to the window to ask about the pts Rx's and they were wanting to know if these two Rx's are going to be sent to the pharmacy  hydroxyzine '25MG'$  and Levothyroxine 125 MG.  Pharm:  [place], [place]

## 2022-11-03 NOTE — Patient Instructions (Signed)

## 2022-11-03 NOTE — Assessment & Plan Note (Signed)
Encourage smoking cessation Continue albuterol as needed

## 2022-11-04 ENCOUNTER — Other Ambulatory Visit: Payer: Self-pay | Admitting: Internal Medicine

## 2022-11-04 ENCOUNTER — Encounter: Payer: Self-pay | Admitting: Internal Medicine

## 2022-11-04 LAB — COMPLETE METABOLIC PANEL WITH GFR
AG Ratio: 1.4 (calc) (ref 1.0–2.5)
ALT: 21 U/L (ref 6–29)
AST: 13 U/L (ref 10–35)
Albumin: 3.7 g/dL (ref 3.6–5.1)
Alkaline phosphatase (APISO): 102 U/L (ref 37–153)
BUN: 9 mg/dL (ref 7–25)
CO2: 33 mmol/L — ABNORMAL HIGH (ref 20–32)
Calcium: 8.9 mg/dL (ref 8.6–10.4)
Chloride: 98 mmol/L (ref 98–110)
Creat: 0.73 mg/dL (ref 0.50–1.03)
Globulin: 2.7 g/dL (calc) (ref 1.9–3.7)
Glucose, Bld: 203 mg/dL — ABNORMAL HIGH (ref 65–99)
Potassium: 4.9 mmol/L (ref 3.5–5.3)
Sodium: 138 mmol/L (ref 135–146)
Total Bilirubin: 0.4 mg/dL (ref 0.2–1.2)
Total Protein: 6.4 g/dL (ref 6.1–8.1)
eGFR: 98 mL/min/{1.73_m2} (ref >=60)

## 2022-11-04 LAB — MICROALBUMIN / CREATININE URINE RATIO
Creatinine, Urine: 45 mg/dL (ref 20–275)
Microalb Creat Ratio: 20 mcg/mg creat (ref <30)
Microalb, Ur: 0.9 mg/dL

## 2022-11-04 LAB — LIPID PANEL
Cholesterol: 172 mg/dL (ref <200)
HDL: 43 mg/dL — ABNORMAL LOW (ref >=50)
LDL Cholesterol (Calc): 102 mg/dL (calc) — ABNORMAL HIGH
Non-HDL Cholesterol (Calc): 129 mg/dL (calc) (ref <130)
Total CHOL/HDL Ratio: 4 (calc) (ref <5.0)
Triglycerides: 171 mg/dL — ABNORMAL HIGH (ref <150)

## 2022-11-04 LAB — CBC
HCT: 46.1 % — ABNORMAL HIGH (ref 35.0–45.0)
Hemoglobin: 15.5 g/dL (ref 11.7–15.5)
MCH: 30.1 pg (ref 27.0–33.0)
MCHC: 33.6 g/dL (ref 32.0–36.0)
MCV: 89.5 fL (ref 80.0–100.0)
MPV: 9.7 fL (ref 7.5–12.5)
Platelets: 278 10*3/uL (ref 140–400)
RBC: 5.15 10*6/uL — ABNORMAL HIGH (ref 3.80–5.10)
RDW: 12.2 % (ref 11.0–15.0)
WBC: 7.3 10*3/uL (ref 3.8–10.8)

## 2022-11-04 LAB — T4, FREE: Free T4: 1.1 ng/dL (ref 0.8–1.8)

## 2022-11-04 LAB — HEMOGLOBIN A1C
Hgb A1c MFr Bld: 9.5 % of total Hgb — ABNORMAL HIGH (ref <5.7)
Mean Plasma Glucose: 226 mg/dL
eAG (mmol/L): 12.5 mmol/L

## 2022-11-04 LAB — TSH: TSH: 7.04 mIU/L — ABNORMAL HIGH

## 2022-11-04 NOTE — Telephone Encounter (Signed)
Med reordered 11/03/2022 #180 tablets  Requested Prescriptions  Refused Prescriptions Disp Refills   glipiZIDE (GLUCOTROL) 10 MG tablet [Pharmacy Med Name: glipiZIDE 10 MG Oral Tablet] 90 tablet 0    Sig: TAKE 1/2 (ONE-HALF) TABLET BY MOUTH TWICE DAILY BEFORE A MEAL     Endocrinology:  Diabetes - Sulfonylureas Failed - 11/04/2022  9:15 AM      Failed - HBA1C is between 0 and 7.9 and within 180 days    Hemoglobin A1C  Date Value Ref Range Status  11/03/2022 10.6 (A) 4.0 - 5.6 % Final   Hgb A1c MFr Bld  Date Value Ref Range Status  11/03/2022 9.5 (H) <5.7 % of total Hgb Final    Comment:    For someone without known diabetes, a hemoglobin A1c value of 6.5% or greater indicates that they may have  diabetes and this should be confirmed with a follow-up  test. . For someone with known diabetes, a value <7% indicates  that their diabetes is well controlled and a value  greater than or equal to 7% indicates suboptimal  control. A1c targets should be individualized based on  duration of diabetes, age, comorbid conditions, and  other considerations. . Currently, no consensus exists regarding use of hemoglobin A1c for diagnosis of diabetes for children. .          Passed - Cr in normal range and within 360 days    Creat  Date Value Ref Range Status  11/03/2022 0.73 0.50 - 1.03 mg/dL Final   Creatinine, Urine  Date Value Ref Range Status  11/03/2022 45 20 - 275 mg/dL Final         Passed - Valid encounter within last 6 months    Recent Outpatient Visits           Yesterday Acquired hypothyroidism   [place], NP   8 months ago Screening for colon cancer   [place], NP   11 months ago Encounter for general adult medical examination with abnormal findings   [facility] [place], Coralie Keens, NP   1 year ago Bacterial conjunctivitis of right eye   [facility] [place], Coralie Keens, NP    1 year ago Acquired hypothyroidism   [facility] [place], Coralie Keens, NP

## 2022-11-05 ENCOUNTER — Other Ambulatory Visit: Payer: Self-pay

## 2022-11-05 MED ORDER — LEVOTHYROXINE SODIUM 150 MCG PO TABS: 150.0000 ug | ORAL_TABLET | Freq: Every day | ORAL | 0 refills | Status: DC

## 2022-11-05 NOTE — Telephone Encounter (Signed)
Requested medication (s) are due for refill today:   Not sure  Requested medication (s) are on the active medication list:   Yes  Future visit scheduled:   No but Rollene Fare saw her on 11/03/2022   Last ordered: There is a discrepancy in the dosage of the glipizide.   One order:   11/03/2022 #180, 0 refills to take a whole tablet twice a day.   On 11/04/2022 There is an order for #90, 0 refills but to take 1/2 a tablet twice a day.   Got a duplicate order for this medication but not sure which dosage is correct.    Requested Prescriptions  Pending Prescriptions Disp Refills   glipiZIDE (GLUCOTROL) 10 MG tablet [Pharmacy Med Name: glipiZIDE 10 MG Oral Tablet] 90 tablet 0    Sig: TAKE 1/2 (ONE-HALF) TABLET BY MOUTH TWICE DAILY BEFORE A MEAL     Endocrinology:  Diabetes - Sulfonylureas Failed - 11/04/2022  5:52 PM      Failed - HBA1C is between 0 and 7.9 and within 180 days    Hemoglobin A1C  Date Value Ref Range Status  11/03/2022 10.6 (A) 4.0 - 5.6 % Final   Hgb A1c MFr Bld  Date Value Ref Range Status  11/03/2022 9.5 (H) <5.7 % of total Hgb Final    Comment:    For someone without known diabetes, a hemoglobin A1c value of 6.5% or greater indicates that they may have  diabetes and this should be confirmed with a follow-up  test. . For someone with known diabetes, a value <7% indicates  that their diabetes is well controlled and a value  greater than or equal to 7% indicates suboptimal  control. A1c targets should be individualized based on  duration of diabetes, age, comorbid conditions, and  other considerations. . Currently, no consensus exists regarding use of hemoglobin A1c for diagnosis of diabetes for children. .          Passed - Cr in normal range and within 360 days    Creat  Date Value Ref Range Status  11/03/2022 0.73 0.50 - 1.03 mg/dL Final   Creatinine, Urine  Date Value Ref Range Status  11/03/2022 45 20 - 275 mg/dL Final         Passed - Valid encounter  within last 6 months    Recent Outpatient Visits           2 days ago Acquired hypothyroidism   [place], Coralie Keens, NP   8 months ago Screening for colon cancer   [place], NP   11 months ago Encounter for general adult medical examination with abnormal findings   [facility] [place], Coralie Keens, NP   1 year ago Bacterial conjunctivitis of right eye   [facility] [place], Coralie Keens, NP   1 year ago Acquired hypothyroidism   [facility] [place], Coralie Keens, NP

## 2022-12-09 ENCOUNTER — Other Ambulatory Visit: Payer: Self-pay | Admitting: Internal Medicine

## 2022-12-09 NOTE — Telephone Encounter (Signed)
Requested Prescriptions  Pending Prescriptions Disp Refills   sertraline (ZOLOFT) 50 MG tablet [Pharmacy Med Name: Sertraline HCl 50 MG Oral Tablet] 90 tablet 0    Sig: Take 1 tablet by mouth once daily     Psychiatry:  Antidepressants - SSRI - sertraline Passed - 12/09/2022 10:15 AM      Passed - AST in normal range and within 360 days    AST  Date Value Ref Range Status  11/03/2022 13 10 - 35 U/L Final         Passed - ALT in normal range and within 360 days    ALT  Date Value Ref Range Status  11/03/2022 21 6 - 29 U/L Final         Passed - Completed PHQ-2 or PHQ-9 in the last 360 days      Passed - Valid encounter within last 6 months    Recent Outpatient Visits           1 month ago Acquired hypothyroidism   [place], Coralie Keens, NP   9 months ago Screening for colon cancer   [facility] [place], Coralie Keens, NP   1 year ago Encounter for general adult medical examination with abnormal findings   [facility] [place], Coralie Keens, NP   1 year ago Bacterial conjunctivitis of right eye   [facility] [place], Coralie Keens, NP   1 year ago Acquired hypothyroidism   [facility] [place], Coralie Keens, [place]

## 2022-12-15 ENCOUNTER — Encounter: Payer: Self-pay | Admitting: Internal Medicine

## 2022-12-16 MED ORDER — TIRZEPATIDE 2.5 MG/0.5ML ~~LOC~~ SOAJ: 2.5000 mg | SUBCUTANEOUS | 0 refills | Status: DC

## 2023-01-05 ENCOUNTER — Other Ambulatory Visit: Payer: Self-pay | Admitting: Internal Medicine

## 2023-01-06 NOTE — Telephone Encounter (Signed)
Requested Prescriptions  Pending Prescriptions Disp Refills   celecoxib (CELEBREX) 50 MG capsule [Pharmacy Med Name: Celecoxib 50 MG Oral Capsule] 180 capsule 0    Sig: Take 1 capsule by mouth twice daily     Analgesics:  COX2 Inhibitors Failed - 01/05/2023 10:27 AM      Failed - Manual Review: Labs are only required if the patient has taken medication for more than 8 weeks.      Failed - HCT in normal range and within 360 days    HCT  Date Value Ref Range Status  11/03/2022 46.1 (H) 35.0 - 45.0 % Final         Passed - HGB in normal range and within 360 days    Hemoglobin  Date Value Ref Range Status  11/03/2022 15.5 11.7 - 15.5 g/dL Final         Passed - Cr in normal range and within 360 days    Creat  Date Value Ref Range Status  11/03/2022 0.73 0.50 - 1.03 mg/dL Final   Creatinine, Urine  Date Value Ref Range Status  11/03/2022 45 20 - 275 mg/dL Final         Passed - AST in normal range and within 360 days    AST  Date Value Ref Range Status  11/03/2022 13 10 - 35 U/L Final         Passed - ALT in normal range and within 360 days    ALT  Date Value Ref Range Status  11/03/2022 21 6 - 29 U/L Final         Passed - eGFR is 30 or above and within 360 days    GFR, Estimated  Date Value Ref Range Status  08/05/2020 >60 >60 mL/min Final   GFR  Date Value Ref Range Status  04/30/2020 83.89 >60.00 mL/min Final   eGFR  Date Value Ref Range Status  11/03/2022 98 > OR = 60 mL/min/1.33m2 Final         Passed - Patient is not pregnant      Passed - Valid encounter within last 12 months    Recent Outpatient Visits           2 months ago Acquired hypothyroidism   [place], Coralie Keens, NP   10 months ago Screening for colon cancer   [place], Coralie Keens, NP   1 year ago Encounter for general adult medical examination with abnormal findings   [facility] [place],  Coralie Keens, NP   1 year ago Bacterial conjunctivitis of right eye   [facility] [place], Coralie Keens, NP   1 year ago Acquired hypothyroidism   [facility] [place], Coralie Keens, [place]

## 2023-01-11 ENCOUNTER — Encounter: Payer: Self-pay | Admitting: Internal Medicine

## 2023-01-12 MED ORDER — TIRZEPATIDE 5 MG/0.5ML ~~LOC~~ SOAJ: 5.0000 mg | SUBCUTANEOUS | 0 refills | Status: DC

## 2023-01-27 ENCOUNTER — Other Ambulatory Visit: Payer: Self-pay | Admitting: Internal Medicine

## 2023-01-28 NOTE — Telephone Encounter (Signed)
Requested Prescriptions  Pending Prescriptions Disp Refills   losartan (COZAAR) 100 MG tablet [Pharmacy Med Name: Losartan Potassium 100 MG Oral Tablet] 90 tablet 0    Sig: Take 1 tablet by mouth once daily     Cardiovascular:  Angiotensin Receptor Blockers Failed - 01/27/2023  9:38 AM      Failed - Last BP in normal range    BP Readings from Last 1 Encounters:  11/03/22 (!) 142/92         Passed - Cr in normal range and within 180 days    Creat  Date Value Ref Range Status  11/03/2022 0.73 0.50 - 1.03 mg/dL Final   Creatinine, Urine  Date Value Ref Range Status  11/03/2022 45 20 - 275 mg/dL Final         Passed - K in normal range and within 180 days    Potassium  Date Value Ref Range Status  11/03/2022 4.9 3.5 - 5.3 mmol/L Final         Passed - Patient is not pregnant      Passed - Valid encounter within last 6 months    Recent Outpatient Visits           2 months ago Acquired hypothyroidism   [place] [facility] [place], Salvadore Oxford, NP   11 months ago Screening for colon cancer   [place] [facility] [place], Salvadore Oxford, NP   1 year ago Encounter for general adult medical examination with abnormal findings   [facility] [place], Salvadore Oxford, NP   1 year ago Bacterial conjunctivitis of right eye   [facility] [employer], Salvadore Oxford, NP   1 year ago Acquired hypothyroidism   [place] [facility] [place], Salvadore Oxford, [place]

## 2023-01-31 ENCOUNTER — Other Ambulatory Visit: Payer: Self-pay | Admitting: Internal Medicine

## 2023-02-01 NOTE — Telephone Encounter (Signed)
Requested Prescriptions  Pending Prescriptions Disp Refills   hydrOXYzine (VISTARIL) 25 MG capsule [Pharmacy Med Name: hydrOXYzine Pamoate 25 MG Oral Capsule] 270 capsule 0    Sig: TAKE 1 CAPSULE BY MOUTH EVERY 8 HOURS AS NEEDED     Ear, Nose, and Throat:  Antihistamines 2 Passed - 01/31/2023  7:32 AM      Passed - Cr in normal range and within 360 days    Creat  Date Value Ref Range Status  11/03/2022 0.73 0.50 - 1.03 mg/dL Final   Creatinine, Urine  Date Value Ref Range Status  11/03/2022 45 20 - 275 mg/dL Final         Passed - Valid encounter within last 12 months    Recent Outpatient Visits           3 months ago Acquired hypothyroidism   [facility] [place], Salvadore Oxford, NP   11 months ago Screening for colon cancer   [place] [place], Salvadore Oxford, NP   1 year ago Encounter for general adult medical examination with abnormal findings   [facility] [place], Salvadore Oxford, NP   1 year ago Bacterial conjunctivitis of right eye   [facility] [place], Salvadore Oxford, NP   1 year ago Acquired hypothyroidism   [facility] [place], Salvadore Oxford, [place]

## 2023-02-02 ENCOUNTER — Other Ambulatory Visit: Payer: Self-pay | Admitting: Internal Medicine

## 2023-02-03 NOTE — Telephone Encounter (Signed)
Requested Prescriptions  Pending Prescriptions Disp Refills   levothyroxine (SYNTHROID) 150 MCG tablet [Pharmacy Med Name: Levothyroxine Sodium 150 MCG Oral Tablet] 90 tablet 0    Sig: Take 1 tablet by mouth once daily     Endocrinology:  Hypothyroid Agents Failed - 02/02/2023  1:26 PM      Failed - TSH in normal range and within 360 days    TSH  Date Value Ref Range Status  11/03/2022 7.04 (H) mIU/L Final    Comment:              Reference Range .           > or = 20 Years  0.40-4.50 .                Pregnancy Ranges           First trimester    0.26-2.66           Second trimester   0.55-2.73           Third trimester    0.43-2.91          Passed - Valid encounter within last 12 months    Recent Outpatient Visits           3 months ago Acquired hypothyroidism   [facility] [place], Salvadore Oxford, NP   11 months ago Screening for colon cancer   [place] [facility] [place], Salvadore Oxford, NP   1 year ago Encounter for general adult medical examination with abnormal findings   [facility] [place], Salvadore Oxford, NP   1 year ago Bacterial conjunctivitis of right eye   [place] [facility] [place], Salvadore Oxford, NP   1 year ago Acquired hypothyroidism   [facility] [place], Salvadore Oxford, NP               glipiZIDE (GLUCOTROL) 10 MG tablet [Pharmacy Med Name: glipiZIDE 10 MG Oral Tablet] 180 tablet 0    Sig: TAKE 1 TABLET BY MOUTH TWICE DAILY BEFORE A MEAL     Endocrinology:  Diabetes - Sulfonylureas Failed - 02/02/2023  1:26 PM      Failed - HBA1C is between 0 and 7.9 and within 180 days    Hemoglobin A1C  Date Value Ref Range Status  11/03/2022 10.6 (A) 4.0 - 5.6 % Final   Hgb A1c MFr Bld  Date Value Ref Range Status  11/03/2022 9.5 (H) <5.7 % of total Hgb Final    Comment:    For someone without known diabetes, a hemoglobin A1c value of 6.5% or greater indicates that they  may have  diabetes and this should be confirmed with a follow-up  test. . For someone with known diabetes, a value <7% indicates  that their diabetes is well controlled and a value  greater than or equal to 7% indicates suboptimal  control. A1c targets should be individualized based on  duration of diabetes, age, comorbid conditions, and  other considerations. . Currently, no consensus exists regarding use of hemoglobin A1c for diagnosis of diabetes for children. .          Passed - Cr in normal range and within 360 days    Creat  Date Value Ref Range Status  11/03/2022 0.73 0.50 - 1.03 mg/dL Final   Creatinine, Urine  Date Value Ref Range Status  11/03/2022 45 20 - 275 mg/dL Final  Passed - Valid encounter within last 6 months    Recent Outpatient Visits           3 months ago Acquired hypothyroidism   [facility] [place], Salvadore Oxford, NP   11 months ago Screening for colon cancer   [facility] [place], Salvadore Oxford, NP   1 year ago Encounter for general adult medical examination with abnormal findings   [facility] [place], Salvadore Oxford, NP   1 year ago Bacterial conjunctivitis of right eye   [facility] [place], Salvadore Oxford, NP   1 year ago Acquired hypothyroidism   [facility] [place], Salvadore Oxford, [place]

## 2023-03-15 ENCOUNTER — Other Ambulatory Visit: Payer: Self-pay | Admitting: Internal Medicine

## 2023-03-16 NOTE — Telephone Encounter (Signed)
Requested Prescriptions  Pending Prescriptions Disp Refills   sertraline (ZOLOFT) 50 MG tablet [Pharmacy Med Name: Sertraline HCl 50 MG Oral Tablet] 90 tablet 0    Sig: Take 1 tablet by mouth once daily     Psychiatry:  Antidepressants - SSRI - sertraline Passed - 03/15/2023 10:49 AM      Passed - AST in normal range and within 360 days    AST  Date Value Ref Range Status  11/03/2022 13 10 - 35 U/L Final         Passed - ALT in normal range and within 360 days    ALT  Date Value Ref Range Status  11/03/2022 21 6 - 29 U/L Final         Passed - Completed PHQ-2 or PHQ-9 in the last 360 days      Passed - Valid encounter within last 6 months    Recent Outpatient Visits           4 months ago Acquired hypothyroidism   [facility] [place], Salvadore Oxford, NP   1 year ago Screening for colon cancer   [place] [facility] [place], Salvadore Oxford, NP   1 year ago Encounter for general adult medical examination with abnormal findings   [facility] [place], Salvadore Oxford, NP   1 year ago Bacterial conjunctivitis of right eye   [place] [facility] [place], Salvadore Oxford, NP   1 year ago Acquired hypothyroidism   [place] [facility] [place], Salvadore Oxford, [place]

## 2023-04-12 ENCOUNTER — Encounter: Payer: Self-pay | Admitting: Internal Medicine

## 2023-04-20 ENCOUNTER — Ambulatory Visit (INDEPENDENT_AMBULATORY_CARE_PROVIDER_SITE_OTHER): Payer: 59 | Admitting: Internal Medicine

## 2023-04-20 VITALS — BP 106/68 | HR 87 | Temp 96.4°F | Ht 68.75 in | Wt 286.0 lb

## 2023-04-20 DIAGNOSIS — Z0001 Encounter for general adult medical examination with abnormal findings: Secondary | ICD-10-CM | POA: Diagnosis not present

## 2023-04-20 DIAGNOSIS — E039 Hypothyroidism, unspecified: Secondary | ICD-10-CM

## 2023-04-20 DIAGNOSIS — E119 Type 2 diabetes mellitus without complications: Secondary | ICD-10-CM | POA: Diagnosis not present

## 2023-04-20 DIAGNOSIS — Z6841 Body Mass Index (BMI) 40.0 and over, adult: Secondary | ICD-10-CM

## 2023-04-20 MED ORDER — SERTRALINE HCL 100 MG PO TABS: 100.0000 mg | ORAL_TABLET | Freq: Every day | ORAL | 1 refills | Status: DC

## 2023-04-20 NOTE — Patient Instructions (Signed)

## 2023-04-20 NOTE — Assessment & Plan Note (Signed)
Encouraged diet and exercise for weight loss ?

## 2023-04-20 NOTE — Progress Notes (Signed)
Subjective:    Patient ID: Donna Whitaker, female    DOB: 11/29/1970, 52 y.o.   MRN: [id number]  HPI  Patient presents to clinic today for her annual exam.  Flu: Never Tetanus: 11/2017 COVID: Never Pneumovax: Never Shingrix: Never Pap smear: 11/2017 Mammogram: 07/2020 Colon screening: never Vision screening: as needed Dentist: as needed  Diet: She does eat meat. She consumes fruits and veggies. She does eat some fried foods. She drinks mostly water, dt soda, gatorade. Exercise: Walking  Review of Systems  Past Medical History:  Diagnosis Date   Allergy    Anxiety    Arthritis    knees, back   COPD (chronic obstructive pulmonary disease) (HCC)    uses albuterol as needed   Depression    Diabetes mellitus without complication (HCC)    Heavy smoker    2ppd   History of chicken pox    Hypertension    Hypothyroidism    Intraductal papilloma of breast, left     Current Outpatient Medications  Medication Sig Dispense Refill   celecoxib (CELEBREX) 50 MG capsule Take 1 capsule by mouth twice daily 180 capsule 0   Cholecalciferol (EQL VITAMIN D3) 50 MCG (2000 UT) CAPS Take by mouth.     fexofenadine (ALLEGRA) 180 MG tablet Take 180 mg by mouth daily.     glipiZIDE (GLUCOTROL) 10 MG tablet TAKE 1 TABLET BY MOUTH TWICE DAILY BEFORE A MEAL 180 tablet 0   hydrOXYzine (VISTARIL) 25 MG capsule TAKE 1 CAPSULE BY MOUTH EVERY 8 HOURS AS NEEDED 270 capsule 0   ibuprofen (ADVIL,MOTRIN) 200 MG tablet Take 800 mg by mouth daily as needed.     Lancets (ONETOUCH ULTRASOFT) lancets 1 each by Other route in the morning and at bedtime. 200 each 1   levothyroxine (SYNTHROID) 150 MCG tablet Take 1 tablet by mouth once daily 90 tablet 0   losartan (COZAAR) 100 MG tablet Take 1 tablet by mouth once daily 90 tablet 0   olopatadine (PATADAY) 0.1 % ophthalmic solution Place 1 drop into the left eye daily. 5 mL 0   ondansetron (ZOFRAN-ODT) 4 MG disintegrating tablet Take 1 tablet (4 mg total) by  mouth every 8 (eight) hours as needed for nausea or vomiting. 20 tablet 0   ONETOUCH ULTRA test strip 1 EACH BY OTHER ROUTE IN THE MORNING AND AT BEDTIME. 200 strip 1   sertraline (ZOLOFT) 50 MG tablet Take 1 tablet by mouth once daily 90 tablet 0   tirzepatide (MOUNJARO) 5 MG/0.5ML Pen Inject 5 mg into the skin once a week. 6 mL 0   trimethoprim-polymyxin b (POLYTRIM) ophthalmic solution Place 1 drop into the right eye every 4 (four) hours. 10 mL 0   No current facility-administered medications for this visit.    Allergies  Allergen Reactions   Betadine [Povidone Iodine] Other (See Comments)    Skin irritation   Vicodin [Hydrocodone-Acetaminophen] Palpitations    Chest pain   Hydrocodone-Acetaminophen    Iodine     Family History  Problem Relation Age of Onset   Arthritis Mother    Hyperlipidemia Mother    Alcohol abuse Father    Hyperlipidemia Father    Heart disease Father    Hypertension Father    Arthritis Maternal Grandmother    Hyperlipidemia Maternal Grandmother    Heart disease Maternal Grandmother    Hypertension Maternal Grandmother    Diabetes Maternal Grandmother    Hyperlipidemia Maternal Grandfather    Hypertension Maternal Grandfather  Hyperlipidemia Paternal Grandmother    Hypertension Paternal Grandmother    Stomach cancer Paternal Grandmother    Alcohol abuse Paternal Grandfather    Hyperlipidemia Paternal Grandfather    Heart disease Paternal Grandfather    Hypertension Paternal Grandfather     Social History   Socioeconomic History   Marital status: Married    Spouse name: Not on file   Number of children: Not on file   Years of education: Not on file   Highest education level: Not on file  Occupational History   Not on file  Tobacco Use   Smoking status: Every Day    Packs/day: 2.00    Years: 35.00    Additional pack years: 0.00    Total pack years: 70.00    Types: Cigarettes   Smokeless tobacco: Never  Vaping Use   Vaping Use:  Never used  Substance and Sexual Activity   Alcohol use: Yes    Comment: occasional   Drug use: No   Sexual activity: Yes    Birth control/protection: Post-menopausal, Surgical    Comment: BTL  Other Topics Concern   Not on file  Social History Narrative   ** Merged History Encounter **       Social Determinants of Health   Financial Resource Strain: Not on file  Food Insecurity: Not on file  Transportation Needs: Not on file  Physical Activity: Not on file  Stress: Not on file  Social Connections: Not on file  Intimate Partner Violence: Not on file     Constitutional: Denies fever, malaise, fatigue, headache or abrupt weight changes.  HEENT: Denies eye pain, eye redness, ear pain, ringing in the ears, wax buildup, runny nose, nasal congestion, bloody nose, or sore throat. Respiratory: Denies difficulty breathing, shortness of breath, cough or sputum production.   Cardiovascular: Denies chest pain, chest tightness, palpitations or swelling in the hands or feet.  Gastrointestinal: Pt reports diarrhea. Denies abdominal pain, bloating, constipation, diarrhea or blood in the stool.  GU: Denies urgency, frequency, pain with urination, burning sensation, blood in urine, odor or discharge. Musculoskeletal: Patient reports joint pain.  Denies decrease in range of motion, difficulty with gait, muscle pain or joint swelling.  Skin: Denies redness, rashes, lesions or ulcercations.  Neurological: Patient reports insomnia.  Denies dizziness, difficulty with memory, difficulty with speech or problems with balance and coordination.  Psych: Patient has a history of anxiety and depression.  Denies SI/HI.  No other specific complaints in a complete review of systems (except as listed in HPI above).     Objective:   Physical Exam   BP 106/68 (BP Location: Left Arm, Patient Position: Sitting, Cuff Size: Large)   Pulse 87   Temp (!) 96.4 F (35.8 C) (Temporal)   Ht 5' 8.75" (1.746 m)   Wt  286 lb (129.7 kg)   LMP 05/19/2016 Comment: BTL  SpO2 95%   BMI 42.54 kg/m   Wt Readings from Last 3 Encounters:  11/03/22 (!) 321 lb (145.6 kg)  02/23/22 (!) 312 lb (141.5 kg)  11/24/21 (!) 316 lb (143.3 kg)    General: Appears her stated age, obese, in NAD. Skin: Warm, dry and intact. No ulcerations noted. HEENT: Head: normal shape and size; Eyes: sclera white, no icterus, conjunctiva pink, PERRLA and EOMs intact;  Neck:  Neck supple, trachea midline. No masses, lumps or thyromegaly present.  Cardiovascular: Normal rate and rhythm. S1,S2 noted.  No murmur, rubs or gallops noted. No JVD or BLE edema. No  carotid bruits noted. Pulmonary/Chest: Normal effort and positive vesicular breath sounds. No respiratory distress. No wheezes, rales or ronchi noted.  Abdomen: Soft and nontender. Normal bowel sounds.  Musculoskeletal: Strength 5/5 BUE/BLE. No difficulty with gait.  Neurological: Alert and oriented. Cranial nerves II-XII grossly intact. Coordination normal.  Psychiatric: Mood and affect normal. Behavior is normal. Judgment and thought content normal.    BMET    Component Value Date/Time   NA 138 11/03/2022 1129   K 4.9 11/03/2022 1129   CL 98 11/03/2022 1129   CO2 33 (H) 11/03/2022 1129   GLUCOSE 203 (H) 11/03/2022 1129   BUN 9 11/03/2022 1129   CREATININE 0.73 11/03/2022 1129   CALCIUM 8.9 11/03/2022 1129   GFRNONAA >60 08/05/2020 0820    Lipid Panel     Component Value Date/Time   CHOL 172 11/03/2022 1129   TRIG 171 (H) 11/03/2022 1129   HDL 43 (L) 11/03/2022 1129   CHOLHDL 4.0 11/03/2022 1129   VLDL 20.6 04/30/2020 0921   LDLCALC 102 (H) 11/03/2022 1129    CBC    Component Value Date/Time   WBC 7.3 11/03/2022 1129   RBC 5.15 (H) 11/03/2022 1129   HGB 15.5 11/03/2022 1129   HCT 46.1 (H) 11/03/2022 1129   PLT 278 11/03/2022 1129   MCV 89.5 11/03/2022 1129   MCH 30.1 11/03/2022 1129   MCHC 33.6 11/03/2022 1129   RDW 12.2 11/03/2022 1129    Hgb A1C Lab  Results  Component Value Date   HGBA1C 10.6 (A) 11/03/2022           Assessment & Plan:   Preventative health maintenance:  Encouraged her to get a flu shot in the fall Tetanus UTD Encouraged her to get her COVID-vaccine Pneumovax declined Discussed Shingrix vaccine, she will check coverage with her insurance company and schedule visit if she would like to have this done Pap smear declined today She declines to schedule her mammogram She declines referral to GI for screening colonoscopy Encouraged her to consume a balanced diet and exercise regimen Advised her to see an eye doctor and dentist annually We will check CBC, c-Met, TSH, free T4, lipid, A1c today  RTC in 3 months, follow-up chronic conditions Nicki Reaper, NP

## 2023-04-21 ENCOUNTER — Encounter: Payer: Self-pay | Admitting: Internal Medicine

## 2023-04-21 ENCOUNTER — Other Ambulatory Visit: Payer: Self-pay | Admitting: Internal Medicine

## 2023-04-21 LAB — COMPREHENSIVE METABOLIC PANEL
ALT: 35 IU/L — ABNORMAL HIGH (ref 0–32)
AST: 26 IU/L (ref 0–40)
Albumin: 4.1 g/dL (ref 3.8–4.9)
Alkaline Phosphatase: 122 IU/L — ABNORMAL HIGH (ref 44–121)
BUN/Creatinine Ratio: 13 (ref 9–23)
BUN: 9 mg/dL (ref 6–24)
Bilirubin Total: 0.4 mg/dL (ref 0.0–1.2)
CO2: 24 mmol/L (ref 20–29)
Calcium: 9.6 mg/dL (ref 8.7–10.2)
Chloride: 98 mmol/L (ref 96–106)
Creatinine, Ser: 0.72 mg/dL (ref 0.57–1.00)
Globulin, Total: 2.2 g/dL (ref 1.5–4.5)
Glucose: 253 mg/dL — ABNORMAL HIGH (ref 70–99)
Potassium: 4.6 mmol/L (ref 3.5–5.2)
Sodium: 139 mmol/L (ref 134–144)
Total Protein: 6.3 g/dL (ref 6.0–8.5)
eGFR: 99 mL/min/{1.73_m2} (ref >59)

## 2023-04-21 LAB — CBC
Hematocrit: 47.9 % — ABNORMAL HIGH (ref 34.0–46.6)
Hemoglobin: 15.8 g/dL (ref 11.1–15.9)
MCH: 29.5 pg (ref 26.6–33.0)
MCHC: 33 g/dL (ref 31.5–35.7)
MCV: 89 fL (ref 79–97)
Platelets: 294 10*3/uL (ref 150–450)
RBC: 5.36 x10E6/uL — ABNORMAL HIGH (ref 3.77–5.28)
RDW: 12.4 % (ref 11.7–15.4)
WBC: 14.9 10*3/uL — ABNORMAL HIGH (ref 3.4–10.8)

## 2023-04-21 LAB — LIPID PANEL
Chol/HDL Ratio: 3.9 ratio (ref 0.0–4.4)
Cholesterol, Total: 170 mg/dL (ref 100–199)
HDL: 44 mg/dL (ref >39)
LDL Chol Calc (NIH): 95 mg/dL (ref 0–99)
Triglycerides: 182 mg/dL — ABNORMAL HIGH (ref 0–149)
VLDL Cholesterol Cal: 31 mg/dL (ref 5–40)

## 2023-04-21 LAB — TSH: TSH: 0.336 u[IU]/mL — ABNORMAL LOW (ref 0.450–4.500)

## 2023-04-21 LAB — T4, FREE: Free T4: 1.53 ng/dL (ref 0.82–1.77)

## 2023-04-21 LAB — HEMOGLOBIN A1C
Est. average glucose Bld gHb Est-mCnc: 151 mg/dL
Hgb A1c MFr Bld: 6.9 % — ABNORMAL HIGH (ref 4.8–5.6)

## 2023-04-21 MED ORDER — HYDROXYZINE PAMOATE 50 MG PO CAPS: 50.0000 mg | ORAL_CAPSULE | Freq: Two times a day (BID) | ORAL | 1 refills | Status: DC

## 2023-04-21 NOTE — Addendum Note (Signed)
Addended by: Lorre Munroe on: 04/21/2023 03:40 PM   Modules accepted: Orders

## 2023-04-21 NOTE — Telephone Encounter (Signed)
Requested Prescriptions  Pending Prescriptions Disp Refills   hydrOXYzine (VISTARIL) 25 MG capsule [Pharmacy Med Name: hydrOXYzine Pamoate 25 MG Oral Capsule] 270 capsule 0    Sig: TAKE 1 CAPSULE BY MOUTH EVERY 8 HOURS AS NEEDED     Ear, Nose, and Throat:  Antihistamines 2 Passed - 04/21/2023  6:31 AM      Passed - Cr in normal range and within 360 days    Creat  Date Value Ref Range Status  11/03/2022 0.73 0.50 - 1.03 mg/dL Final   Creatinine, Ser  Date Value Ref Range Status  04/20/2023 0.72 0.57 - 1.00 mg/dL Final   Creatinine, Urine  Date Value Ref Range Status  11/03/2022 45 20 - 275 mg/dL Final         Passed - Valid encounter within last 12 months    Recent Outpatient Visits           Yesterday Encounter for general adult medical examination with abnormal findings   [facility] [place], Salvadore Oxford, NP   5 months ago Acquired hypothyroidism   [place] [facility] [place], Salvadore Oxford, NP   1 year ago Screening for colon cancer   [place] [facility] [place], Salvadore Oxford, NP   1 year ago Encounter for general adult medical examination with abnormal findings   [place] [place], Salvadore Oxford, NP   1 year ago Bacterial conjunctivitis of right eye   [place] [facility] [place], Salvadore Oxford, NP       Future Appointments             In 3 months Ba[place], Salvadore Oxford, NP [facility], [facility]

## 2023-04-22 ENCOUNTER — Encounter: Payer: Self-pay | Admitting: Internal Medicine

## 2023-04-23 MED ORDER — LEVOTHYROXINE SODIUM 125 MCG PO TABS: 125.0000 ug | ORAL_TABLET | Freq: Every day | ORAL | 0 refills | Status: DC

## 2023-04-23 MED ORDER — TIRZEPATIDE 7.5 MG/0.5ML ~~LOC~~ SOAJ: 7.5000 mg | SUBCUTANEOUS | 0 refills | Status: DC

## 2023-05-15 ENCOUNTER — Other Ambulatory Visit: Payer: Self-pay | Admitting: Internal Medicine

## 2023-05-17 NOTE — Telephone Encounter (Signed)
Requested Prescriptions  Pending Prescriptions Disp Refills   glipiZIDE (GLUCOTROL) 10 MG tablet [Pharmacy Med Name: glipiZIDE 10 MG Oral Tablet] 180 tablet 0    Sig: TAKE 1 TABLET BY MOUTH TWICE DAILY BEFORE A MEAL     Endocrinology:  Diabetes - Sulfonylureas Passed - 05/15/2023  2:05 PM      Passed - HBA1C is between 0 and 7.9 and within 180 days    Hgb A1c MFr Bld  Date Value Ref Range Status  04/20/2023 6.9 (H) 4.8 - 5.6 % Final    Comment:             Prediabetes: 5.7 - 6.4          Diabetes: >6.4          Glycemic control for adults with diabetes: <7.0          Passed - Cr in normal range and within 360 days    Creat  Date Value Ref Range Status  11/03/2022 0.73 0.50 - 1.03 mg/dL Final   Creatinine, Ser  Date Value Ref Range Status  04/20/2023 0.72 0.57 - 1.00 mg/dL Final   Creatinine, Urine  Date Value Ref Range Status  11/03/2022 45 20 - 275 mg/dL Final         Passed - Valid encounter within last 6 months    Recent Outpatient Visits           3 weeks ago Encounter for general adult medical examination with abnormal findings   [facility] [place], Salvadore Oxford, NP   6 months ago Acquired hypothyroidism   [place] [facility] [place], Salvadore Oxford, NP   1 year ago Screening for colon cancer   [facility] [place], Salvadore Oxford, NP   1 year ago Encounter for general adult medical examination with abnormal findings   [place] [facility] [place], Salvadore Oxford, NP   1 year ago Bacterial conjunctivitis of right eye   [place] [facility] [place], Salvadore Oxford, NP       Future Appointments             In 2 months Baity, Salvadore Oxford, NP [place] [facility], [facility]

## 2023-06-17 ENCOUNTER — Other Ambulatory Visit: Payer: Self-pay | Admitting: Internal Medicine

## 2023-06-18 NOTE — Telephone Encounter (Signed)
Requested Prescriptions  Pending Prescriptions Disp Refills   losartan (COZAAR) 100 MG tablet [Pharmacy Med Name: Losartan Potassium 100 MG Oral Tablet] 90 tablet 1    Sig: Take 1 tablet by mouth once daily     Cardiovascular:  Angiotensin Receptor Blockers Passed - 06/17/2023  2:03 PM      Passed - Cr in normal range and within 180 days    Creat  Date Value Ref Range Status  11/03/2022 0.73 0.50 - 1.03 mg/dL Final   Creatinine, Ser  Date Value Ref Range Status  04/20/2023 0.72 0.57 - 1.00 mg/dL Final   Creatinine, Urine  Date Value Ref Range Status  11/03/2022 45 20 - 275 mg/dL Final         Passed - K in normal range and within 180 days    Potassium  Date Value Ref Range Status  04/20/2023 4.6 3.5 - 5.2 mmol/L Final         Passed - Patient is not pregnant      Passed - Last BP in normal range    BP Readings from Last 1 Encounters:  04/20/23 106/68         Passed - Valid encounter within last 6 months    Recent Outpatient Visits           1 month ago Encounter for general adult medical examination with abnormal findings   [place] [facility] [place], Salvadore Oxford, NP   7 months ago Acquired hypothyroidism   [facility] [place], Salvadore Oxford, NP   1 year ago Screening for colon cancer   [place] [facility] [place], Salvadore Oxford, NP   1 year ago Encounter for general adult medical examination with abnormal findings   [place] [facility] [place], Salvadore Oxford, NP   1 year ago Bacterial conjunctivitis of right eye   [place] [facility], Salvadore Oxford, NP       Future Appointments             In 1 month [place], Salvadore Oxford, NP [place] [facility], [facility]

## 2023-07-11 ENCOUNTER — Other Ambulatory Visit: Payer: Self-pay | Admitting: Internal Medicine

## 2023-07-13 NOTE — Telephone Encounter (Signed)
Requested medication (s) are due for refill today: Yes  Requested medication (s) are on the active medication list: Yes  Last refill:  04/23/23  Future visit scheduled: Yes  Notes to clinic:  Manual review.    Requested Prescriptions  Pending Prescriptions Disp Refills   MOUNJARO 7.5 MG/0.5ML Pen [Pharmacy Med Name: Mounjaro 7.5 MG/0.5ML Subcutaneous Solution Pen-injector] 12 mL 0    Sig: INJECT 7.5MG  INTO THE SKIN  ONCE A WEEK     Off-Protocol Failed - 07/11/2023  7:32 AM      Failed - Medication not assigned to a protocol, review manually.      Passed - Valid encounter within last 12 months    Recent Outpatient Visits           2 months ago Encounter for general adult medical examination with abnormal findings   [place] [place] [place], Salvadore Oxford, NP   8 months ago Acquired hypothyroidism   [place] [facility] [place], Salvadore Oxford, NP   1 year ago Screening for colon cancer   [facility] [place], Salvadore Oxford, NP   1 year ago Encounter for general adult medical examination with abnormal findings   [place] [place], Salvadore Oxford, NP   1 year ago Bacterial conjunctivitis of right eye   [place] [facility] [place], Salvadore Oxford, NP       Future Appointments             In 1 week [place], Salvadore Oxford, NP [facility], [facility]

## 2023-07-16 ENCOUNTER — Encounter: Payer: Self-pay | Admitting: Internal Medicine

## 2023-07-19 ENCOUNTER — Other Ambulatory Visit: Payer: Self-pay | Admitting: Internal Medicine

## 2023-07-20 ENCOUNTER — Other Ambulatory Visit: Payer: Self-pay | Admitting: Internal Medicine

## 2023-07-20 MED ORDER — TIRZEPATIDE 5 MG/0.5ML ~~LOC~~ SOAJ: 5.0000 mg | SUBCUTANEOUS | 0 refills | Status: DC

## 2023-07-20 NOTE — Telephone Encounter (Signed)
Requested Prescriptions  Pending Prescriptions Disp Refills   levothyroxine (SYNTHROID) 125 MCG tablet [Pharmacy Med Name: Levothyroxine Sodium 125 MCG Oral Tablet] 90 tablet 0    Sig: Take 1 tablet by mouth once daily     Endocrinology:  Hypothyroid Agents Failed - 07/19/2023  1:12 PM      Failed - TSH in normal range and within 360 days    TSH  Date Value Ref Range Status  04/20/2023 0.336 (L) 0.450 - 4.500 uIU/mL Final         Passed - Valid encounter within last 12 months    Recent Outpatient Visits           3 months ago Encounter for general adult medical examination with abnormal findings   [place] [facility] [place], Salvadore Oxford, NP   8 months ago Acquired hypothyroidism   [facility] [place] [place], Salvadore Oxford, NP   1 year ago Screening for colon cancer   [facility] [place], Salvadore Oxford, NP   1 year ago Encounter for general adult medical examination with abnormal findings   [facility] [place], Salvadore Oxford, NP   1 year ago Bacterial conjunctivitis of right eye   [facility] [place], Salvadore Oxford, NP       Future Appointments             In 2 weeks Sampson Si, Salvadore Oxford, NP [place] [facility], [facility]

## 2023-07-21 ENCOUNTER — Ambulatory Visit: Payer: 59 | Admitting: Internal Medicine

## 2023-07-21 NOTE — Telephone Encounter (Signed)
Last RF 07/20/23 #90   Requested Prescriptions  Refused Prescriptions Disp Refills   levothyroxine (SYNTHROID) 125 MCG tablet [Pharmacy Med Name: Levothyroxine Sodium 125 MCG Oral Tablet] 90 tablet 0    Sig: Take 1 tablet by mouth once daily     Endocrinology:  Hypothyroid Agents Failed - 07/20/2023  2:34 PM      Failed - TSH in normal range and within 360 days    TSH  Date Value Ref Range Status  04/20/2023 0.336 (L) 0.450 - 4.500 uIU/mL Final         Passed - Valid encounter within last 12 months    Recent Outpatient Visits           3 months ago Encounter for general adult medical examination with abnormal findings   [place] [facility] [place], Salvadore Oxford, NP   8 months ago Acquired hypothyroidism   [place] [facility] [place], Salvadore Oxford, NP   1 year ago Screening for colon cancer   [place] [facility] [place], Salvadore Oxford, NP   1 year ago Encounter for general adult medical examination with abnormal findings   [place] [facility] [place], Salvadore Oxford, NP   1 year ago Bacterial conjunctivitis of right eye   [place] [facility] [place], Salvadore Oxford, NP       Future Appointments             In 1 week Sam[place] Si, Salvadore Oxford, NP [facility], [facility]

## 2023-08-03 ENCOUNTER — Ambulatory Visit (INDEPENDENT_AMBULATORY_CARE_PROVIDER_SITE_OTHER): Payer: 59 | Admitting: Internal Medicine

## 2023-08-03 VITALS — BP 122/78 | HR 84 | Ht 68.75 in | Wt 264.0 lb

## 2023-08-03 DIAGNOSIS — E039 Hypothyroidism, unspecified: Secondary | ICD-10-CM

## 2023-08-03 DIAGNOSIS — F5101 Primary insomnia: Secondary | ICD-10-CM

## 2023-08-03 DIAGNOSIS — E785 Hyperlipidemia, unspecified: Secondary | ICD-10-CM

## 2023-08-03 DIAGNOSIS — F419 Anxiety disorder, unspecified: Secondary | ICD-10-CM

## 2023-08-03 DIAGNOSIS — J432 Centrilobular emphysema: Secondary | ICD-10-CM | POA: Diagnosis not present

## 2023-08-03 DIAGNOSIS — E119 Type 2 diabetes mellitus without complications: Secondary | ICD-10-CM | POA: Diagnosis not present

## 2023-08-03 DIAGNOSIS — I10 Essential (primary) hypertension: Secondary | ICD-10-CM

## 2023-08-03 DIAGNOSIS — D751 Secondary polycythemia: Secondary | ICD-10-CM

## 2023-08-03 DIAGNOSIS — M150 Primary generalized (osteo)arthritis: Secondary | ICD-10-CM

## 2023-08-03 DIAGNOSIS — E1169 Type 2 diabetes mellitus with other specified complication: Secondary | ICD-10-CM | POA: Insufficient documentation

## 2023-08-03 DIAGNOSIS — Z6839 Body mass index (BMI) 39.0-39.9, adult: Secondary | ICD-10-CM

## 2023-08-03 DIAGNOSIS — F32A Depression, unspecified: Secondary | ICD-10-CM

## 2023-08-03 DIAGNOSIS — E66812 Obesity, class 2: Secondary | ICD-10-CM

## 2023-08-03 MED ORDER — TIRZEPATIDE 10 MG/0.5ML ~~LOC~~ SOAJ
10.0000 mg | SUBCUTANEOUS | 1 refills | Status: DC
Start: 2023-08-03 — End: 2024-02-08

## 2023-08-03 MED ORDER — BREZTRI AEROSPHERE 160-9-4.8 MCG/ACT IN AERO
2.0000 | INHALATION_SPRAY | Freq: Two times a day (BID) | RESPIRATORY_TRACT | 11 refills | Status: DC
Start: 2023-08-03 — End: 2024-02-08

## 2023-08-03 NOTE — Assessment & Plan Note (Signed)
Encourage smoking cessation We will start Breztri 2 puffs twice daily, sample provided today Continue albuterol as needed

## 2023-08-03 NOTE — Assessment & Plan Note (Signed)
Encourage weight loss as this cannot reduce joint pain No longer taking Celebrex, will D/C Continue ibuprofen OTC but avoid overuse

## 2023-08-03 NOTE — Assessment & Plan Note (Signed)
Stable on sertraline and hydroxyzine Support offered

## 2023-08-03 NOTE — Patient Instructions (Signed)

## 2023-08-03 NOTE — Assessment & Plan Note (Signed)
C-Met and lipid profile today Encouraged her to consume a low-fat diet She is not taking simvastatin due to side effects but discussed possibility of taking this 3 times weekly to see if she could still get the benefit without the side effects

## 2023-08-03 NOTE — Assessment & Plan Note (Signed)
CBC today Encouraged smoking cessation

## 2023-08-03 NOTE — Progress Notes (Signed)
Subjective:    Patient ID: Donna Whitaker, female    DOB: September 02, 1971, 52 y.o.   MRN: [id number]  HPI  Patient presents to clinic today for 62-month follow-up of chronic conditions.  Hypothyroidism: She denies any issues on her current dose of levothyroxine.  She does not follow with endocrinology.  Insomnia: She has difficulty staying asleep.  She takes hydroxyzine as needed with some relief of symptoms but reports it causes vivid dreams.  There is no sleep study on file.  Anxiety and depression: Chronic, managed on sertraline and hydroxyzine.  She is not currently seeing a therapist.  She denies SI/HI.  OA: Mainly in her back and knees.  She is no longer taking celebrex as prescribed because she prefers to take ibuprofen as needed.  She does not follow with orthopedics.  COPD: She denies cough and shortness of breath.  She uses albuterol only as needed.  There are no PFTs on file.  She does continue to smoke.  She does not follow with pulmonology.  HTN: Her BP today is 122/78.  She is taking losartan as prescribed.  ECG from 07/2020 reviewed.  DM2: Her last A1c was 6.9%, 04/2023.  She is taking glipizide and mounjaro as prescribed.  She would like to increase the mounjaro to 10 mg as she reports she cannot find the 7.5 mg anywhere and the 5 mg is not effective.  She does not check her sugars.  She does not check her feet routinely.  Her last eye exam was >1-year ago.  Flu never.  Pneumovax never.  COVID never.  HLD: Her last LDL was 95, triglycerides 347, 04/2023.  She stopped simvastatin due to GI upset.  She does not consume a low-fat diet.  Polycythemia: Her last H/H was 15.8/47.9, 04/2023.  She does smoke.  She does not follow with hematology.  Review of Systems     Past Medical History:  Diagnosis Date   Allergy    Anxiety    Arthritis    knees, back   COPD (chronic obstructive pulmonary disease) (HCC)    uses albuterol as needed   Depression    Diabetes mellitus without  complication (HCC)    Heavy smoker    2ppd   History of chicken pox    Hypertension    Hypothyroidism    Intraductal papilloma of breast, left     Current Outpatient Medications  Medication Sig Dispense Refill   celecoxib (CELEBREX) 50 MG capsule Take 1 capsule by mouth twice daily 180 capsule 0   Cholecalciferol (EQL VITAMIN D3) 50 MCG (2000 UT) CAPS Take by mouth.     fexofenadine (ALLEGRA) 180 MG tablet Take 180 mg by mouth daily.     glipiZIDE (GLUCOTROL) 10 MG tablet TAKE 1 TABLET BY MOUTH TWICE DAILY BEFORE A MEAL 180 tablet 0   hydrOXYzine (VISTARIL) 50 MG capsule Take 1 capsule (50 mg total) by mouth 2 (two) times daily. 180 capsule 1   Lancets (ONETOUCH ULTRASOFT) lancets 1 each by Other route in the morning and at bedtime. 200 each 1   levothyroxine (SYNTHROID) 125 MCG tablet Take 1 tablet by mouth once daily 90 tablet 0   losartan (COZAAR) 100 MG tablet Take 1 tablet by mouth once daily 90 tablet 1   olopatadine (PATADAY) 0.1 % ophthalmic solution Place 1 drop into the left eye daily. 5 mL 0   ondansetron (ZOFRAN-ODT) 4 MG disintegrating tablet Take 1 tablet (4 mg total) by mouth every 8 (eight)  hours as needed for nausea or vomiting. 20 tablet 0   ONETOUCH ULTRA test strip 1 EACH BY OTHER ROUTE IN THE MORNING AND AT BEDTIME. 200 strip 1   sertraline (ZOLOFT) 100 MG tablet Take 1 tablet (100 mg total) by mouth daily. 90 tablet 1   tirzepatide (MOUNJARO) 5 MG/0.5ML Pen Inject 5 mg into the skin once a week. 6 mL 0   No current facility-administered medications for this visit.    Allergies  Allergen Reactions   Betadine [Povidone Iodine] Other (See Comments)    Skin irritation   Vicodin [Hydrocodone-Acetaminophen] Palpitations    Chest pain   Hydrocodone-Acetaminophen    Iodine     Family History  Problem Relation Age of Onset   Arthritis Mother    Hyperlipidemia Mother    Alcohol abuse Father    Hyperlipidemia Father    Heart disease Father    Hypertension  Father    Arthritis Maternal Grandmother    Hyperlipidemia Maternal Grandmother    Heart disease Maternal Grandmother    Hypertension Maternal Grandmother    Diabetes Maternal Grandmother    Hyperlipidemia Maternal Grandfather    Hypertension Maternal Grandfather    Hyperlipidemia Paternal Grandmother    Hypertension Paternal Grandmother    Stomach cancer Paternal Grandmother    Alcohol abuse Paternal Grandfather    Hyperlipidemia Paternal Grandfather    Heart disease Paternal Grandfather    Hypertension Paternal Grandfather     Social History   Socioeconomic History   Marital status: Married    Spouse name: Not on file   Number of children: Not on file   Years of education: Not on file   Highest education level: GED or equivalent  Occupational History   Not on file  Tobacco Use   Smoking status: Every Day    Current packs/day: 2.00    Average packs/day: 2.0 packs/day for 35.0 years (70.0 ttl pk-yrs)    Types: Cigarettes   Smokeless tobacco: Never  Vaping Use   Vaping status: Never Used  Substance and Sexual Activity   Alcohol use: Yes    Comment: occasional   Drug use: No   Sexual activity: Yes    Birth control/protection: Post-menopausal, Surgical    Comment: BTL  Other Topics Concern   Not on file  Social History Narrative   ** Merged History Encounter **       Social Determinants of Health   Financial Resource Strain: Medium Risk (04/20/2023)   Overall Financial Resource Strain (CARDIA)    Difficulty of Paying Living Expenses: Somewhat hard  Food Insecurity: Food Insecurity Present (04/20/2023)   Hunger Vital Sign    Worried About Running Out of Food in the Last Year: Sometimes true    Ran Out of Food in the Last Year: Patient declined  Transportation Needs: No Transportation Needs (04/20/2023)   PRAPARE - Administrator, Civil Service (Medical): No    Lack of Transportation (Non-Medical): No  Physical Activity: Unknown (04/20/2023)   Exercise  Vital Sign    Days of Exercise per Week: 0 days    Minutes of Exercise per Session: Not on file  Stress: Stress Concern Present (04/20/2023)   [employer] of Occupational Health - Occupational Stress Questionnaire    Feeling of Stress : Very much  Social Connections: Socially Isolated (04/20/2023)   Social Connection and Isolation Panel [NHANES]    Frequency of Communication with Friends and Family: Once a week    Frequency of Social Gatherings  with Friends and Family: Once a week    Attends Religious Services: Never    Database administrator or Organizations: No    Attends Engineer, structural: Not on file    Marital Status: Married  Catering manager Violence: Not on file     Constitutional: Patient reports intermittent headaches.  Denies fever, malaise, fatigue, headache or abrupt weight changes.  HEENT: Denies eye pain, eye redness, ear pain, ringing in the ears, wax buildup, runny nose, nasal congestion, bloody nose, or sore throat. Respiratory:  Denies cough, difficulty breathing, shortness of breath, or sputum production.   Cardiovascular: Denies chest pain, chest tightness, palpitations or swelling in the hands or feet.  Gastrointestinal: Denies abdominal pain, bloating, constipation, diarrhea or blood in the stool.  GU: Denies urgency, frequency, pain with urination, burning sensation, blood in urine, odor or discharge. Musculoskeletal: Patient reports joint pain.  Denies decrease in range of motion, difficulty with gait, muscle pain or joint swelling.  Skin: Denies redness, rashes, lesions or ulcercations.  Neurological: Patient reports insomnia.  Denies dizziness, difficulty with memory, difficulty with speech or problems with balance and coordination.  Psych: Patient has a history of anxiety and depression.  Denies SI/HI.  No other specific complaints in a complete review of systems (except as listed in HPI above).  Objective:   Physical Exam   BP 122/78    Pulse 84   Ht 5' 8.75" (1.746 m)   Wt 264 lb (119.7 kg)   LMP 05/19/2016 Comment: BTL  SpO2 98%   BMI 39.27 kg/m   Wt Readings from Last 3 Encounters:  04/20/23 286 lb (129.7 kg)  11/03/22 (!) 321 lb (145.6 kg)  02/23/22 (!) 312 lb (141.5 kg)    General: Appears her stated age, obese, in NAD. Skin: Warm, dry and intact. No ulcerations noted. HEENT: Head: normal shape and size; Eyes: sclera white, no icterus, conjunctiva pink, PERRLA and EOMs intact;  Neck:  Neck supple, trachea midline. No masses, lumps or thyromegaly present.  Cardiovascular: Normal rate and rhythm. S1,S2 noted.  No murmur, rubs or gallops noted. No JVD or BLE edema. No carotid bruits noted. Pulmonary/Chest: Normal effort and positive vesicular breath sounds. No respiratory distress. No wheezes, rales or ronchi noted.  Musculoskeletal:  No difficulty with gait.  Neurological: Alert and oriented. Coordination normal.  Psychiatric: Mood and affect mildly flat. Behavior is normal. Judgment and thought content normal.    BMET    Component Value Date/Time   NA 139 04/20/2023 1700   K 4.6 04/20/2023 1700   CL 98 04/20/2023 1700   CO2 24 04/20/2023 1700   GLUCOSE 253 (H) 04/20/2023 1700   GLUCOSE 203 (H) 11/03/2022 1129   BUN 9 04/20/2023 1700   CREATININE 0.72 04/20/2023 1700   CREATININE 0.73 11/03/2022 1129   CALCIUM 9.6 04/20/2023 1700   GFRNONAA >60 08/05/2020 0820    Lipid Panel     Component Value Date/Time   CHOL 170 04/20/2023 1700   TRIG 182 (H) 04/20/2023 1700   HDL 44 04/20/2023 1700   CHOLHDL 3.9 04/20/2023 1700   CHOLHDL 4.0 11/03/2022 1129   VLDL 20.6 04/30/2020 0921   LDLCALC 95 04/20/2023 1700   LDLCALC 102 (H) 11/03/2022 1129    CBC    Component Value Date/Time   WBC 14.9 (H) 04/20/2023 1700   WBC 7.3 11/03/2022 1129   RBC 5.36 (H) 04/20/2023 1700   RBC 5.15 (H) 11/03/2022 1129   HGB 15.8 04/20/2023 1700  HCT 47.9 (H) 04/20/2023 1700   PLT 294 04/20/2023 1700   MCV 89  04/20/2023 1700   MCH 29.5 04/20/2023 1700   MCH 30.1 11/03/2022 1129   MCHC 33.0 04/20/2023 1700   MCHC 33.6 11/03/2022 1129   RDW 12.4 04/20/2023 1700    Hgb A1C Lab Results  Component Value Date   HGBA1C 6.9 (H) 04/20/2023           Assessment & Plan:     RTC in 6 months, follow-up chronic conditions Nicki Reaper, NP

## 2023-08-03 NOTE — Assessment & Plan Note (Signed)
Encouraged diet and exercise for weight loss ?

## 2023-08-03 NOTE — Assessment & Plan Note (Signed)
A1c and urine microalbumin today Encouraged her to consume a low-carb diet and exercise for weight loss Continue glipizide and Mounjaro, will increase Mounjaro to 10 mg weekly Referral to ophthalmology for eye exam Encouraged routine foot exam She declines immunizations

## 2023-08-03 NOTE — Assessment & Plan Note (Signed)
TSH and free T4 today We will adjust levothyroxine if needed based on labs

## 2023-08-03 NOTE — Assessment & Plan Note (Signed)
Continue hydroxyzine as needed She declines additional sleep aid medication at this time due to concern for increase of vivid dreams and nightmares

## 2023-08-03 NOTE — Assessment & Plan Note (Signed)
Controlled on losartan Reinforced DASH diet and exercise for weight loss C-Met today

## 2023-08-16 ENCOUNTER — Other Ambulatory Visit: Payer: Self-pay | Admitting: Internal Medicine

## 2023-08-17 NOTE — Telephone Encounter (Signed)
Unable to refill per protocol, Rx expired. Discontinued 04/21/23.  Requested Prescriptions  Pending Prescriptions Disp Refills   hydrOXYzine (VISTARIL) 25 MG capsule [Pharmacy Med Name: hydrOXYzine Pamoate 25 MG Oral Capsule] 270 capsule 0    Sig: TAKE 1 CAPSULE BY MOUTH EVERY 8 HOURS AS NEEDED     Ear, Nose, and Throat:  Antihistamines 2 Passed - 08/16/2023  6:32 AM      Passed - Cr in normal range and within 360 days    Creat  Date Value Ref Range Status  11/03/2022 0.73 0.50 - 1.03 mg/dL Final   Creatinine, Ser  Date Value Ref Range Status  04/20/2023 0.72 0.57 - 1.00 mg/dL Final   Creatinine, Urine  Date Value Ref Range Status  11/03/2022 45 20 - 275 mg/dL Final         Passed - Valid encounter within last 12 months    Recent Outpatient Visits           2 weeks ago Centrilobular emphysema (HCC)   [place] [facility] [place], Salvadore Oxford, NP   3 months ago Encounter for general adult medical examination with abnormal findings   [place] [facility] [place], Salvadore Oxford, NP   9 months ago Acquired hypothyroidism   [place] [facility] [place], Salvadore Oxford, NP   1 year ago Screening for colon cancer   [place] [facility] [place], Salvadore Oxford, NP   1 year ago Encounter for general adult medical examination with abnormal findings   [place] [facility] [place], Salvadore Oxford, NP       Future Appointments             In 5 months Baity, Salvadore Oxford, NP [place], [facility]

## 2023-08-25 ENCOUNTER — Other Ambulatory Visit: Payer: Self-pay | Admitting: Internal Medicine

## 2023-08-26 NOTE — Telephone Encounter (Signed)
Requested Prescriptions  Pending Prescriptions Disp Refills   glipiZIDE (GLUCOTROL) 10 MG tablet [Pharmacy Med Name: glipiZIDE 10 MG Oral Tablet] 180 tablet 0    Sig: TAKE 1 TABLET BY MOUTH TWICE DAILY BEFORE A MEAL     Endocrinology:  Diabetes - Sulfonylureas Passed - 08/25/2023 10:35 AM      Passed - HBA1C is between 0 and 7.9 and within 180 days    Hgb A1c MFr Bld  Date Value Ref Range Status  04/20/2023 6.9 (H) 4.8 - 5.6 % Final    Comment:             Prediabetes: 5.7 - 6.4          Diabetes: >6.4          Glycemic control for adults with diabetes: <7.0          Passed - Cr in normal range and within 360 days    Creat  Date Value Ref Range Status  11/03/2022 0.73 0.50 - 1.03 mg/dL Final   Creatinine, Ser  Date Value Ref Range Status  04/20/2023 0.72 0.57 - 1.00 mg/dL Final   Creatinine, Urine  Date Value Ref Range Status  11/03/2022 45 20 - 275 mg/dL Final         Passed - Valid encounter within last 6 months    Recent Outpatient Visits           3 weeks ago Centrilobular emphysema (HCC)   [place] [facility] [place], Salvadore Oxford, NP   4 months ago Encounter for general adult medical examination with abnormal findings   [place] [facility] [place], Salvadore Oxford, NP   9 months ago Acquired hypothyroidism   [place] [facility] [place], [place], NP   1 year ago Screening for colon cancer   [place] [facility] [place], Salvadore Oxford, NP   1 year ago Encounter for general adult medical examination with abnormal findings   [place] [facility] [place], Salvadore Oxford, NP       Future Appointments             In 5 months Baity, Salvadore Oxford, NP [place] [facility]            Refused Prescriptions Disp Refills   sertraline (ZOLOFT) 100 MG tablet [Pharmacy Med Name: Sertraline HCl 100 MG Oral Tablet] 90 tablet 0    Sig: Take 1 tablet by mouth once daily      Psychiatry:  Antidepressants - SSRI - sertraline Failed - 08/25/2023 10:35 AM      Failed - ALT in normal range and within 360 days    ALT  Date Value Ref Range Status  04/20/2023 35 (H) 0 - 32 IU/L Final         Passed - AST in normal range and within 360 days    AST  Date Value Ref Range Status  04/20/2023 26 0 - 40 IU/L Final         Passed - Completed PHQ-2 or PHQ-9 in the last 360 days      Passed - Valid encounter within last 6 months    Recent Outpatient Visits           3 weeks ago Centrilobular emphysema [facility])   [place] [facility] [place], [place], NP   4 months ago Encounter for general adult medical examination with abnormal findings  [place] [facility] [place], Salvadore Oxford, NP   9 months ago Acquired hypothyroidism   [place] [facility] [place], Salvadore Oxford, NP   1 year ago Screening for colon cancer   [place] [facility] [place], Salvadore Oxford, NP   1 year ago Encounter for general adult medical examination with abnormal findings   [place] [facility] [place], Salvadore Oxford, NP       Future Appointments             In 5 months Baity, Salvadore Oxford, NP [place] [facility], [facility]

## 2023-12-16 IMAGING — US US THYROID
1 series · 13 of 25 positions shown · non-contrast
Comparison: None Available.

CLINICAL DATA: Hypothyroidism

EXAM:
THYROID ULTRASOUND
TECHNIQUE: Ultrasound examination of the thyroid gland and adjacent soft
tissues was performed.

[Series 1: us thyroid · 13 of 56 slices shown]
[im 1/56]
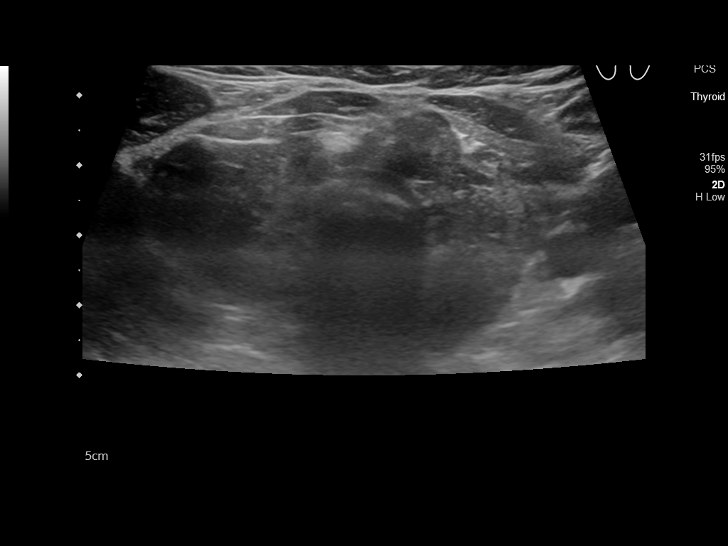
[im 5/56]
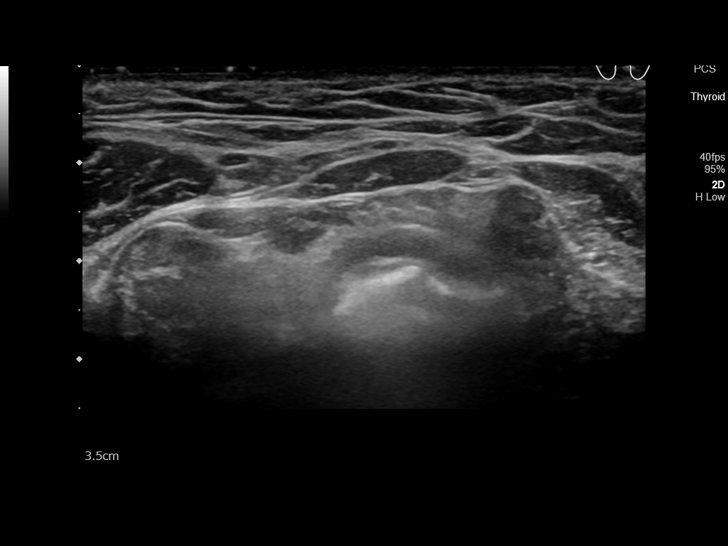
[im 10/56]
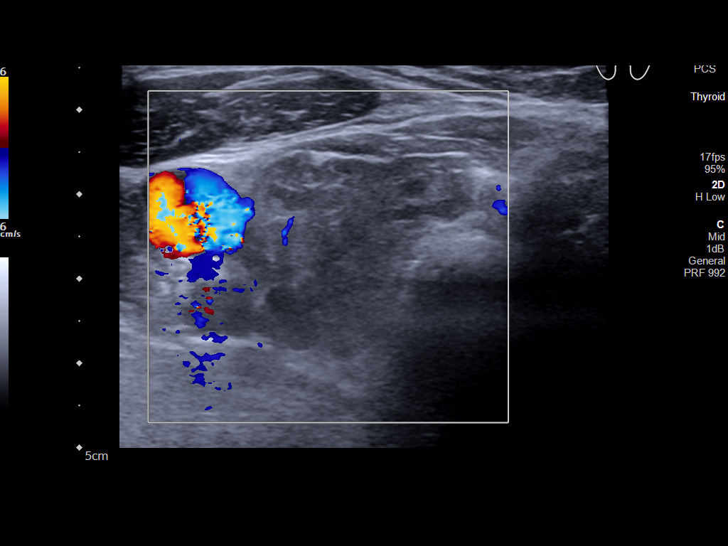
[im 14/56]
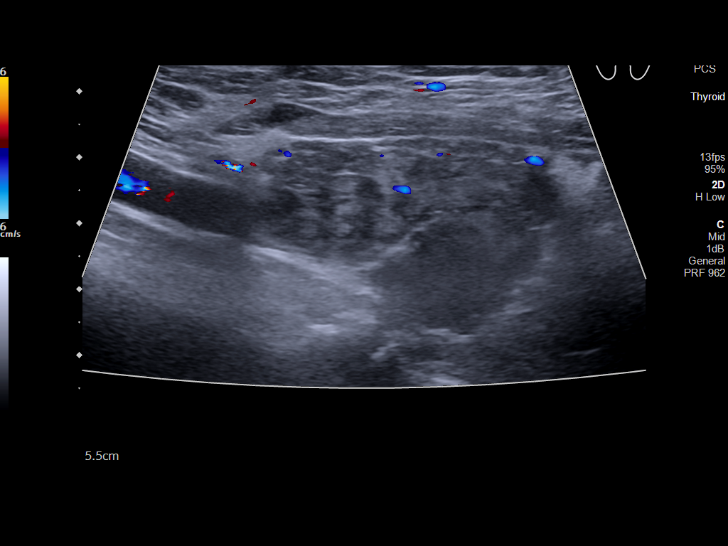
[im 19/56]
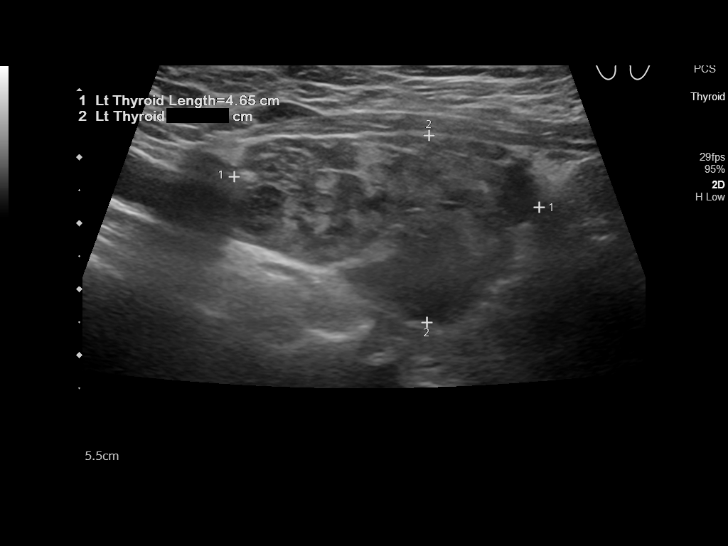
[im 23/56]
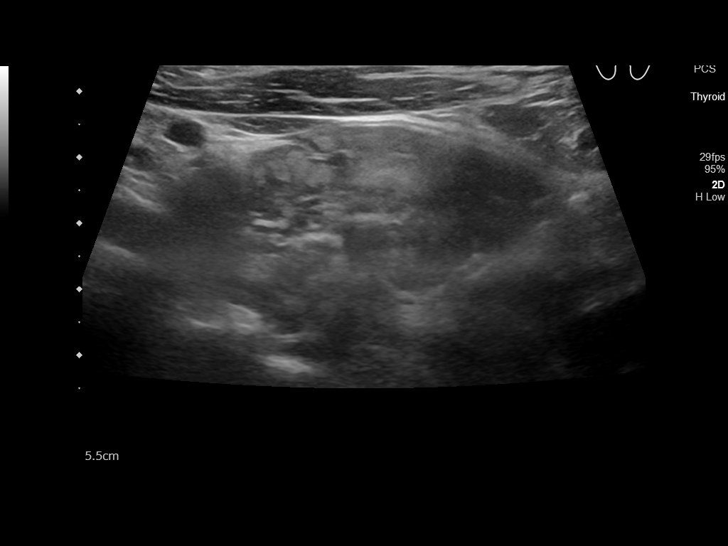
[im 28/56]
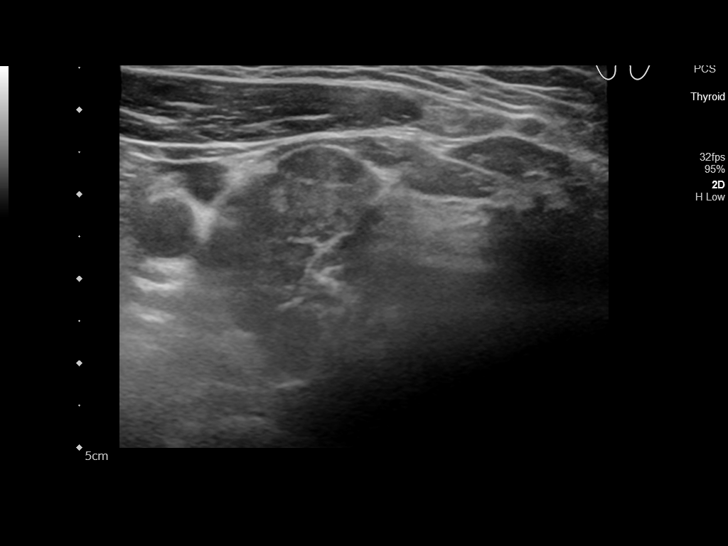
[im 33/56]
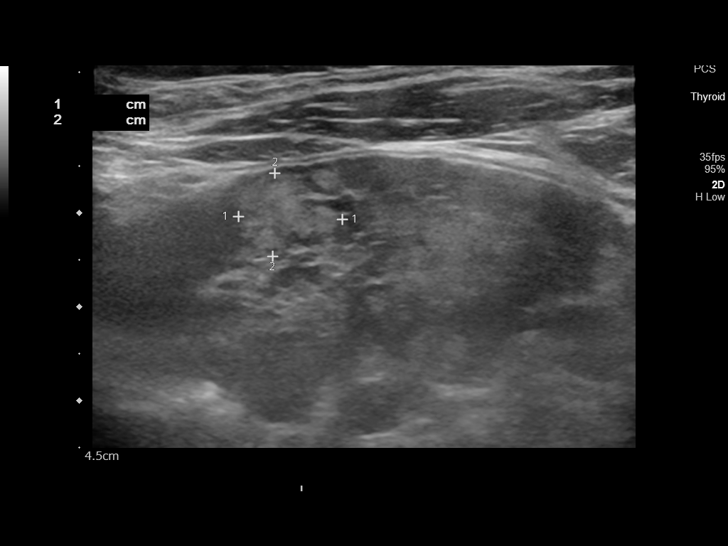
[im 37/56]
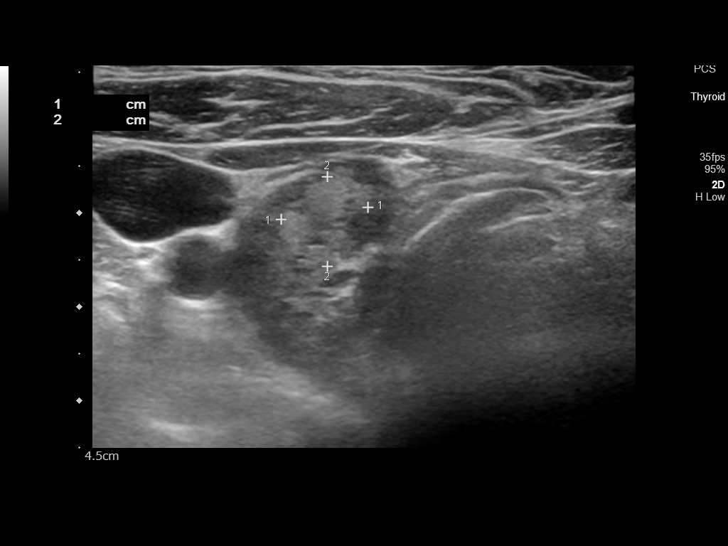
[im 42/56]
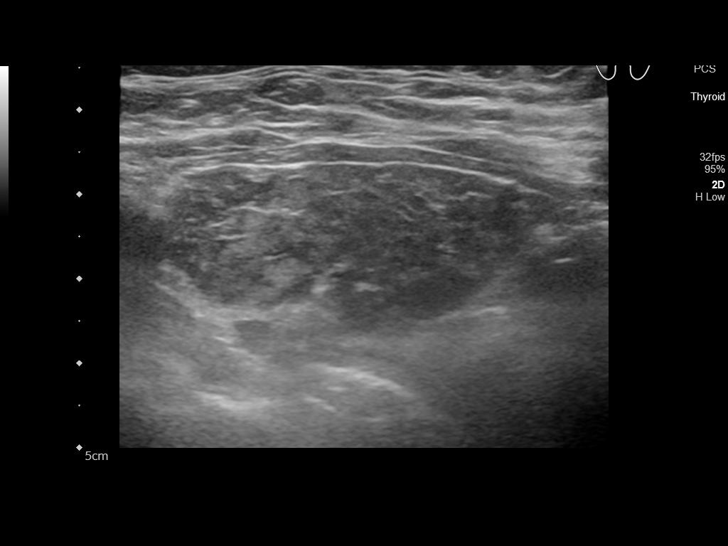
[im 46/56]
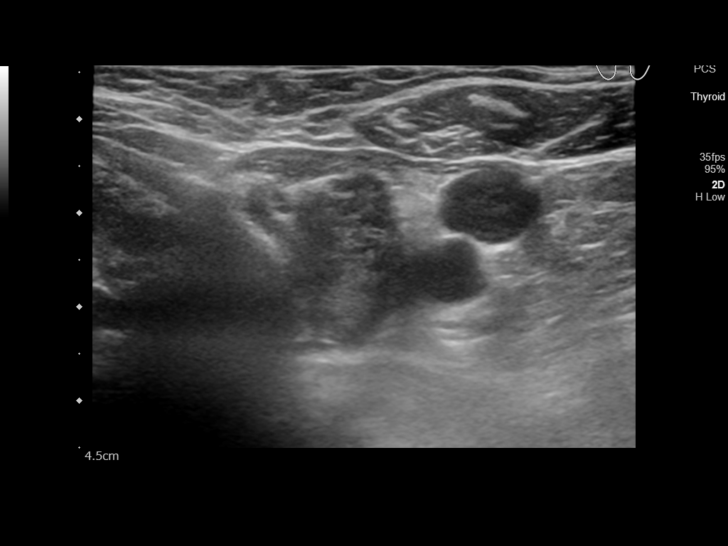
[im 51/56]
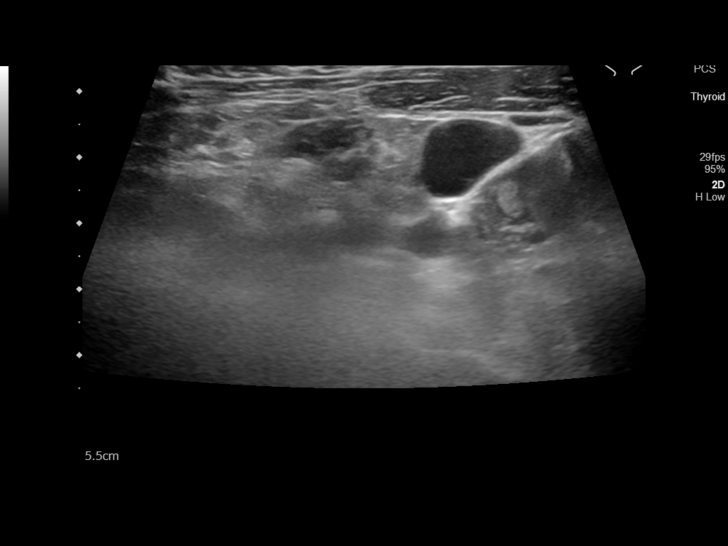
[im 56/56]
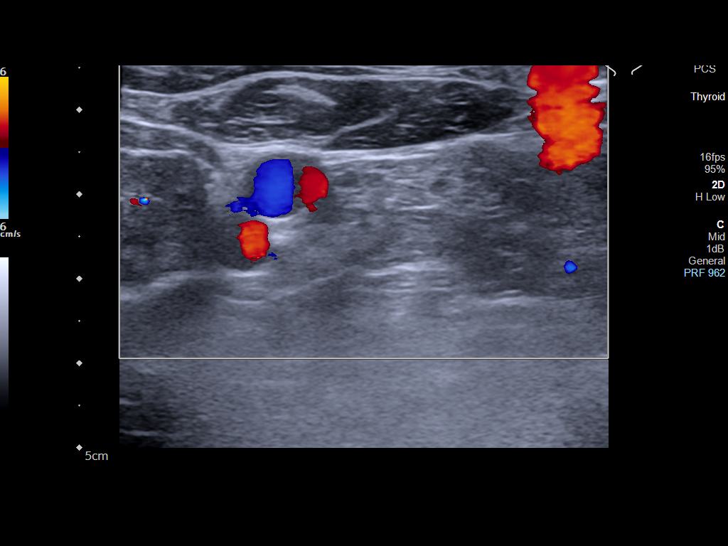

[13 of 25 positions shown; findings below may reference images not displayed]

FINDINGS: Parenchymal Echotexture: Markedly heterogenous

Isthmus: 4 mm

Right lobe: 6.0 x 3.1 x 2.3 cm

Left lobe: 4.7 x 2.8 x 1.7 cm

_________________________________________________________

Estimated total number of nodules >/= 1 cm: 1

Number of spongiform nodules >/=  2 cm not described below (TR1): 0

Number of mixed cystic and solid nodules >/= 1.5 cm not described
below (TR2): 0

_________________________________________________________

Nodule # 1:

Location: Right; Superior

Maximum size: 1.1 cm; Other 2 dimensions: 0.9 x 1.0 cm

Composition: solid/almost completely solid (2)

Echogenicity: hyperechoic (1)

Shape: not taller-than-wide (0)

Margins: ill-defined (0)

Echogenic foci: none (0)

ACR TI-RADS total points: 3.

ACR TI-RADS risk category: TR3 (3 points).

ACR TI-RADS recommendations:

Given size (<1.4 cm) and appearance, this nodule does NOT meet
TI-RADS criteria for biopsy or dedicated follow-up.

_________________________________________________________

Marked thyroid heterogeneity compatible with chronic medical thyroid
disease. No hypervascularity. Negative for regional adenopathy.
IMPRESSION: 1.1 cm right superior thyroid TR 3 nodule versus pseudo nodule. This
does not meet criteria for any follow-up or biopsy.

Marked thyroid heterogeneity consistent with chronic medical thyroid
disease.

The above is in keeping with the ACR TI-RADS recommendations - [HOSPITAL] 9259;[DATE].

## 2023-12-21 ENCOUNTER — Other Ambulatory Visit: Payer: Self-pay | Admitting: Internal Medicine

## 2023-12-22 ENCOUNTER — Other Ambulatory Visit: Payer: Self-pay

## 2023-12-22 MED ORDER — LOSARTAN POTASSIUM 100 MG PO TABS: 100.0000 mg | ORAL_TABLET | Freq: Every day | ORAL | 1 refills | Status: DC

## 2023-12-22 NOTE — Telephone Encounter (Signed)
 Losartan refilled 12/22/23 # 90 with 1 refill. Sertraline and hydroxyzine D/C. Requested Prescriptions  Refused Prescriptions Disp Refills   losartan (COZAAR) 100 MG tablet [Pharmacy Med Name: Losartan Potassium 100 MG Oral Tablet] 90 tablet 0    Sig: Take 1 tablet by mouth once daily     Cardiovascular:  Angiotensin Receptor Blockers Failed - 12/22/2023  2:11 PM      Failed - Cr in normal range and within 180 days    Creat  Date Value Ref Range Status  11/03/2022 0.73 0.50 - 1.03 mg/dL Final   Creatinine, Ser  Date Value Ref Range Status  04/20/2023 0.72 0.57 - 1.00 mg/dL Final   Creatinine, Urine  Date Value Ref Range Status  11/03/2022 45 20 - 275 mg/dL Final         Failed - K in normal range and within 180 days    Potassium  Date Value Ref Range Status  04/20/2023 4.6 3.5 - 5.2 mmol/L Final         Passed - Patient is not pregnant      Passed - Last BP in normal range    BP Readings from Last 1 Encounters:  08/03/23 122/78         Passed - Valid encounter within last 6 months    Recent Outpatient Visits           4 months ago Centrilobular emphysema (HCC)   [place] [facility] [place], Salvadore Oxford, NP   8 months ago Encounter for general adult medical examination with abnormal findings   [place] [facility] [place], Salvadore Oxford, NP   1 year ago Acquired hypothyroidism   [place] [facility] [place], Salvadore Oxford, NP   1 year ago Screening for colon cancer   [place] [facility] [place], Salvadore Oxford, NP   2 years ago Encounter for general adult medical examination with abnormal findings   [place] [facility] [place], Salvadore Oxford, NP       Future Appointments             In 1 month Baity, Salvadore Oxford, NP [place] [facility]             hydrOXYzine (VISTARIL) 25 MG capsule [Pharmacy Med Name: hydrOXYzine Pamoate 25 MG Oral Capsule] 270 capsule 0    Sig: TAKE 1  CAPSULE BY MOUTH EVERY 8 HOURS AS NEEDED     Ear, Nose, and Throat:  Antihistamines 2 Passed - 12/22/2023  2:11 PM      Passed - Cr in normal range and within 360 days    Creat  Date Value Ref Range Status  11/03/2022 0.73 0.50 - 1.03 mg/dL Final   Creatinine, Ser  Date Value Ref Range Status  04/20/2023 0.72 0.57 - 1.00 mg/dL Final   Creatinine, Urine  Date Value Ref Range Status  11/03/2022 45 20 - 275 mg/dL Final         Passed - Valid encounter within last 12 months    Recent Outpatient Visits           4 months ago Centrilobular emphysema [facility])   [place] [facility] [place], Salvadore Oxford, NP   8 months ago Encounter for general adult medical examination with abnormal findings   [place] [facility] [place], Salvadore Oxford, NP   1 year ago Acquired hypothyroidism   [place]  [facility] [place], Salvadore Oxford, NP   1 year ago Screening for colon cancer   [place] [facility] [place], [place], NP   2 years ago Encounter for general adult medical examination with abnormal findings   [place] [facility] [place], Salvadore Oxford, NP       Future Appointments             In 1 month [place], Salvadore Oxford, NP [place] [facility], [facility]             sertraline (ZOLOFT) 50 MG tablet [Pharmacy Med Name: Sertraline HCl 50 MG Oral Tablet] 90 tablet 0    Sig: Take 1 tablet by mouth once daily     Psychiatry:  Antidepressants - SSRI - sertraline Failed - 12/22/2023  2:11 PM      Failed - ALT in normal range and within 360 days    ALT  Date Value Ref Range Status  04/20/2023 35 (H) 0 - 32 IU/L Final         Passed - AST in normal range and within 360 days    AST  Date Value Ref Range Status  04/20/2023 26 0 - 40 IU/L Final         Passed - Completed PHQ-2 or PHQ-9 in the last 360 days      Passed - Valid encounter within last 6 months    Recent Outpatient Visits           4 months ago  Centrilobular emphysema [facility])   [place] [facility] [place], Salvadore Oxford, NP   8 months ago Encounter for general adult medical examination with abnormal findings   [place] [facility] [place], Salvadore Oxford, NP   1 year ago Acquired hypothyroidism   [place] [facility] [place], Salvadore Oxford, NP   1 year ago Screening for colon cancer   [place] [facility] [place], Salvadore Oxford, NP   2 years ago Encounter for general adult medical examination with abnormal findings   [facility] [place], Salvadore Oxford, NP       Future Appointments             In 1 month Baity, Salvadore Oxford, NP [place] [facility], [facility]

## 2023-12-24 ENCOUNTER — Other Ambulatory Visit: Payer: Self-pay | Admitting: Internal Medicine

## 2023-12-24 NOTE — Telephone Encounter (Signed)
 Requested Prescriptions  Pending Prescriptions Disp Refills   sertraline (ZOLOFT) 100 MG tablet [Pharmacy Med Name: Sertraline HCl 100 MG Oral Tablet] 90 tablet 0    Sig: Take 1 tablet by mouth once daily     Psychiatry:  Antidepressants - SSRI - sertraline Failed - 12/24/2023  5:02 PM      Failed - ALT in normal range and within 360 days    ALT  Date Value Ref Range Status  04/20/2023 35 (H) 0 - 32 IU/L Final         Passed - AST in normal range and within 360 days    AST  Date Value Ref Range Status  04/20/2023 26 0 - 40 IU/L Final         Passed - Completed PHQ-2 or PHQ-9 in the last 360 days      Passed - Valid encounter within last 6 months    Recent Outpatient Visits           4 months ago Centrilobular emphysema (HCC)   [place] [facility] [place], Salvadore Oxford, NP   8 months ago Encounter for general adult medical examination with abnormal findings   [place] [facility] [place], Salvadore Oxford, NP   1 year ago Acquired hypothyroidism   [place] [facility] [place], [place], NP   1 year ago Screening for colon cancer   [place] [facility] [place], [place], NP   2 years ago Encounter for general adult medical examination with abnormal findings   [place] [facility] [place], Salvadore Oxford, NP       Future Appointments             In 1 month [place], Salvadore Oxford, NP [place] [facility]            Refused Prescriptions Disp Refills   sertraline (ZOLOFT) 50 MG tablet [Pharmacy Med Name: Sertraline HCl 50 MG Oral Tablet] 90 tablet 0    Sig: Take 1 tablet by mouth once daily     Psychiatry:  Antidepressants - SSRI - sertraline Failed - 12/24/2023  5:02 PM      Failed - ALT in normal range and within 360 days    ALT  Date Value Ref Range Status  04/20/2023 35 (H) 0 - 32 IU/L Final         Passed - AST in normal range and within 360 days    AST  Date Value Ref Range  Status  04/20/2023 26 0 - 40 IU/L Final         Passed - Completed PHQ-2 or PHQ-9 in the last 360 days      Passed - Valid encounter within last 6 months    Recent Outpatient Visits           4 months ago Centrilobular emphysema [facility])   [place] [facility] [place], Salvadore Oxford, NP   8 months ago Encounter for general adult medical examination with abnormal findings   [place] [facility] [place], Salvadore Oxford, NP   1 year ago Acquired hypothyroidism   [place] [facility] [place], Salvadore Oxford, NP   1 year ago Screening for colon cancer    [facility] [place], Salvadore Oxford, NP   2 years ago Encounter for general adult medical examination with abnormal findings   [facility]  [facility] [place], Salvadore Oxford, NP       Future Appointments             In 1 month [place], Salvadore Oxford, NP [place] [facility], [place]

## 2024-01-18 ENCOUNTER — Other Ambulatory Visit: Payer: Self-pay | Admitting: Internal Medicine

## 2024-01-20 NOTE — Telephone Encounter (Signed)
 Requested medications are due for refill today.  yes  Requested medications are on the active medications list.  yes  Last refill. 04/21/2023 #180 1 rf  Future visit scheduled.   yes  Notes to clinic.  Labs are expired.    Requested Prescriptions  Pending Prescriptions Disp Refills   hydrOXYzine (VISTARIL) 50 MG capsule [Pharmacy Med Name: hydrOXYzine Pamoate 50 MG Oral Capsule] 180 capsule 0    Sig: Take 1 capsule by mouth twice daily     Ear, Nose, and Throat:  Antihistamines 2 Failed - 01/20/2024  9:51 AM      Failed - Valid encounter within last 12 months    Recent Outpatient Visits   None     Future Appointments             In 2 weeks [place], Salvadore Oxford, NP [place] [facility]            Passed - Cr in normal range and within 360 days    Creat  Date Value Ref Range Status  11/03/2022 0.73 0.50 - 1.03 mg/dL Final   Creatinine, Ser  Date Value Ref Range Status  04/20/2023 0.72 0.57 - 1.00 mg/dL Final   Creatinine, Urine  Date Value Ref Range Status  11/03/2022 45 20 - 275 mg/dL Final

## 2024-01-25 DIAGNOSIS — E119 Type 2 diabetes mellitus without complications: Secondary | ICD-10-CM | POA: Diagnosis not present

## 2024-01-25 LAB — HM DIABETES EYE EXAM

## 2024-01-26 DIAGNOSIS — H5213 Myopia, bilateral: Secondary | ICD-10-CM | POA: Diagnosis not present

## 2024-02-08 ENCOUNTER — Ambulatory Visit: Payer: Self-pay | Admitting: Internal Medicine

## 2024-02-08 ENCOUNTER — Encounter: Payer: Self-pay | Admitting: Internal Medicine

## 2024-02-08 VITALS — BP 128/74 | Ht 68.75 in | Wt 263.0 lb

## 2024-02-08 DIAGNOSIS — F32A Depression, unspecified: Secondary | ICD-10-CM

## 2024-02-08 DIAGNOSIS — J432 Centrilobular emphysema: Secondary | ICD-10-CM

## 2024-02-08 DIAGNOSIS — E119 Type 2 diabetes mellitus without complications: Secondary | ICD-10-CM

## 2024-02-08 DIAGNOSIS — E039 Hypothyroidism, unspecified: Secondary | ICD-10-CM

## 2024-02-08 DIAGNOSIS — E785 Hyperlipidemia, unspecified: Secondary | ICD-10-CM

## 2024-02-08 DIAGNOSIS — E1169 Type 2 diabetes mellitus with other specified complication: Secondary | ICD-10-CM

## 2024-02-08 DIAGNOSIS — I10 Essential (primary) hypertension: Secondary | ICD-10-CM

## 2024-02-08 DIAGNOSIS — E66812 Obesity, class 2: Secondary | ICD-10-CM

## 2024-02-08 DIAGNOSIS — F5101 Primary insomnia: Secondary | ICD-10-CM

## 2024-02-08 DIAGNOSIS — D751 Secondary polycythemia: Secondary | ICD-10-CM | POA: Diagnosis not present

## 2024-02-08 DIAGNOSIS — F419 Anxiety disorder, unspecified: Secondary | ICD-10-CM

## 2024-02-08 DIAGNOSIS — Z7984 Long term (current) use of oral hypoglycemic drugs: Secondary | ICD-10-CM | POA: Diagnosis not present

## 2024-02-08 DIAGNOSIS — M150 Primary generalized (osteo)arthritis: Secondary | ICD-10-CM | POA: Diagnosis not present

## 2024-02-08 DIAGNOSIS — E1165 Type 2 diabetes mellitus with hyperglycemia: Secondary | ICD-10-CM

## 2024-02-08 MED ORDER — HYDROXYZINE PAMOATE 100 MG PO CAPS: ORAL_CAPSULE | ORAL | 0 refills | Status: DC

## 2024-02-08 MED ORDER — METHOCARBAMOL 500 MG PO TABS: 500.0000 mg | ORAL_TABLET | Freq: Three times a day (TID) | ORAL | 0 refills | Status: DC | PRN

## 2024-02-08 NOTE — Assessment & Plan Note (Signed)
 C-Met and lipid profile today Encouraged her to consume a low-fat diet Consider restarting statin therapy if needed based on labs

## 2024-02-08 NOTE — Assessment & Plan Note (Signed)
 Encourage weight loss as this cannot reduce joint pain Continue ibuprofen  OTC but avoid overuse Rx for methocarbamol  500 mg every 8 hours as needed

## 2024-02-08 NOTE — Assessment & Plan Note (Signed)
 TSH and free T4 today We will adjust levothyroxine if needed based on labs

## 2024-02-08 NOTE — Assessment & Plan Note (Signed)
 Encouraged diet and exercise for weight loss ?

## 2024-02-08 NOTE — Assessment & Plan Note (Signed)
Controlled on losartan Reinforced DASH diet and exercise for weight loss C-Met today

## 2024-02-08 NOTE — Progress Notes (Signed)
 Subjective:    Patient ID: Donna Whitaker, female    DOB: July 18, 1971, 53 y.o.   MRN: [id number]  HPI  Patient presents to clinic today for 62-month follow-up of chronic conditions.  Hypothyroidism: She denies any issues on her current dose of levothyroxine .  She does not follow with endocrinology.  Insomnia: She has difficulty staying asleep.  She takes hydroxyzine  as needed with some relief of symptoms but reports it causes vivid dreams.  There is no sleep study on file.  Anxiety and depression: Chronic, managed on sertraline  and hydroxyzine .  She is not currently seeing a therapist.  She denies SI/HI.  OA: Mainly in her back and knees but she does feel like her back pain has been worse lately.  She is takes ibuprofen  as needed but has been prescribed celebrex  in the past.  She does not follow with orthopedics.  COPD: She denies cough and shortness of breath.  She is not using breztri  but uses albuterol  only as needed. There are no PFTs on file.  She does continue to smoke.  She does not follow with pulmonology.  HTN: Her BP today is 128/74.  She is taking losartan  as prescribed.  ECG from 07/2020 reviewed.  DM2: Her last A1c was 6.9%, 04/2023.  She is taking glipizide  but she reports she stopped mounjaro  about 2 months ago. She does not check her sugars routinely.  She does not check her feet routinely.  Her last eye exam was 01/2024.  Flu never.  Pneumovax never.  COVID never.  HLD: Her last LDL was 95, triglycerides 409, 04/2023.  She is not currently taking any cholesterol-lowering medication at this time but has been on simvastatin  in the past which she stopped due to GI upset.  She does not consume a low-fat diet.  Polycythemia: Her last H/H was 15.8/47.9, 04/2023.  She does smoke.  She does not follow with hematology.  Review of Systems     Past Medical History:  Diagnosis Date   Allergy    Anxiety    Arthritis    knees, back   COPD (chronic obstructive pulmonary disease)  (HCC)    uses albuterol  as needed   Depression    Diabetes mellitus without complication (HCC)    Heavy smoker    2ppd   History of chicken pox    Hypertension    Hypothyroidism    Intraductal papilloma of breast, left     Current Outpatient Medications  Medication Sig Dispense Refill   Budeson-Glycopyrrol-Formoterol (BREZTRI  AEROSPHERE) 160-9-4.8 MCG/ACT AERO Inhale 2 puffs into the lungs 2 (two) times daily. 10.7 g 11   Cholecalciferol (EQL VITAMIN D3) 50 MCG (2000 UT) CAPS Take by mouth.     fexofenadine (ALLEGRA) 180 MG tablet Take 180 mg by mouth daily.     glipiZIDE  (GLUCOTROL ) 10 MG tablet TAKE 1 TABLET BY MOUTH TWICE DAILY BEFORE A MEAL 180 tablet 0   hydrOXYzine  (VISTARIL ) 50 MG capsule Take 1 capsule by mouth twice daily 180 capsule 0   Lancets (ONETOUCH ULTRASOFT) lancets 1 each by Other route in the morning and at bedtime. 200 each 1   levothyroxine  (SYNTHROID ) 125 MCG tablet Take 1 tablet by mouth once daily 90 tablet 0   losartan  (COZAAR ) 100 MG tablet Take 1 tablet (100 mg total) by mouth daily. 90 tablet 1   olopatadine  (PATADAY ) 0.1 % ophthalmic solution Place 1 drop into the left eye daily. 5 mL 0   ondansetron  (ZOFRAN -ODT) 4 MG disintegrating tablet  Take 1 tablet (4 mg total) by mouth every 8 (eight) hours as needed for nausea or vomiting. 20 tablet 0   ONETOUCH ULTRA test strip 1 EACH BY OTHER ROUTE IN THE MORNING AND AT BEDTIME. 200 strip 1   sertraline  (ZOLOFT ) 100 MG tablet Take 1 tablet by mouth once daily 90 tablet 0   tirzepatide  (MOUNJARO ) 10 MG/0.5ML Pen Inject 10 mg into the skin once a week. 6 mL 1   No current facility-administered medications for this visit.    Allergies  Allergen Reactions   Betadine [Povidone Iodine] Other (See Comments)    Skin irritation   Vicodin [Hydrocodone-Acetaminophen ] Palpitations    Chest pain   Hydrocodone-Acetaminophen     Iodine     Family History  Problem Relation Age of Onset   Arthritis Mother     Hyperlipidemia Mother    Alcohol abuse Father    Hyperlipidemia Father    Heart disease Father    Hypertension Father    Arthritis Maternal Grandmother    Hyperlipidemia Maternal Grandmother    Heart disease Maternal Grandmother    Hypertension Maternal Grandmother    Diabetes Maternal Grandmother    Hyperlipidemia Maternal Grandfather    Hypertension Maternal Grandfather    Hyperlipidemia Paternal Grandmother    Hypertension Paternal Grandmother    Stomach cancer Paternal Grandmother    Alcohol abuse Paternal Grandfather    Hyperlipidemia Paternal Grandfather    Heart disease Paternal Grandfather    Hypertension Paternal Grandfather     Social History   Socioeconomic History   Marital status: Married    Spouse name: Not on file   Number of children: Not on file   Years of education: Not on file   Highest education level: GED or equivalent  Occupational History   Not on file  Tobacco Use   Smoking status: Every Day    Current packs/day: 2.00    Average packs/day: 2.0 packs/day for 35.0 years (70.0 ttl pk-yrs)    Types: Cigarettes   Smokeless tobacco: Never  Vaping Use   Vaping status: Never Used  Substance and Sexual Activity   Alcohol use: Yes    Comment: occasional   Drug use: No   Sexual activity: Yes    Birth control/protection: Post-menopausal, Surgical    Comment: BTL  Other Topics Concern   Not on file  Social History Narrative   ** Merged History Encounter **       Social Drivers of Health   Financial Resource Strain: Medium Risk (08/03/2023)   Overall Financial Resource Strain (CARDIA)    Difficulty of Paying Living Expenses: Somewhat hard  Food Insecurity: Food Insecurity Present (08/03/2023)   Hunger Vital Sign    Worried About Running Out of Food in the Last Year: Sometimes true    Ran Out of Food in the Last Year: Sometimes true  Transportation Needs: No Transportation Needs (08/03/2023)   PRAPARE - Administrator, Civil Service  (Medical): No    Lack of Transportation (Non-Medical): No  Physical Activity: Unknown (08/03/2023)   Exercise Vital Sign    Days of Exercise per Week: 0 days    Minutes of Exercise per Session: Not on file  Stress: Stress Concern Present (08/03/2023)   [employer] of Occupational Health - Occupational Stress Questionnaire    Feeling of Stress : To some extent  Social Connections: Unknown (08/03/2023)   Social Connection and Isolation Panel [NHANES]    Frequency of Communication with Friends and Family: Once  a week    Frequency of Social Gatherings with Friends and Family: Patient declined    Attends Religious Services: Patient declined    Database administrator or Organizations: No    Attends Engineer, structural: Not on file    Marital Status: Married  Catering manager Violence: Not on file     Constitutional: Denies fever, malaise, fatigue, headache or abrupt weight changes.  HEENT: Denies eye pain, eye redness, ear pain, ringing in the ears, wax buildup, runny nose, nasal congestion, bloody nose, or sore throat. Respiratory:  Denies cough, difficulty breathing, shortness of breath, or sputum production.   Cardiovascular: Denies chest pain, chest tightness, palpitations or swelling in the hands or feet.  Gastrointestinal: Denies abdominal pain, bloating, constipation, diarrhea or blood in the stool.  GU: Denies urgency, frequency, pain with urination, burning sensation, blood in urine, odor or discharge. Musculoskeletal: Patient reports joint pain, worsening back pain.  Denies decrease in range of motion, difficulty with gait, muscle pain or joint swelling.  Skin: Denies redness, rashes, lesions or ulcercations.  Neurological: Patient reports insomnia.  Denies dizziness, difficulty with memory, difficulty with speech or problems with balance and coordination.  Psych: Patient has a history of anxiety and depression.  Denies SI/HI.  No other specific complaints in a  complete review of systems (except as listed in HPI above).  Objective:   Physical Exam  BP 128/74 (BP Location: Right Arm, Patient Position: Sitting, Cuff Size: Normal)   Ht 5' 8.75" (1.746 m)   Wt 263 lb (119.3 kg)   LMP 05/19/2016 Comment: BTL  BMI 39.12 kg/m    Wt Readings from Last 3 Encounters:  08/03/23 264 lb (119.7 kg)  04/20/23 286 lb (129.7 kg)  11/03/22 (!) 321 lb (145.6 kg)    General: Appears her stated age, obese, in NAD. Skin: Warm, dry and intact. No ulcerations noted. HEENT: Head: normal shape and size; Eyes: sclera white, no icterus, conjunctiva pink, PERRLA and EOMs intact;  Neck:  Neck supple, trachea midline. No masses, lumps or thyromegaly present.  Cardiovascular: Normal rate and rhythm. S1,S2 noted.  No murmur, rubs or gallops noted. No JVD or BLE edema. No carotid bruits noted. Pulmonary/Chest: Normal effort and diminished breath sounds. No respiratory distress. No wheezes, rales or ronchi noted.  Musculoskeletal: She has difficulty getting from a sitting to a standing position.  Pain with palpation along the right SI joint.  Gait slow and steady without device. Neurological: Alert and oriented. Coordination normal.  Psychiatric: Mood and affect mildly flat. Behavior is normal. Judgment and thought content normal.    BMET    Component Value Date/Time   NA 139 04/20/2023 1700   K 4.6 04/20/2023 1700   CL 98 04/20/2023 1700   CO2 24 04/20/2023 1700   GLUCOSE 253 (H) 04/20/2023 1700   GLUCOSE 203 (H) 11/03/2022 1129   BUN 9 04/20/2023 1700   CREATININE 0.72 04/20/2023 1700   CREATININE 0.73 11/03/2022 1129   CALCIUM  9.6 04/20/2023 1700   GFRNONAA >60 08/05/2020 0820    Lipid Panel     Component Value Date/Time   CHOL 170 04/20/2023 1700   TRIG 182 (H) 04/20/2023 1700   HDL 44 04/20/2023 1700   CHOLHDL 3.9 04/20/2023 1700   CHOLHDL 4.0 11/03/2022 1129   VLDL 20.6 04/30/2020 0921   LDLCALC 95 04/20/2023 1700   LDLCALC 102 (H) 11/03/2022  1129    CBC    Component Value Date/Time   WBC 14.9 (H)  04/20/2023 1700   WBC 7.3 11/03/2022 1129   RBC 5.36 (H) 04/20/2023 1700   RBC 5.15 (H) 11/03/2022 1129   HGB 15.8 04/20/2023 1700   HCT 47.9 (H) 04/20/2023 1700   PLT 294 04/20/2023 1700   MCV 89 04/20/2023 1700   MCH 29.5 04/20/2023 1700   MCH 30.1 11/03/2022 1129   MCHC 33.0 04/20/2023 1700   MCHC 33.6 11/03/2022 1129   RDW 12.4 04/20/2023 1700    Hgb A1C Lab Results  Component Value Date   HGBA1C 6.9 (H) 04/20/2023           Assessment & Plan:     RTC in 3 months for your annual exam Helayne Lo, NP

## 2024-02-08 NOTE — Assessment & Plan Note (Signed)
 CBC today Encouraged smoking cessation however she does not want this at this time

## 2024-02-08 NOTE — Assessment & Plan Note (Signed)
Continue sertraline and hydroxyzine Support offered

## 2024-02-08 NOTE — Patient Instructions (Signed)
 Smoking Cessation You will learn methods to help you quit smoking and how quitting can help prevent a variety of health problems and improve your health. To view the content, go to this web address: [email or web address].elsevier.[email or web address]  This video will expire on: 09/29/2025. If you need access to this video following this date, please reach out to the healthcare provider who assigned it to you. This information is not intended to replace advice given to you by your health care provider. Make sure you discuss any questions you have with your health care provider. Elsevier Patient Education  2024 [employer].

## 2024-02-08 NOTE — Assessment & Plan Note (Signed)
 Will increase hydroxyzine  to 100 mg to see if this is more effective We will monitor

## 2024-02-08 NOTE — Assessment & Plan Note (Signed)
 Encourage smoking cessation, she declines help with this at this time Breztri  discontinued from list due to nonuse Continue albuterol  as needed

## 2024-02-08 NOTE — Assessment & Plan Note (Signed)
 A1c and urine microalbumin today Encouraged her to consume a low-carb diet and exercise for weight loss Continue glipizide  Mounjaro  discontinued due to nonuse Encouraged routine eye exam Encouraged routine foot exam She declines immunizations

## 2024-02-09 ENCOUNTER — Encounter: Payer: Self-pay | Admitting: Internal Medicine

## 2024-02-09 LAB — LIPID PANEL
Cholesterol: 225 mg/dL — ABNORMAL HIGH (ref <200)
HDL: 58 mg/dL (ref >=50)
LDL Cholesterol (Calc): 144 mg/dL — ABNORMAL HIGH
Non-HDL Cholesterol (Calc): 167 mg/dL — ABNORMAL HIGH (ref <130)
Total CHOL/HDL Ratio: 3.9 (calc) (ref <5.0)
Triglycerides: 116 mg/dL (ref <150)

## 2024-02-09 LAB — COMPREHENSIVE METABOLIC PANEL WITH GFR
AG Ratio: 1.8 (calc) (ref 1.0–2.5)
ALT: 17 U/L (ref 6–29)
AST: 14 U/L (ref 10–35)
Albumin: 4.3 g/dL (ref 3.6–5.1)
Alkaline phosphatase (APISO): 104 U/L (ref 37–153)
BUN: 11 mg/dL (ref 7–25)
CO2: 31 mmol/L (ref 20–32)
Calcium: 9.7 mg/dL (ref 8.6–10.4)
Chloride: 100 mmol/L (ref 98–110)
Creat: 0.67 mg/dL (ref 0.50–1.03)
Globulin: 2.4 g/dL (ref 1.9–3.7)
Glucose, Bld: 143 mg/dL — ABNORMAL HIGH (ref 65–99)
Potassium: 4.3 mmol/L (ref 3.5–5.3)
Sodium: 139 mmol/L (ref 135–146)
Total Bilirubin: 0.6 mg/dL (ref 0.2–1.2)
Total Protein: 6.7 g/dL (ref 6.1–8.1)
eGFR: 103 mL/min/{1.73_m2} (ref >=60)

## 2024-02-09 LAB — MICROALBUMIN / CREATININE URINE RATIO
Creatinine, Urine: 69 mg/dL (ref 20–275)
Microalb, Ur: <0.2 mg/dL

## 2024-02-09 LAB — CBC
HCT: 45.3 % — ABNORMAL HIGH (ref 35.0–45.0)
Hemoglobin: 15.1 g/dL (ref 11.7–15.5)
MCH: 30 pg (ref 27.0–33.0)
MCHC: 33.3 g/dL (ref 32.0–36.0)
MCV: 89.9 fL (ref 80.0–100.0)
MPV: 9.5 fL (ref 7.5–12.5)
Platelets: 288 10*3/uL (ref 140–400)
RBC: 5.04 10*6/uL (ref 3.80–5.10)
RDW: 13.1 % (ref 11.0–15.0)
WBC: 9.7 10*3/uL (ref 3.8–10.8)

## 2024-02-09 LAB — T4, FREE: Free T4: 1.2 ng/dL (ref 0.8–1.8)

## 2024-02-09 LAB — TSH: TSH: 5.92 m[IU]/L — ABNORMAL HIGH

## 2024-02-09 LAB — HEMOGLOBIN A1C
Hgb A1c MFr Bld: 6.6 % — ABNORMAL HIGH (ref <5.7)
Mean Plasma Glucose: 143 mg/dL
eAG (mmol/L): 7.9 mmol/L

## 2024-02-09 MED ORDER — ATORVASTATIN CALCIUM 10 MG PO TABS: 10.0000 mg | ORAL_TABLET | Freq: Every day | ORAL | 1 refills | Status: DC

## 2024-03-08 ENCOUNTER — Other Ambulatory Visit: Payer: Self-pay | Admitting: Internal Medicine

## 2024-03-09 NOTE — Telephone Encounter (Signed)
 Requested medications are due for refill today.  yes  Requested medications are on the active medications list.  yes  Last refill. 07/20/2023 #90 0 rf  Future visit scheduled.   yes  Notes to clinic.  Abnormal labs.    Requested Prescriptions  Pending Prescriptions Disp Refills   levothyroxine  (SYNTHROID ) 125 MCG tablet [Pharmacy Med Name: Levothyroxine  Sodium 125 MCG Oral Tablet] 90 tablet 0    Sig: Take 1 tablet by mouth once daily     Endocrinology:  Hypothyroid Agents Failed - 03/09/2024  4:43 PM      Failed - TSH in normal range and within 360 days    TSH  Date Value Ref Range Status  02/08/2024 5.92 (H) mIU/L Final    Comment:              Reference Range .           > or = 20 Years  0.40-4.50 .                Pregnancy Ranges           First trimester    0.26-2.66           Second trimester   0.55-2.73           Third trimester    0.43-2.91          Passed - Valid encounter within last 12 months    Recent Outpatient Visits           1 month ago Type 2 diabetes mellitus with hyperglycemia, without long-term current use of insulin [facility])    [facility] [place], [place], [place]

## 2024-03-10 ENCOUNTER — Other Ambulatory Visit: Payer: Self-pay | Admitting: Internal Medicine

## 2024-03-14 ENCOUNTER — Telehealth: Payer: Self-pay

## 2024-03-14 NOTE — Telephone Encounter (Signed)
 Requested Prescriptions  Pending Prescriptions Disp Refills   hydrOXYzine  (VISTARIL ) 100 MG capsule [Pharmacy Med Name: hydrOXYzine  Pamoate 100 MG Oral Capsule] 90 capsule 0    Sig: TAKE 1/2 (ONE-HALF) CAPSULE BY MOUTH IN THE MORNING AND 1 EVERY DAY AT BEDTIME     Ear, Nose, and Throat:  Antihistamines 2 Passed - 03/14/2024  3:31 PM      Passed - Cr in normal range and within 360 days    Creat  Date Value Ref Range Status  02/08/2024 0.67 0.50 - 1.03 mg/dL Final   Creatinine, Urine  Date Value Ref Range Status  02/08/2024 69 20 - 275 mg/dL Final         Passed - Valid encounter within last 12 months    Recent Outpatient Visits           1 month ago Type 2 diabetes mellitus with hyperglycemia, without long-term current use of insulin [facility])   [place] [facility] [place], [place], [place]

## 2024-03-14 NOTE — Telephone Encounter (Signed)
 Copied from CRM [id number]. Topic: General - Other >> Mar 14, 2024  3:46 PM Donna Whitaker wrote: Reason for CRM: [facility] w/[facility] called.. [phone]  hydrOXYzine  (VISTARIL ) 100 MG capsule is a capsule so it can't be halfed per the directions:  Pharmacy wants to know if we can change the instructions.

## 2024-03-15 MED ORDER — HYDROXYZINE PAMOATE 50 MG PO CAPS: 50.0000 mg | ORAL_CAPSULE | Freq: Two times a day (BID) | ORAL | 0 refills | Status: DC

## 2024-03-15 NOTE — Telephone Encounter (Signed)
 I changed to 50 mg capsules, 1 capsule twice daily

## 2024-03-15 NOTE — Addendum Note (Signed)
 Addended by: Carollynn Cirri on: 03/15/2024 08:17 AM   Modules accepted: Orders

## 2024-03-17 ENCOUNTER — Other Ambulatory Visit: Payer: Self-pay | Admitting: Internal Medicine

## 2024-03-18 NOTE — Telephone Encounter (Signed)
 Requested Prescriptions  Pending Prescriptions Disp Refills   sertraline  (ZOLOFT ) 100 MG tablet [Pharmacy Med Name: Sertraline  HCl 100 MG Oral Tablet] 90 tablet 0    Sig: Take 1 tablet by mouth once daily     Psychiatry:  Antidepressants - SSRI - sertraline  Passed - 03/18/2024  3:47 PM      Passed - AST in normal range and within 360 days    AST  Date Value Ref Range Status  02/08/2024 14 10 - 35 U/L Final         Passed - ALT in normal range and within 360 days    ALT  Date Value Ref Range Status  02/08/2024 17 6 - 29 U/L Final         Passed - Completed PHQ-2 or PHQ-9 in the last 360 days      Passed - Valid encounter within last 6 months    Recent Outpatient Visits           1 month ago Type 2 diabetes mellitus with hyperglycemia, without long-term current use of insulin [facility])   [facility] [place], [place], [place]

## 2024-04-13 ENCOUNTER — Encounter: Payer: Self-pay | Admitting: Internal Medicine

## 2024-04-13 MED ORDER — HYDROXYZINE PAMOATE 100 MG PO CAPS: 100.0000 mg | ORAL_CAPSULE | Freq: Two times a day (BID) | ORAL | 0 refills | Status: DC

## 2024-05-09 DIAGNOSIS — H5203 Hypermetropia, bilateral: Secondary | ICD-10-CM | POA: Diagnosis not present

## 2024-05-10 ENCOUNTER — Encounter: Admitting: Internal Medicine

## 2024-06-02 ENCOUNTER — Other Ambulatory Visit: Payer: Self-pay | Admitting: Internal Medicine

## 2024-06-06 NOTE — Telephone Encounter (Signed)
 Requested medications are due for refill today.  yes  Requested medications are on the active medications list.  yes  Last refill. 03/09/2024 #90 0 rf  Future visit scheduled.   no  Notes to clinic.  Abnormal labs.    Requested Prescriptions  Pending Prescriptions Disp Refills   levothyroxine  (SYNTHROID ) 125 MCG tablet [Pharmacy Med Name: Levothyroxine  Sodium 125 MCG Oral Tablet] 90 tablet 0    Sig: Take 1 tablet by mouth once daily     Endocrinology:  Hypothyroid Agents Failed - 06/06/2024  9:10 AM      Failed - TSH in normal range and within 360 days    TSH  Date Value Ref Range Status  02/08/2024 5.92 (H) mIU/L Final    Comment:              Reference Range .           > or = 20 Years  0.40-4.50 .                Pregnancy Ranges           First trimester    0.26-2.66           Second trimester   0.55-2.73           Third trimester    0.43-2.91          Passed - Valid encounter within last 12 months    Recent Outpatient Visits           3 months ago Type 2 diabetes mellitus with hyperglycemia, without long-term current use of insulin [facility])   [place] [facility] [place], [place], [place]

## 2024-06-23 ENCOUNTER — Other Ambulatory Visit: Payer: Self-pay | Admitting: Internal Medicine

## 2024-06-26 NOTE — Telephone Encounter (Signed)
 Requested Prescriptions  Pending Prescriptions Disp Refills   losartan  (COZAAR ) 100 MG tablet [Pharmacy Med Name: Losartan  Potassium 100 MG Oral Tablet] 90 tablet 0    Sig: Take 1 tablet by mouth once daily     Cardiovascular:  Angiotensin Receptor Blockers Passed - 06/26/2024  9:22 AM      Passed - Cr in normal range and within 180 days    Creat  Date Value Ref Range Status  02/08/2024 0.67 0.50 - 1.03 mg/dL Final   Creatinine, Urine  Date Value Ref Range Status  02/08/2024 69 20 - 275 mg/dL Final         Passed - K in normal range and within 180 days    Potassium  Date Value Ref Range Status  02/08/2024 4.3 3.5 - 5.3 mmol/L Final         Passed - Patient is not pregnant      Passed - Last BP in normal range    BP Readings from Last 1 Encounters:  02/08/24 128/74         Passed - Valid encounter within last 6 months    Recent Outpatient Visits           4 months ago Type 2 diabetes mellitus with hyperglycemia, without long-term current use of insulin [facility])   [place] [facility] [place], [place], [place]

## 2024-06-28 ENCOUNTER — Other Ambulatory Visit: Payer: Self-pay | Admitting: Internal Medicine

## 2024-06-29 NOTE — Telephone Encounter (Signed)
 Requested Prescriptions  Refused Prescriptions Disp Refills   losartan  (COZAAR ) 100 MG tablet [Pharmacy Med Name: Losartan  Potassium 100 MG Oral Tablet] 90 tablet 0    Sig: Take 1 tablet by mouth once daily     Cardiovascular:  Angiotensin Receptor Blockers Passed - 06/29/2024  2:01 PM      Passed - Cr in normal range and within 180 days    Creat  Date Value Ref Range Status  02/08/2024 0.67 0.50 - 1.03 mg/dL Final   Creatinine, Urine  Date Value Ref Range Status  02/08/2024 69 20 - 275 mg/dL Final         Passed - K in normal range and within 180 days    Potassium  Date Value Ref Range Status  02/08/2024 4.3 3.5 - 5.3 mmol/L Final         Passed - Patient is not pregnant      Passed - Last BP in normal range    BP Readings from Last 1 Encounters:  02/08/24 128/74         Passed - Valid encounter within last 6 months    Recent Outpatient Visits           4 months ago Type 2 diabetes mellitus with hyperglycemia, without long-term current use of insulin [facility])   [place] [facility] [place], [place], [place]

## 2024-07-01 ENCOUNTER — Other Ambulatory Visit: Payer: Self-pay | Admitting: Internal Medicine

## 2024-07-04 NOTE — Telephone Encounter (Signed)
 Requested Prescriptions  Pending Prescriptions Disp Refills   sertraline  (ZOLOFT ) 100 MG tablet [Pharmacy Med Name: Sertraline  HCl 100 MG Oral Tablet] 90 tablet 0    Sig: Take 1 tablet by mouth once daily     Psychiatry:  Antidepressants - SSRI - sertraline  Passed - 07/04/2024  8:54 AM      Passed - AST in normal range and within 360 days    AST  Date Value Ref Range Status  02/08/2024 14 10 - 35 U/L Final         Passed - ALT in normal range and within 360 days    ALT  Date Value Ref Range Status  02/08/2024 17 6 - 29 U/L Final         Passed - Completed PHQ-2 or PHQ-9 in the last 360 days      Passed - Valid encounter within last 6 months    Recent Outpatient Visits           4 months ago Type 2 diabetes mellitus with hyperglycemia, without long-term current use of insulin (HCC)   [place] [facility] [place], [place], NP               glipiZIDE  (GLUCOTROL ) 10 MG tablet [Pharmacy Med Name: glipiZIDE  10 MG Oral Tablet] 180 tablet 0    Sig: TAKE 1 TABLET BY MOUTH TWICE DAILY BEFORE A MEAL     Endocrinology:  Diabetes - Sulfonylureas Passed - 07/04/2024  8:54 AM      Passed - HBA1C is between 0 and 7.9 and within 180 days    Hgb A1c MFr Bld  Date Value Ref Range Status  02/08/2024 6.6 (H) <5.7 % Final    Comment:    For someone without known diabetes, a hemoglobin A1c value of 6.5% or greater indicates that they may have  diabetes and this should be confirmed with a follow-up  test. . For someone with known diabetes, a value <7% indicates  that their diabetes is well controlled and a value  greater than or equal to 7% indicates suboptimal  control. A1c targets should be individualized based on  duration of diabetes, age, comorbid conditions, and  other considerations. . Currently, no consensus exists regarding use of hemoglobin A1c for diagnosis of diabetes for children. .          Passed - Cr in normal range and within 360 days    Creat   Date Value Ref Range Status  02/08/2024 0.67 0.50 - 1.03 mg/dL Final   Creatinine, Urine  Date Value Ref Range Status  02/08/2024 69 20 - 275 mg/dL Final         Passed - Valid encounter within last 6 months    Recent Outpatient Visits           4 months ago Type 2 diabetes mellitus with hyperglycemia, without long-term current use of insulin [facility])   [place] [place], Angeline [place], [place]

## 2024-07-13 ENCOUNTER — Encounter: Payer: Self-pay | Admitting: Internal Medicine

## 2024-07-13 ENCOUNTER — Other Ambulatory Visit: Payer: Self-pay | Admitting: Internal Medicine

## 2024-07-14 NOTE — Telephone Encounter (Signed)
 Requested Prescriptions  Pending Prescriptions Disp Refills   hydrOXYzine  (VISTARIL ) 100 MG capsule [Pharmacy Med Name: hydrOXYzine  Pamoate 100 MG Oral Capsule] 180 capsule 1    Sig: Take 1 capsule by mouth twice daily     Ear, Nose, and Throat:  Antihistamines 2 Passed - 07/14/2024 12:04 PM      Passed - Cr in normal range and within 360 days    Creat  Date Value Ref Range Status  02/08/2024 0.67 0.50 - 1.03 mg/dL Final   Creatinine, Urine  Date Value Ref Range Status  02/08/2024 69 20 - 275 mg/dL Final         Passed - Valid encounter within last 12 months    Recent Outpatient Visits           5 months ago Type 2 diabetes mellitus with hyperglycemia, without long-term current use of insulin [facility])   [place] [facility] [place], Ang[place], [place]

## 2024-07-21 ENCOUNTER — Ambulatory Visit (INDEPENDENT_AMBULATORY_CARE_PROVIDER_SITE_OTHER): Admitting: Internal Medicine

## 2024-07-21 VITALS — BP 128/82 | Ht 68.75 in | Wt 285.2 lb

## 2024-07-21 DIAGNOSIS — E66813 Obesity, class 3: Secondary | ICD-10-CM

## 2024-07-21 DIAGNOSIS — M5442 Lumbago with sciatica, left side: Secondary | ICD-10-CM

## 2024-07-21 DIAGNOSIS — Z6841 Body Mass Index (BMI) 40.0 and over, adult: Secondary | ICD-10-CM

## 2024-07-21 DIAGNOSIS — G8929 Other chronic pain: Secondary | ICD-10-CM | POA: Diagnosis not present

## 2024-07-21 MED ORDER — METHOCARBAMOL 500 MG PO TABS: 500.0000 mg | ORAL_TABLET | Freq: Every evening | ORAL | 0 refills | Status: DC | PRN

## 2024-07-21 MED ORDER — PREDNISONE 10 MG PO TABS: ORAL_TABLET | ORAL | 0 refills | Status: DC
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
## 2024-07-21 NOTE — Patient Instructions (Signed)

## 2024-07-21 NOTE — Assessment & Plan Note (Signed)
 Encouraged diet and exercise for weight loss ?

## 2024-07-27 ENCOUNTER — Encounter: Payer: Self-pay | Admitting: Internal Medicine

## 2024-07-27 ENCOUNTER — Ambulatory Visit
Admission: RE | Admit: 2024-07-27 | Discharge: 2024-07-27 | Disposition: A | Discharge status: Home / Self Care | Source: Ambulatory Visit | Attending: Internal Medicine | Admitting: Internal Medicine

## 2024-07-27 DIAGNOSIS — M47817 Spondylosis without myelopathy or radiculopathy, lumbosacral region: Secondary | ICD-10-CM | POA: Diagnosis not present

## 2024-07-27 DIAGNOSIS — M5430 Sciatica, unspecified side: Secondary | ICD-10-CM | POA: Diagnosis not present

## 2024-07-27 DIAGNOSIS — G8929 Other chronic pain: Secondary | ICD-10-CM

## 2024-07-27 DIAGNOSIS — M5136 Other intervertebral disc degeneration, lumbar region with discogenic back pain only: Secondary | ICD-10-CM | POA: Diagnosis not present

## 2024-07-27 DIAGNOSIS — M5442 Lumbago with sciatica, left side: Secondary | ICD-10-CM | POA: Insufficient documentation

## 2024-07-27 NOTE — Addendum Note (Signed)
 Addended by: ANTONETTE ANGELINE ORN on: 07/27/2024 11:53 AM   Modules accepted: Orders

## 2024-08-02 ENCOUNTER — Ambulatory Visit: Payer: Self-pay | Admitting: Internal Medicine

## 2024-08-02 DIAGNOSIS — I700 Atherosclerosis of aorta: Secondary | ICD-10-CM | POA: Insufficient documentation

## 2024-08-02 MED ORDER — TRAMADOL HCL 50 MG PO TABS: 50.0000 mg | ORAL_TABLET | Freq: Every day | ORAL | 0 refills | Status: DC | PRN

## 2024-08-02 NOTE — Addendum Note (Signed)
 Addended by: ANTONETTE ANGELINE ORN on: 08/02/2024 11:53 AM   Modules accepted: Orders

## 2024-08-03 ENCOUNTER — Other Ambulatory Visit (HOSPITAL_COMMUNITY): Payer: Self-pay

## 2024-08-03 NOTE — Telephone Encounter (Signed)
 PA request has been Started.

## 2024-08-03 NOTE — Telephone Encounter (Signed)
 Pharmacy Patient Advocate Encounter  Received notification from [facility] MEDICAID that Prior Authorization for traMADol  HCl 50MG  tablets has been APPROVED from 08/03/2024 to 02/01/2025   PA #/Case ID/Reference #: [id number]

## 2024-08-07 ENCOUNTER — Other Ambulatory Visit: Payer: Self-pay

## 2024-08-07 ENCOUNTER — Telehealth: Payer: Self-pay

## 2024-08-07 MED ORDER — ATORVASTATIN CALCIUM 10 MG PO TABS: 10.0000 mg | ORAL_TABLET | Freq: Every day | ORAL | 1 refills | Status: AC

## 2024-08-07 MED ORDER — TRAMADOL HCL 50 MG PO TABS: 50.0000 mg | ORAL_TABLET | Freq: Every day | ORAL | 0 refills | Status: AC | PRN

## 2024-08-07 NOTE — Addendum Note (Signed)
 Addended by: ANTONETTE ANGELINE ORN on: 08/07/2024 01:06 PM   Modules accepted: Orders

## 2024-08-07 NOTE — Telephone Encounter (Signed)
 refilled

## 2024-08-07 NOTE — Telephone Encounter (Signed)
 Copied from CRM #[id number]. Topic: Clinical - Medication Question >> Aug 07, 2024 11:40 AM Olam RAMAN wrote: Reason for CRM: pt called about traMADol  (ULTRAM ) 50 MG tablet stated the pharmacy said Medicaid will only pay for 5 pills. Dr vidal have to contact Medicaid to get approved. pt stated on mychart shows approved but pharmacy stated pt would need a new RX CB [phone]

## 2024-08-08 MED ORDER — METHOCARBAMOL 500 MG PO TABS: 500.0000 mg | ORAL_TABLET | Freq: Three times a day (TID) | ORAL | 2 refills | Status: AC | PRN

## 2024-08-08 NOTE — Addendum Note (Signed)
 Addended by: ANTONETTE ANGELINE ORN on: 08/08/2024 08:59 AM   Modules accepted: Orders

## 2024-09-15 ENCOUNTER — Other Ambulatory Visit: Payer: Self-pay | Admitting: Internal Medicine

## 2024-09-19 NOTE — Telephone Encounter (Signed)
 Requested Prescriptions  Pending Prescriptions Disp Refills   levothyroxine  (SYNTHROID ) 125 MCG tablet [Pharmacy Med Name: Levothyroxine  Sodium 125 MCG Oral Tablet] 90 tablet 0    Sig: Take 1 tablet by mouth once daily     Endocrinology:  Hypothyroid Agents Failed - 09/19/2024  3:13 PM      Failed - TSH in normal range and within 360 days    TSH  Date Value Ref Range Status  02/08/2024 5.92 (H) mIU/L Final    Comment:              Reference Range .           > or = 20 Years  0.40-4.50 .                Pregnancy Ranges           First trimester    0.26-2.66           Second trimester   0.55-2.73           Third trimester    0.43-2.91          Passed - Valid encounter within last 12 months    Recent Outpatient Visits           2 months ago Chronic left-sided low back pain with left-sided sciatica   [place] [facility] [place], [place], NP   7 months ago Type 2 diabetes mellitus with hyperglycemia, without long-term current use of insulin [facility])   [place] [facility] [place], [place], [place]

## 2024-09-28 ENCOUNTER — Other Ambulatory Visit: Payer: Self-pay | Admitting: Internal Medicine

## 2024-09-29 ENCOUNTER — Other Ambulatory Visit: Payer: Self-pay | Admitting: Internal Medicine

## 2024-09-29 NOTE — Telephone Encounter (Signed)
 Requested Prescriptions  Pending Prescriptions Disp Refills   sertraline  (ZOLOFT ) 100 MG tablet [Pharmacy Med Name: Sertraline  HCl 100 MG Oral Tablet] 90 tablet 0    Sig: Take 1 tablet by mouth once daily     Psychiatry:  Antidepressants - SSRI - sertraline  Passed - 09/29/2024  3:56 PM      Passed - AST in normal range and within 360 days    AST  Date Value Ref Range Status  02/08/2024 14 10 - 35 U/L Final         Passed - ALT in normal range and within 360 days    ALT  Date Value Ref Range Status  02/08/2024 17 6 - 29 U/L Final         Passed - Completed PHQ-2 or PHQ-9 in the last 360 days      Passed - Valid encounter within last 6 months    Recent Outpatient Visits           2 months ago Chronic left-sided low back pain with left-sided sciatica   [place] [facility] [place], [place], NP   7 months ago Type 2 diabetes mellitus with hyperglycemia, without long-term current use of insulin [facility])   [place] [facility] [place], [place], [place]

## 2024-10-02 NOTE — Telephone Encounter (Signed)
 Requested Prescriptions  Pending Prescriptions Disp Refills   losartan  (COZAAR ) 100 MG tablet [Pharmacy Med Name: Losartan  Potassium 100 MG Oral Tablet] 90 tablet 0    Sig: Take 1 tablet by mouth once daily     Cardiovascular:  Angiotensin Receptor Blockers Failed - 10/02/2024  3:41 PM      Failed - Cr in normal range and within 180 days    Creat  Date Value Ref Range Status  02/08/2024 0.67 0.50 - 1.03 mg/dL Final   Creatinine, Urine  Date Value Ref Range Status  02/08/2024 69 20 - 275 mg/dL Final         Failed - K in normal range and within 180 days    Potassium  Date Value Ref Range Status  02/08/2024 4.3 3.5 - 5.3 mmol/L Final         Passed - Patient is not pregnant      Passed - Last BP in normal range    BP Readings from Last 1 Encounters:  07/21/24 128/82         Passed - Valid encounter within last 6 months    Recent Outpatient Visits           2 months ago Chronic left-sided low back pain with left-sided sciatica   [place] [facility] [place], [place], NP   7 months ago Type 2 diabetes mellitus with hyperglycemia, without long-term current use of insulin [facility])    [facility] [place], [place], [place]

## 2024-10-30 ENCOUNTER — Ambulatory Visit (HOSPITAL_COMMUNITY): Admission: EM | Admit: 2024-10-30 | Discharge: 2024-10-30 | Disposition: A | Discharge status: Home / Self Care

## 2024-10-30 ENCOUNTER — Encounter (HOSPITAL_COMMUNITY): Payer: Self-pay

## 2024-10-30 ENCOUNTER — Observation Stay (HOSPITAL_COMMUNITY): Admission: EM | Admit: 2024-10-30 | Discharge: 2024-10-31 | Disposition: A

## 2024-10-30 ENCOUNTER — Other Ambulatory Visit: Payer: Self-pay

## 2024-10-30 ENCOUNTER — Emergency Department (HOSPITAL_COMMUNITY)

## 2024-10-30 DIAGNOSIS — E861 Hypovolemia: Secondary | ICD-10-CM

## 2024-10-30 DIAGNOSIS — E66813 Obesity, class 3: Secondary | ICD-10-CM | POA: Diagnosis not present

## 2024-10-30 DIAGNOSIS — E785 Hyperlipidemia, unspecified: Secondary | ICD-10-CM | POA: Insufficient documentation

## 2024-10-30 DIAGNOSIS — E039 Hypothyroidism, unspecified: Secondary | ICD-10-CM | POA: Diagnosis not present

## 2024-10-30 DIAGNOSIS — R0602 Shortness of breath: Secondary | ICD-10-CM | POA: Diagnosis present

## 2024-10-30 DIAGNOSIS — N179 Acute kidney failure, unspecified: Secondary | ICD-10-CM | POA: Diagnosis not present

## 2024-10-30 DIAGNOSIS — A419 Sepsis, unspecified organism: Secondary | ICD-10-CM | POA: Diagnosis not present

## 2024-10-30 DIAGNOSIS — R0789 Other chest pain: Secondary | ICD-10-CM

## 2024-10-30 DIAGNOSIS — F32A Depression, unspecified: Secondary | ICD-10-CM | POA: Diagnosis not present

## 2024-10-30 DIAGNOSIS — F1090 Alcohol use, unspecified, uncomplicated: Secondary | ICD-10-CM | POA: Diagnosis not present

## 2024-10-30 DIAGNOSIS — Z79890 Hormone replacement therapy (postmenopausal): Secondary | ICD-10-CM | POA: Insufficient documentation

## 2024-10-30 DIAGNOSIS — F419 Anxiety disorder, unspecified: Secondary | ICD-10-CM | POA: Diagnosis not present

## 2024-10-30 DIAGNOSIS — F17210 Nicotine dependence, cigarettes, uncomplicated: Secondary | ICD-10-CM | POA: Insufficient documentation

## 2024-10-30 DIAGNOSIS — J189 Pneumonia, unspecified organism: Secondary | ICD-10-CM

## 2024-10-30 DIAGNOSIS — R6520 Severe sepsis without septic shock: Secondary | ICD-10-CM | POA: Insufficient documentation

## 2024-10-30 DIAGNOSIS — Z6841 Body Mass Index (BMI) 40.0 and over, adult: Secondary | ICD-10-CM | POA: Diagnosis not present

## 2024-10-30 DIAGNOSIS — J440 Chronic obstructive pulmonary disease with acute lower respiratory infection: Secondary | ICD-10-CM

## 2024-10-30 DIAGNOSIS — I959 Hypotension, unspecified: Secondary | ICD-10-CM | POA: Diagnosis not present

## 2024-10-30 DIAGNOSIS — M1380 Other specified arthritis, unspecified site: Secondary | ICD-10-CM | POA: Diagnosis not present

## 2024-10-30 DIAGNOSIS — R0781 Pleurodynia: Secondary | ICD-10-CM | POA: Diagnosis not present

## 2024-10-30 DIAGNOSIS — R071 Chest pain on breathing: Principal | ICD-10-CM

## 2024-10-30 DIAGNOSIS — J449 Chronic obstructive pulmonary disease, unspecified: Secondary | ICD-10-CM | POA: Insufficient documentation

## 2024-10-30 DIAGNOSIS — J9601 Acute respiratory failure with hypoxia: Secondary | ICD-10-CM | POA: Diagnosis not present

## 2024-10-30 DIAGNOSIS — F418 Other specified anxiety disorders: Secondary | ICD-10-CM | POA: Insufficient documentation

## 2024-10-30 DIAGNOSIS — E1165 Type 2 diabetes mellitus with hyperglycemia: Secondary | ICD-10-CM | POA: Diagnosis not present

## 2024-10-30 DIAGNOSIS — Z79899 Other long term (current) drug therapy: Secondary | ICD-10-CM | POA: Insufficient documentation

## 2024-10-30 LAB — CBC WITH DIFFERENTIAL/PLATELET
Abs Immature Granulocytes: 0.3 K/uL — ABNORMAL HIGH (ref 0.00–0.07)
Band Neutrophils: 28 %
Basophils Absolute: 0 K/uL (ref 0.0–0.1)
Basophils Relative: 0 %
Eosinophils Absolute: 0 K/uL (ref 0.0–0.5)
Eosinophils Relative: 0 %
HCT: 42.3 % (ref 36.0–46.0)
Hemoglobin: 14.1 g/dL (ref 12.0–15.0)
Lymphocytes Relative: 12 %
Lymphs Abs: 1.2 K/uL (ref 0.7–4.0)
MCH: 29.7 pg (ref 26.0–34.0)
MCHC: 33.3 g/dL (ref 30.0–36.0)
MCV: 89.1 fL (ref 80.0–100.0)
Metamyelocytes Relative: 3 %
Monocytes Absolute: 0.2 K/uL (ref 0.1–1.0)
Monocytes Relative: 2 %
Neutro Abs: 8.5 K/uL — ABNORMAL HIGH (ref 1.7–7.7)
Neutrophils Relative %: 55 %
Platelets: 243 K/uL (ref 150–400)
RBC: 4.75 MIL/uL (ref 3.87–5.11)
RDW: 13.2 % (ref 11.5–15.5)
WBC: 10.2 K/uL (ref 4.0–10.5)
nRBC: 0 % (ref 0.0–0.2)

## 2024-10-30 LAB — PROTIME-INR
INR: 1.1 (ref 0.8–1.2)
Prothrombin Time: 14.7 s (ref 11.4–15.2)

## 2024-10-30 LAB — CBG MONITORING, ED: Glucose-Capillary: 186 mg/dL — ABNORMAL HIGH (ref 70–99)

## 2024-10-30 LAB — I-STAT CG4 LACTIC ACID, ED
Lactic Acid, Venous: 2.2 mmol/L (ref 0.5–1.9)
Lactic Acid, Venous: 2.1 mmol/L (ref 0.5–1.9)

## 2024-10-30 LAB — COMPREHENSIVE METABOLIC PANEL WITH GFR
ALT: 19 U/L (ref 0–44)
AST: 17 U/L (ref 15–41)
Albumin: 3.6 g/dL (ref 3.5–5.0)
Alkaline Phosphatase: 104 U/L (ref 38–126)
Anion gap: 14 (ref 5–15)
BUN: 26 mg/dL — ABNORMAL HIGH (ref 6–20)
CO2: 24 mmol/L (ref 22–32)
Calcium: 8.7 mg/dL — ABNORMAL LOW (ref 8.9–10.3)
Chloride: 93 mmol/L — ABNORMAL LOW (ref 98–111)
Creatinine, Ser: 1.22 mg/dL — ABNORMAL HIGH (ref 0.44–1.00)
GFR, Estimated: 52 mL/min — ABNORMAL LOW
Glucose, Bld: 202 mg/dL — ABNORMAL HIGH (ref 70–99)
Potassium: 3.8 mmol/L (ref 3.5–5.1)
Sodium: 131 mmol/L — ABNORMAL LOW (ref 135–145)
Total Bilirubin: 1.1 mg/dL (ref 0.0–1.2)
Total Protein: 6.7 g/dL (ref 6.5–8.1)

## 2024-10-30 LAB — TROPONIN T, HIGH SENSITIVITY
Troponin T High Sensitivity: 17 ng/L (ref 0–19)
Troponin T High Sensitivity: <15 ng/L (ref 0–19)

## 2024-10-30 MED ORDER — INSULIN ASPART 100 UNIT/ML IJ SOLN: 0.0000 [IU] | Freq: Every day | INTRAMUSCULAR | Status: DC

## 2024-10-30 MED ORDER — ACETAMINOPHEN 500 MG PO TABS
1000.0000 mg | ORAL_TABLET | Freq: Once | ORAL | Status: AC
  Administered 2024-10-31: 1000 mg via ORAL
  Filled 2024-10-30: qty 2

## 2024-10-30 MED ORDER — SODIUM CHLORIDE 0.9 % IV SOLN
100.0000 mg | Freq: Once | INTRAVENOUS | Status: AC
  Administered 2024-10-30: 100 mg via INTRAVENOUS
  Filled 2024-10-30: qty 100

## 2024-10-30 MED ORDER — SODIUM CHLORIDE 0.9 % IV SOLN: 500.0000 mg | Freq: Once | INTRAVENOUS | Status: DC

## 2024-10-30 MED ORDER — GLIPIZIDE 10 MG PO TABS
10.0000 mg | ORAL_TABLET | Freq: Every day | ORAL | Status: DC
  Administered 2024-10-31: 10 mg via ORAL
  Filled 2024-10-30: qty 1

## 2024-10-30 MED ORDER — SODIUM CHLORIDE 0.9 % IV SOLN
2.0000 g | Freq: Once | INTRAVENOUS | Status: AC
  Administered 2024-10-30: 2 g via INTRAVENOUS
  Filled 2024-10-30: qty 20

## 2024-10-30 MED ORDER — ALBUTEROL SULFATE HFA 108 (90 BASE) MCG/ACT IN AERS
2.0000 | INHALATION_SPRAY | Freq: Once | RESPIRATORY_TRACT | Status: AC
  Administered 2024-10-30: 2 via RESPIRATORY_TRACT
  Filled 2024-10-30: qty 6.7

## 2024-10-30 MED ORDER — ACETAMINOPHEN 325 MG PO TABS: 650.0000 mg | ORAL_TABLET | Freq: Four times a day (QID) | ORAL | Status: DC | PRN

## 2024-10-30 MED ORDER — LACTATED RINGERS IV BOLUS
1000.0000 mL | Freq: Once | INTRAVENOUS | Status: AC
  Administered 2024-10-31: 1000 mL via INTRAVENOUS

## 2024-10-30 MED ORDER — SODIUM CHLORIDE 0.9 % IV SOLN: INTRAVENOUS | Status: DC

## 2024-10-30 MED ORDER — INSULIN ASPART 100 UNIT/ML IJ SOLN
0.0000 [IU] | Freq: Three times a day (TID) | INTRAMUSCULAR | Status: DC
  Administered 2024-10-31: 2 [IU] via SUBCUTANEOUS
  Filled 2024-10-30: qty 2

## 2024-10-30 MED ORDER — HYDROXYZINE HCL 50 MG PO TABS
100.0000 mg | ORAL_TABLET | Freq: Two times a day (BID) | ORAL | Status: DC
  Administered 2024-10-30 – 2024-10-31 (×2): 100 mg via ORAL
  Filled 2024-10-30 (×4): qty 2

## 2024-10-30 MED ORDER — LACTATED RINGERS IV BOLUS
1000.0000 mL | Freq: Once | INTRAVENOUS | Status: AC
  Administered 2024-10-30: 1000 mL via INTRAVENOUS

## 2024-10-30 MED ORDER — LEVOTHYROXINE SODIUM 25 MCG PO TABS
125.0000 ug | ORAL_TABLET | Freq: Every day | ORAL | Status: DC
  Administered 2024-10-31: 125 ug via ORAL
  Filled 2024-10-30: qty 1

## 2024-10-30 MED ORDER — SERTRALINE HCL 100 MG PO TABS
100.0000 mg | ORAL_TABLET | Freq: Every day | ORAL | Status: DC
  Administered 2024-10-30 – 2024-10-31 (×2): 100 mg via ORAL
  Filled 2024-10-30 (×2): qty 1

## 2024-10-30 MED ORDER — LIDOCAINE 5 % EX PTCH
1.0000 | MEDICATED_PATCH | Freq: Every day | CUTANEOUS | Status: DC
  Administered 2024-10-31: 1 via TRANSDERMAL
  Filled 2024-10-30: qty 1

## 2024-10-30 MED ORDER — ONDANSETRON HCL 4 MG/2ML IJ SOLN: 4.0000 mg | Freq: Four times a day (QID) | INTRAMUSCULAR | Status: DC | PRN

## 2024-10-30 MED ORDER — ENOXAPARIN SODIUM 40 MG/0.4ML IJ SOSY
40.0000 mg | PREFILLED_SYRINGE | INTRAMUSCULAR | Status: DC
  Filled 2024-10-30: qty 0.4

## 2024-10-30 MED ORDER — ACETAMINOPHEN 650 MG RE SUPP: 650.0000 mg | Freq: Four times a day (QID) | RECTAL | Status: DC | PRN

## 2024-10-30 MED ORDER — VITAMIN D 25 MCG (1000 UNIT) PO TABS
2000.0000 [IU] | ORAL_TABLET | Freq: Every day | ORAL | Status: DC
  Administered 2024-10-30 – 2024-10-31 (×2): 2000 [IU] via ORAL
  Filled 2024-10-30 (×2): qty 2

## 2024-10-30 MED ORDER — ATORVASTATIN CALCIUM 10 MG PO TABS
10.0000 mg | ORAL_TABLET | Freq: Every day | ORAL | Status: DC
  Administered 2024-10-30 – 2024-10-31 (×2): 10 mg via ORAL
  Filled 2024-10-30 (×2): qty 1

## 2024-10-30 MED ORDER — SENNOSIDES-DOCUSATE SODIUM 8.6-50 MG PO TABS: 1.0000 | ORAL_TABLET | Freq: Every evening | ORAL | Status: DC | PRN

## 2024-10-30 MED ORDER — ONDANSETRON HCL 4 MG PO TABS: 4.0000 mg | ORAL_TABLET | Freq: Four times a day (QID) | ORAL | Status: DC | PRN

## 2024-10-30 MED ORDER — BISACODYL 5 MG PO TBEC: 5.0000 mg | DELAYED_RELEASE_TABLET | Freq: Every day | ORAL | Status: DC | PRN

## 2024-10-30 NOTE — ED Notes (Signed)
 Pt refused to wear 2liters nasal cannula, 90% ra

## 2024-10-30 NOTE — ED Provider Triage Note (Signed)
 Emergency Medicine Provider Triage Evaluation Note  HARMONI LUCUS , a 54 y.o. female  was evaluated in triage.  Pt complains of shortness of breath and chest pain worsening for the past week.  Patient recently diagnosed with flu last week and has report gradually worsening of the symptoms.  Does have COPD.  Feels short of breath even at rest.  Was sent here from urgent care for further workup..  Review of Systems  Positive:  Negative:   Physical Exam  BP 98/62 (BP Location: Right Arm)   Pulse (!) 103   Temp 97.6 F (36.4 C) (Oral)   Resp (!) 21   Ht 5' 8.75 (1.746 m)   Wt 136.1 kg   LMP 05/19/2016 Comment: BTL  SpO2 90%   BMI 44.63 kg/m  Gen:   Awake, comfortable Resp:  Mild tachypnea but no respiratory distress MSK:   Moves extremities without difficulty  Other:  Diminished breath sounds with some coarse rhonchi in the bilateral lower bases  Medical Decision Making  Medically screening exam initiated at 3:59 PM.  Appropriate orders placed.  TAMORA HUNEKE was informed that the remainder of the evaluation will be completed by another provider, this initial triage assessment does not replace that evaluation, and the importance of remaining in the ED until their evaluation is complete.  Patient has softer blood pressure is tachycardic.  Satting 90% on room air.  Ordered her 2 L of oxygen.  Charge nurse aware the patient needs to be roomed immediately.  ED sepsis workup initiated.   Bernis Ernst, PA-C 10/30/24 1600

## 2024-10-30 NOTE — ED Notes (Signed)
 Pt refusing 2nd bag of fluids and stating she wants to go home. Admitting provider made aware and at bedside.

## 2024-10-30 NOTE — ED Provider Notes (Signed)
 " [place] [facility] Provider Note   CSN: [id number] Arrival date & time: 10/30/24  1444     Patient presents with: Chest Pain and Shortness of Breath   Donna Whitaker is a 54 y.o. female.  With a past medical history of COPD, diabetes, hypertension, hypothyroidism who presents today for evaluation of chest pain and shortness of breath.  Patient reports that last week she was sick with the flu.  On Friday developed right-sided chest pain that is pleuritic in nature and associated shortness of breath.  Symptoms have been persistent without any waxing and waning.  Patient has had chills without fevers.  No significant productive cough.  Due to ongoing symptoms and associated weakness presenting today for further evaluation.  Initially presented to urgent care with nonischemic EKG.  Was sent to our facility for further evaluation.   Chest Pain Associated symptoms: shortness of breath   Shortness of Breath Associated symptoms: chest pain        Prior to Admission medications  Medication Sig Start Date End Date Taking? Authorizing Provider  atorvastatin  (LIPITOR) 10 MG tablet Take 1 tablet (10 mg total) by mouth daily. 08/07/24   Antonette Angeline ORN, NP  Cholecalciferol  (EQL VITAMIN D3) 50 MCG (2000 UT) CAPS Take by mouth.    [provider]  glipiZIDE  (GLUCOTROL ) 10 MG tablet TAKE 1 TABLET BY MOUTH TWICE DAILY BEFORE A MEAL 07/04/24   Antonette Angeline ORN, NP  glipiZIDE  (GLUCOTROL ) 5 MG tablet TAKE 1 TABLET BY MOUTH TWICE DAILY BEFORE MEAL(S) 09/27/24   Antonette Angeline ORN, NP  hydrOXYzine  (VISTARIL ) 100 MG capsule Take 1 capsule by mouth twice daily 07/14/24   Antonette Angeline ORN, NP  Lancets [facility] ULTRASOFT) lancets 1 each by Other route in the morning and at bedtime. 01/31/20   Antonette Angeline ORN, NP  levothyroxine  (SYNTHROID ) 125 MCG tablet Take 1 tablet by mouth once daily 09/19/24   Antonette Angeline ORN, NP  losartan  (COZAAR ) 100 MG tablet Take 1 tablet by mouth  once daily 10/02/24   Antonette Angeline ORN, NP  methocarbamol  (ROBAXIN ) 500 MG tablet Take 1 tablet (500 mg total) by mouth every 8 (eight) hours as needed for muscle spasms. 08/08/24   Antonette Angeline ORN, NP  ONETOUCH ULTRA test strip 1 EACH BY OTHER ROUTE IN THE MORNING AND AT BEDTIME. 07/23/20   Antonette Angeline ORN, NP  predniSONE  (DELTASONE ) 10 MG tablet Take 3 tabs on days 1-3, 2 tabs on days 4-6, 1 tab on days 7-9 07/21/24   Antonette Angeline ORN, NP  sertraline  (ZOLOFT ) 100 MG tablet Take 1 tablet by mouth once daily 09/29/24   Antonette Angeline ORN, NP    Allergies: Betadine [povidone iodine], Vicodin [hydrocodone-acetaminophen ], Hydrocodone-acetaminophen , and Iodine    Review of Systems  Respiratory:  Positive for shortness of breath.   Cardiovascular:  Positive for chest pain.    Updated Vital Signs BP 98/62 (BP Location: Right Arm)   Pulse (!) 103   Temp 97.6 F (36.4 C) (Oral)   Resp (!) 21   Ht 5' 8.75 (1.746 m)   Wt 136.1 kg   LMP 05/19/2016 Comment: BTL  SpO2 90%   BMI 44.63 kg/m   Physical Exam Constitutional:      General: She is not in acute distress.    Appearance: She is well-developed.  HENT:     Head: Normocephalic.  Eyes:     Pupils: Pupils are equal, round, and reactive to light.  Cardiovascular:  Rate and Rhythm: Normal rate and regular rhythm.     Heart sounds: Normal heart sounds.  Pulmonary:     Effort: Pulmonary effort is normal.     Breath sounds: Examination of the right-upper field reveals rhonchi. Examination of the right-middle field reveals rhonchi. Examination of the right-lower field reveals rhonchi. Rhonchi present. No wheezing.  Chest:     Chest wall: No tenderness.  Abdominal:     Palpations: Abdomen is soft.  Musculoskeletal:        General: Normal range of motion.     Cervical back: Normal range of motion.  Skin:    General: Skin is warm.  Neurological:     General: No focal deficit present.     Mental Status: She is alert.  Psychiatric:         Mood and Affect: Mood normal.     (all labs ordered are listed, but only abnormal results are displayed) Labs Reviewed  CULTURE, BLOOD (ROUTINE X 2)  CULTURE, BLOOD (ROUTINE X 2)  COMPREHENSIVE METABOLIC PANEL WITH GFR  CBC WITH DIFFERENTIAL/PLATELET  PROTIME-INR  URINALYSIS, W/ REFLEX TO CULTURE (INFECTION SUSPECTED)  I-STAT CG4 LACTIC ACID, ED  TROPONIN T, HIGH SENSITIVITY    EKG: None  Radiology: No results found.   Medical Decision Making Risk Decision regarding hospitalization.   Patient is a 54 year old female who been today for evaluation of right-sided pleuritic chest pain in setting of recent viral infection.  On initial assessment patient was noted to be hypoxic with oxygen saturation in the 90s as well as mild tachycardia with a heart rate of 103.  On bedside assessment patient was noted to be uncomfortable appearing.  Physical examination notable for rhonchorous lung sounds on the right lung fields without any obvious wheezing.  At the time my assessment patient already had laboratory evaluation that was completed in the triage process did not show any significant leukocytosis however on my gross review of the checks x-ray she has evidence of a right middle lobe opacity.  Overall clinical presentation at this time is concerning for pneumonia and will provide ceftriaxone  and azithromycin  for initial antibiotic therapy.  Patient has no evidence at this time of hypercapnic respiratory failure my assessment and otherwise low concerns for ACS, acute aortic pathology.  While patient is mildly hypoxic as well as tachycardic, pulmonary embolism is considered however with focal findings on chest x-ray, less likely.  Will defer further CT imaging at this point in time.  Patient was also admitted to the medicine service for further treatment of right upper lobe pneumonia.  Ceftriaxone  as well as doxycycline  provided for antibiotic therapy.  Final diagnoses:  Chest pain on  breathing  Shortness of breath  Pneumonia of right upper lobe due to infectious organism    ED Discharge Orders     None          Laurita Sieving, MD 10/30/24 2229    Tonia Chew, MD 10/30/24 2300  "

## 2024-10-30 NOTE — ED Triage Notes (Signed)
 Pt dx with Flu A last week but has increasing chest pain and SOB. Per UC discharge paperwork, pts EKG at UC unremarkable but sent for potential cardiac workup due to ongoing and increased symptoms. Pt breathing labored at check in.

## 2024-10-30 NOTE — H&P (Incomplete)
 " History and Physical  Donna Whitaker FMW:[id number] DOB: [birth year] DOA: 10/30/2024  PCP: Antonette Angeline ORN, NP   Chief Complaint: Chest pain, shortness of breath  HPI: Donna Whitaker is a 54 y.o. female with medical history significant for COPD, tobacco use disorder, T2DM, HTN, obesity, hypothyroidism, arthritis, anxiety and depression who was sent from the urgent care to the ED for evaluation of chest pain and shortness of breath. Patient reports that last week, she was sick with the flu, managed with OTC medications and Tylenol . On Friday, she started having right sided pleuritic chest pain with associated shortness of breath. Her symptoms did not improved so she presented to the urgent care today for further evaluation.  After evaluation, patient was sent directly to the ER due to concern for possible pneumonia or ACS.  Patient endorses associated generalized malaise, chills and mild cough but no fevers, nausea, vomiting, abdominal pain, dizziness or headache.  ED Course: Initial vitals show patient afebrile, RR 15-28, HR 100-110s, SBP 80s to 100s, SpO2 93% on 3 L [place]. Initial labs significant for sodium 131, glucose 202, creatinine 1.22, normal CBC, troponin 2.2-2.1, normal troponin and INR. CXR shows right upper lobe pneumonia. Pt received albuterol  neb, IV Rocephin , IV doxycycline  and IV LR 1 L bolus x 2. TRH was consulted for admission.   Review of Systems: Please see HPI for pertinent positives and negatives. A complete 10 system review of systems are otherwise negative.  Past Medical History:  Diagnosis Date   Allergy    Anxiety    Arthritis    knees, back   COPD (chronic obstructive pulmonary disease) (HCC)    uses albuterol  as needed   Depression    Diabetes mellitus without complication (HCC)    Heavy smoker    2ppd   History of chicken pox    Hypertension    Hypothyroidism    Intraductal papilloma of breast, left    Past Surgical History:  Procedure Laterality Date    BREAST LUMPECTOMY WITH RADIOACTIVE SEED LOCALIZATION Left 08/08/2020   Procedure: LEFT BREAST LUMPECTOMY WITH RADIOACTIVE SEED LOCALIZATION;  Surgeon: Vernetta Berg, MD;  Location: [facility];  Service: General;  Laterality: Left;  LMA   TUBAL LIGATION  1992   WISDOM TOOTH EXTRACTION     Social History:  reports that she has been smoking cigarettes. She has a 70 pack-year smoking history. She has never used smokeless tobacco. She reports current alcohol use. She reports that she does not use drugs.  Allergies[1]  Family History  Problem Relation Age of Onset   Arthritis Mother    Hyperlipidemia Mother    Alcohol abuse Father    Hyperlipidemia Father    Heart disease Father    Hypertension Father    Arthritis Maternal Grandmother    Hyperlipidemia Maternal Grandmother    Heart disease Maternal Grandmother    Hypertension Maternal Grandmother    Diabetes Maternal Grandmother    Hyperlipidemia Maternal Grandfather    Hypertension Maternal Grandfather    Hyperlipidemia Paternal Grandmother    Hypertension Paternal Grandmother    Stomach cancer Paternal Grandmother    Alcohol abuse Paternal Grandfather    Hyperlipidemia Paternal Grandfather    Heart disease Paternal Grandfather    Hypertension Paternal Grandfather      Prior to Admission medications  Medication Sig Start Date End Date Taking? Authorizing Provider  acetaminophen  (TYLENOL ) 500 MG tablet Take 1,000 mg by mouth every 6 (six) hours as needed for fever.  Yes [provider]  atorvastatin  (LIPITOR) 10 MG tablet Take 1 tablet (10 mg total) by mouth daily. 08/07/24  Yes Baity, Angeline ORN, NP  Cholecalciferol  (EQL VITAMIN D3) 50 MCG (2000 UT) CAPS Take by mouth.   Yes [provider]  glipiZIDE  (GLUCOTROL ) 10 MG tablet TAKE 1 TABLET BY MOUTH TWICE DAILY BEFORE A MEAL Patient taking differently: Take 10 mg by mouth daily before breakfast. 07/04/24  Yes Baity, Angeline ORN, NP  hydrOXYzine   (VISTARIL ) 100 MG capsule Take 1 capsule by mouth twice daily 07/14/24  Yes Baity, Angeline ORN, NP  Ibuprofen  200 MG CAPS Take 1 capsule by mouth every 6 (six) hours as needed (fever).   Yes [provider]  levothyroxine  (SYNTHROID ) 125 MCG tablet Take 1 tablet by mouth once daily 09/19/24  Yes Baity, Angeline ORN, NP  losartan  (COZAAR ) 100 MG tablet Take 1 tablet by mouth once daily 10/02/24  Yes Baity, Angeline ORN, NP  Pseudoeph-Doxylamine-DM-APAP (DAYQUIL/NYQUIL COLD/FLU RELIEF PO) Take 1 Capful by mouth daily as needed (FLU SYMPTOMS).   Yes [provider]  sertraline  (ZOLOFT ) 100 MG tablet Take 1 tablet by mouth once daily 09/29/24  Yes Baity, Angeline ORN, NP  glipiZIDE  (GLUCOTROL ) 5 MG tablet TAKE 1 TABLET BY MOUTH TWICE DAILY BEFORE MEAL(S) Patient not taking: Reported on 10/30/2024 09/27/24   Antonette Angeline ORN, NP  methocarbamol  (ROBAXIN ) 500 MG tablet Take 1 tablet (500 mg total) by mouth every 8 (eight) hours as needed for muscle spasms. Patient not taking: Reported on 10/30/2024 08/08/24   Antonette Angeline ORN, NP  predniSONE  (DELTASONE ) 10 MG tablet Take 3 tabs on days 1-3, 2 tabs on days 4-6, 1 tab on days 7-9 Patient not taking: Reported on 10/30/2024 07/21/24   Antonette Angeline ORN, NP    Physical Exam: BP 104/61   Pulse (!) 111   Temp 98.2 F (36.8 C) (Oral)   Resp (!) 26   Ht 5' 8.75 (1.746 m)   Wt 136.1 kg   LMP 05/19/2016 Comment: BTL  SpO2 95%   BMI 44.63 kg/m  General: Irritated, morbidly obese woman sitting in the hallway bed. No acute distress. HEENT: [place]/AT. Anicteric sclera. Dry mucous membrane. CV: Tachycardic. Regular rhythm. No murmurs, rubs, or gallops. No LE edema Pulmonary: Lungs CTAB. Normal effort. RUL Rhonchi. No wheezing or rales. Decreased breath sounds throughout. Abdominal: Soft, nontender, nondistended. Normal bowel sounds. Extremities: Palpable radial and DP pulses. Normal ROM. Skin: Warm and dry. No obvious rash or lesions. Neuro: A&Ox3. Moves all  extremities. Normal sensation to light touch. No focal deficit. Psych: Irritated mood          Labs on Admission:  Basic Metabolic Panel: Recent Labs  Lab 10/30/24 1626  NA 131*  K 3.8  CL 93*  CO2 24  GLUCOSE 202*  BUN 26*  CREATININE 1.22*  CALCIUM  8.7*   Liver Function Tests: Recent Labs  Lab 10/30/24 1626  AST 17  ALT 19  ALKPHOS 104  BILITOT 1.1  PROT 6.7  ALBUMIN 3.6   No results for input(s): LIPASE, AMYLASE in the last 168 hours. No results for input(s): AMMONIA in the last 168 hours. CBC: Recent Labs  Lab 10/30/24 1626  WBC 10.2  NEUTROABS 8.5*  HGB 14.1  HCT 42.3  MCV 89.1  PLT 243   Cardiac Enzymes: No results for input(s): CKTOTAL, CKMB, CKMBINDEX, TROPONINI in the last 168 hours. BNP (last 3 results) No results for input(s): BNP in the last 8760 hours.  ProBNP (  last 3 results) No results for input(s): PROBNP in the last 8760 hours.  CBG: No results for input(s): GLUCAP in the last 168 hours.  Radiological Exams on Admission: DG Chest Port 1 View Result Date: 10/30/2024 CLINICAL DATA:  Shortness of breath and sepsis. EXAM: PORTABLE CHEST 1 VIEW COMPARISON:  None Available. FINDINGS: The cardiac silhouette, mediastinal and hilar contours are within normal limits. Fairly dense airspace consolidation in the right upper lobe consistent with pneumonia. Suspect small right parapneumonic effusion. Streaky bibasilar atelectasis. The bony thorax is intact. IMPRESSION: Right upper lobe pneumonia. Followup PA and lateral chest X-ray is recommended in 3-4 weeks following trial of antibiotic therapy to ensure resolution and exclude underlying malignancy. Electronically Signed   By: MYRTIS Stammer M.D.   On: 10/30/2024 17:37   My independent interpretation of EKG: Sinus tach with possible LAE  Assessment/Plan Donna Whitaker is a 54 y.o. female with medical history significant for COPD, tobacco use disorder, T2DM, HTN, obesity,  hypothyroidism, arthritis, anxiety and depression who was sent from the urgent care to the ED for evaluation of chest pain and shortness of breath and admitted for severe sepsis.  # Severe sepsis # Community-acquired pneumonia - Presented with presented with progressive shortness of breath and right-sided chest pain after recent flu/cold infection - CXR shows evidence of RUL pneumonia - Patient nontoxic-appearing but remains hypotensive and tachycardic - Met sepsis criteria with tachypnea, tachycardia, hypotension, lactic acidosis, AKI and evidence of respiratory infection - Continue IV Rocephin  and doxycycline  - Give additional IV LR 1 L bolus and start IV NS 150 cc/h - Follow-up blood culture and urinalysis - Trend CBC, fever curve  # Hypotension - Patient with low BP with SBP in the 90s to 100s since admission, MAPs have been stable in the 60s to 70s - Likely from combination of hypovolemia and sepsis, lactic acid only minimally elevated, 2.2->2.1 - Patient nontoxic appearing but at risk of septic shock - IV boluses as needed to maintain MAP > 65 - Follow-up repeat lactate  # AKI - Creatinine minimally elevated to 1.22 from normal baseline - Likely secondary to dehydration in the setting of respiratory infection - IV hydration as above - Trend renal function and avoid nephrotoxic meds  # T2DM with hyperglycemia - Last A1c 6.6% 8 months ago, glucose 202 on admission - Continue home glipizide  - SSI with meals, CBG monitoring - Carb modified diet - F/u repeat A1c  # Acute hypoxic respiratory failure # COPD - Patient with new O2 requirements of 3 L [place] in the setting of acute pneumonia - No respiratory distress or wheezing - As needed DuoNebs  # HLD - Continue atorvastatin   # Hypothyroidism - Continue Synthroid   # Anxiety and depression - Continue on sertraline  and hydroxyzine   # Arthritis - History of osteoarthritis of the back and knees - Lidocaine  patch and as  needed Tylenol  for pain  # Class III obesity Body mass index is 44.63 kg/m. Filed Weights   10/30/24 1557  Weight: 136.1 kg  - F/u with PCP for weight lost and nutrition counseling  DVT prophylaxis: Lovenox      Code Status: Full Code  Consults called: None  Family Communication: Discussed results/findings and plan for admission with spouse at bedside  Severity of Illness: The appropriate patient status for this patient is INPATIENT. Inpatient status is judged to be reasonable and necessary in order to provide the required intensity of service to ensure the patient's safety. The patient's presenting symptoms, physical exam  findings, and initial radiographic and laboratory data in the context of their chronic comorbidities is felt to place them at high risk for further clinical deterioration. Furthermore, it is not anticipated that the patient will be medically stable for discharge from the hospital within 2 midnights of admission.   * I certify that at the point of admission it is my clinical judgment that the patient will require inpatient hospital care spanning beyond 2 midnights from the point of admission due to high intensity of service, high risk for further deterioration and high frequency of surveillance required.*  Level of care: Progressive   I personally spent a total of 75 minutes in the care of the patient today including preparing to see the patient, getting/reviewing separately obtained history, performing a medically appropriate exam/evaluation, placing orders, documenting clinical information in the EHR, independently interpreting results, and communicating results.   Lou Claretta HERO, MD 10/30/2024, 10:16 PM [facility] Pager: [phone] Isaiah 41:10   If 7PM-7AM, please contact night-coverage [email or web address] Password [email or web address]     [1]  Allergies Allergen Reactions   Betadine [Povidone Iodine] Other (See Comments)    Skin irritation   Vicodin  [Hydrocodone-Acetaminophen ] Palpitations    Chest pain   Hydrocodone-Acetaminophen     Iodine    "

## 2024-10-30 NOTE — ED Provider Notes (Signed)
 " [facility]  Note:  This document was prepared using Dragon voice recognition software and may include unintentional dictation errors.  MRN: [id number] DOB: Apr 09, 1971  Subjective:   Donna Whitaker is a 54 y.o. female presenting for right sided chest pain and shortness of breath since Saturday.  Patient reports that she has had flu for the last 7 days and began having chest tightness and shortness of breath Saturday evening.  Patient denies any fever, body aches, headache, cough, nasal congestion.  Patient has past history of COPD, diabetes type 2, hypertension, obesity and aortic atherosclerosis.  Patient denies any past history of cardiac disease.  Current Medications[1]   Allergies[2]  Past Medical History:  Diagnosis Date   Allergy    Anxiety    Arthritis    knees, back   COPD (chronic obstructive pulmonary disease) (HCC)    uses albuterol  as needed   Depression    Diabetes mellitus without complication (HCC)    Heavy smoker    2ppd   History of chicken pox    Hypertension    Hypothyroidism    Intraductal papilloma of breast, left      Past Surgical History:  Procedure Laterality Date   BREAST LUMPECTOMY WITH RADIOACTIVE SEED LOCALIZATION Left 08/08/2020   Procedure: LEFT BREAST LUMPECTOMY WITH RADIOACTIVE SEED LOCALIZATION;  Surgeon: Vernetta Berg, MD;  Location: [facility];  Service: General;  Laterality: Left;  LMA   TUBAL LIGATION  1992   WISDOM TOOTH EXTRACTION      Family History  Problem Relation Age of Onset   Arthritis Mother    Hyperlipidemia Mother    Alcohol abuse Father    Hyperlipidemia Father    Heart disease Father    Hypertension Father    Arthritis Maternal Grandmother    Hyperlipidemia Maternal Grandmother    Heart disease Maternal Grandmother    Hypertension Maternal Grandmother    Diabetes Maternal Grandmother    Hyperlipidemia Maternal Grandfather    Hypertension Maternal Grandfather     Hyperlipidemia Paternal Grandmother    Hypertension Paternal Grandmother    Stomach cancer Paternal Grandmother    Alcohol abuse Paternal Grandfather    Hyperlipidemia Paternal Grandfather    Heart disease Paternal Grandfather    Hypertension Paternal Grandfather     Social History[3]  ROS Refer to HPI for ROS details.  Objective:    Vitals: BP (!) 94/58 (BP Location: Left Arm)   Pulse (!) 101   Temp 98.4 F (36.9 C) (Oral)   Resp 20   LMP 05/19/2016 Comment: BTL  SpO2 93%   Physical Exam Vitals and nursing note reviewed.  Constitutional:      General: She is not in acute distress.    Appearance: She is well-developed. She is not ill-appearing or toxic-appearing.  HENT:     Head: Normocephalic and atraumatic.     Nose: Nose normal.     Mouth/Throat:     Mouth: Mucous membranes are moist.  Cardiovascular:     Rate and Rhythm: Regular rhythm. Tachycardia present.     Heart sounds: Normal heart sounds. No murmur heard. Pulmonary:     Effort: Pulmonary effort is normal. No respiratory distress.     Breath sounds: No stridor. Rhonchi present. No wheezing or rales.  Chest:     Chest wall: No tenderness.  Skin:    General: Skin is warm and dry.  Neurological:     General: No focal deficit present.     Mental  Status: She is alert and oriented to person, place, and time.  Psychiatric:        Mood and Affect: Mood normal.        Behavior: Behavior normal.     Procedures  No results found for this or any previous visit (from the past 24 hours).  Assessment and Plan :     Discharge Instructions       1. Atypical chest pain (Primary) 2. Shortness of breath - EKG 12-Lead completed in UC shows sinus tachycardia with a ventricular rate of 101 bpm, no STEMI, otherwise normal EKG. - Based on current presentation with severe shortness of breath, chest pain after being sick with influenza for the last 5 to 7 days, moderate concern for secondary pneumonia as the cause  of symptoms however acute cardiac syndrome cannot be ruled out in urgent care. - Recommend immediate follow-up in the ER after leaving urgent care for further evaluation and treatment. - Patient is currently diaphoretic, complaining of chest pain and severe shortness of breath acute coronary syndrome versus pulmonary embolus must be evaluated and ruled out as potential causes of symptoms.      Elton Catalano B Jason Frisbee    [1] No current facility-administered medications for this encounter.  Current Outpatient Medications:    atorvastatin  (LIPITOR) 10 MG tablet, Take 1 tablet (10 mg total) by mouth daily., Disp: 90 tablet, Rfl: 1   Cholecalciferol  (EQL VITAMIN D3) 50 MCG (2000 UT) CAPS, Take by mouth., Disp: , Rfl:    glipiZIDE  (GLUCOTROL ) 10 MG tablet, TAKE 1 TABLET BY MOUTH TWICE DAILY BEFORE A MEAL, Disp: 180 tablet, Rfl: 0   glipiZIDE  (GLUCOTROL ) 5 MG tablet, TAKE 1 TABLET BY MOUTH TWICE DAILY BEFORE MEAL(S), Disp: 180 tablet, Rfl: 0   hydrOXYzine  (VISTARIL ) 100 MG capsule, Take 1 capsule by mouth twice daily, Disp: 180 capsule, Rfl: 1   Lancets (ONETOUCH ULTRASOFT) lancets, 1 each by Other route in the morning and at bedtime., Disp: 200 each, Rfl: 1   levothyroxine  (SYNTHROID ) 125 MCG tablet, Take 1 tablet by mouth once daily, Disp: 90 tablet, Rfl: 0   losartan  (COZAAR ) 100 MG tablet, Take 1 tablet by mouth once daily, Disp: 90 tablet, Rfl: 0   methocarbamol  (ROBAXIN ) 500 MG tablet, Take 1 tablet (500 mg total) by mouth every 8 (eight) hours as needed for muscle spasms., Disp: 60 tablet, Rfl: 2   ONETOUCH ULTRA test strip, 1 EACH BY OTHER ROUTE IN THE MORNING AND AT BEDTIME., Disp: 200 strip, Rfl: 1   predniSONE  (DELTASONE ) 10 MG tablet, Take 3 tabs on days 1-3, 2 tabs on days 4-6, 1 tab on days 7-9, Disp: 18 tablet, Rfl: 0   sertraline  (ZOLOFT ) 100 MG tablet, Take 1 tablet by mouth once daily, Disp: 90 tablet, Rfl: 0 [2]  Allergies Allergen Reactions   Betadine [Povidone Iodine] Other (See  Comments)    Skin irritation   Vicodin [Hydrocodone-Acetaminophen ] Palpitations    Chest pain   Hydrocodone-Acetaminophen     Iodine   [3]  Social History Tobacco Use   Smoking status: Every Day    Current packs/day: 2.00    Average packs/day: 2.0 packs/day for 35.0 years (70.0 ttl pk-yrs)    Types: Cigarettes   Smokeless tobacco: Never  Vaping Use   Vaping status: Never Used  Substance Use Topics   Alcohol use: Yes    Comment: occasional   Drug use: No     Aurea Goodell B, NP 10/30/24 1433  "

## 2024-10-30 NOTE — ED Triage Notes (Signed)
 Pt has c/o right chest pain and shortness breath and that started Saturday night. Pt stated she had the flu x 7 days, and woke up with chest pain and shortness of breath.

## 2024-10-30 NOTE — Discharge Instructions (Addendum)
" °  1. Atypical chest pain (Primary) 2. Shortness of breath - EKG 12-Lead completed in UC shows sinus tachycardia with a ventricular rate of 101 bpm, no STEMI, otherwise normal EKG. - Based on current presentation with severe shortness of breath, chest pain after being sick with influenza for the last 5 to 7 days, moderate concern for secondary pneumonia as the cause of symptoms however acute cardiac syndrome cannot be ruled out in urgent care. - Recommend immediate follow-up in the ER after leaving urgent care for further evaluation and treatment. - Patient is currently diaphoretic, complaining of chest pain and severe shortness of breath acute coronary syndrome versus pulmonary embolus must be evaluated and ruled out as potential causes of symptoms. "

## 2024-10-30 NOTE — ED Notes (Signed)
 Patient is being discharged from the Urgent Care and sent to the Emergency Department via POV with husband . Per Hosey Reddick,NP, patient is in need of higher level of care due to Chest pain. Patient is aware and verbalizes understanding of plan of care.  Vitals:   10/30/24 1406  BP: (!) 94/58  Pulse: (!) 101  Resp: 20  Temp: 98.4 F (36.9 C)  SpO2: 93%

## 2024-10-30 NOTE — ED Notes (Signed)
 Xray at bedside

## 2024-10-31 DIAGNOSIS — A419 Sepsis, unspecified organism: Secondary | ICD-10-CM | POA: Diagnosis not present

## 2024-10-31 DIAGNOSIS — R6520 Severe sepsis without septic shock: Secondary | ICD-10-CM | POA: Diagnosis not present

## 2024-10-31 DIAGNOSIS — E861 Hypovolemia: Secondary | ICD-10-CM

## 2024-10-31 DIAGNOSIS — J189 Pneumonia, unspecified organism: Secondary | ICD-10-CM

## 2024-10-31 DIAGNOSIS — E66813 Obesity, class 3: Secondary | ICD-10-CM

## 2024-10-31 DIAGNOSIS — R0781 Pleurodynia: Secondary | ICD-10-CM

## 2024-10-31 DIAGNOSIS — J9601 Acute respiratory failure with hypoxia: Secondary | ICD-10-CM

## 2024-10-31 DIAGNOSIS — N179 Acute kidney failure, unspecified: Secondary | ICD-10-CM

## 2024-10-31 LAB — BLOOD CULTURE ID PANEL (REFLEXED) - BCID2

## 2024-10-31 LAB — CBC
HCT: 35.8 % — ABNORMAL LOW (ref 36.0–46.0)
Hemoglobin: 12 g/dL (ref 12.0–15.0)
MCH: 29.8 pg (ref 26.0–34.0)
MCHC: 33.5 g/dL (ref 30.0–36.0)
MCV: 88.8 fL (ref 80.0–100.0)
Platelets: 210 K/uL (ref 150–400)
RBC: 4.03 MIL/uL (ref 3.87–5.11)
RDW: 13.2 % (ref 11.5–15.5)
WBC: 10.1 K/uL (ref 4.0–10.5)
nRBC: 0 % (ref 0.0–0.2)

## 2024-10-31 LAB — BASIC METABOLIC PANEL WITH GFR
Anion gap: 11 (ref 5–15)
BUN: 27 mg/dL — ABNORMAL HIGH (ref 6–20)
CO2: 24 mmol/L (ref 22–32)
Calcium: 8.3 mg/dL — ABNORMAL LOW (ref 8.9–10.3)
Chloride: 97 mmol/L — ABNORMAL LOW (ref 98–111)
Creatinine, Ser: 0.96 mg/dL (ref 0.44–1.00)
GFR, Estimated: >60 mL/min
Glucose, Bld: 163 mg/dL — ABNORMAL HIGH (ref 70–99)
Potassium: 3.6 mmol/L (ref 3.5–5.1)
Sodium: 132 mmol/L — ABNORMAL LOW (ref 135–145)

## 2024-10-31 LAB — URINALYSIS, W/ REFLEX TO CULTURE (INFECTION SUSPECTED)
Bilirubin Urine: NEGATIVE
Glucose, UA: NEGATIVE mg/dL
Hgb urine dipstick: NEGATIVE
Ketones, ur: NEGATIVE mg/dL
Nitrite: NEGATIVE
Protein, ur: NEGATIVE mg/dL
Specific Gravity, Urine: 1.006 (ref 1.005–1.030)
pH: 5 (ref 5.0–8.0)

## 2024-10-31 LAB — HEMOGLOBIN A1C
Hgb A1c MFr Bld: 8.1 % — ABNORMAL HIGH (ref 4.8–5.6)
Mean Plasma Glucose: 185.77 mg/dL

## 2024-10-31 LAB — CORTISOL-AM, BLOOD: Cortisol - AM: 18.4 ug/dL (ref 6.7–22.6)

## 2024-10-31 LAB — PROCALCITONIN: Procalcitonin: 20.1 ng/mL

## 2024-10-31 LAB — I-STAT CG4 LACTIC ACID, ED: Lactic Acid, Venous: 1.5 mmol/L (ref 0.5–1.9)

## 2024-10-31 LAB — HIV ANTIBODY (ROUTINE TESTING W REFLEX): HIV Screen 4th Generation wRfx: NONREACTIVE

## 2024-10-31 LAB — CBG MONITORING, ED: Glucose-Capillary: 148 mg/dL — ABNORMAL HIGH (ref 70–99)

## 2024-10-31 MED ORDER — GUAIFENESIN 100 MG/5ML PO LIQD
5.0000 mL | ORAL | Status: DC | PRN
  Administered 2024-10-31 (×2): 5 mL via ORAL
  Filled 2024-10-31 (×2): qty 10

## 2024-10-31 MED ORDER — SODIUM CHLORIDE 0.9 % IV SOLN
100.0000 mg | Freq: Two times a day (BID) | INTRAVENOUS | Status: DC
  Administered 2024-10-31: 100 mg via INTRAVENOUS
  Filled 2024-10-31: qty 100

## 2024-10-31 MED ORDER — CEFPODOXIME PROXETIL 200 MG PO TABS: 200.0000 mg | ORAL_TABLET | Freq: Two times a day (BID) | ORAL | 0 refills | Status: DC

## 2024-10-31 MED ORDER — SODIUM CHLORIDE 0.9 % IV BOLUS
1000.0000 mL | Freq: Once | INTRAVENOUS | Status: AC
  Administered 2024-10-31: 1000 mL via INTRAVENOUS

## 2024-10-31 MED ORDER — SODIUM CHLORIDE 0.9 % IV SOLN: 1.0000 g | INTRAVENOUS | Status: DC

## 2024-10-31 MED ORDER — DOXYCYCLINE HYCLATE 100 MG PO TABS: 100.0000 mg | ORAL_TABLET | Freq: Two times a day (BID) | ORAL | 0 refills | Status: AC

## 2024-10-31 MED ORDER — IPRATROPIUM-ALBUTEROL 0.5-2.5 (3) MG/3ML IN SOLN
3.0000 mL | Freq: Four times a day (QID) | RESPIRATORY_TRACT | Status: DC | PRN
  Administered 2024-10-31: 3 mL via RESPIRATORY_TRACT
  Filled 2024-10-31 (×2): qty 3

## 2024-10-31 MED ORDER — AMOXICILLIN-POT CLAVULANATE 875-125 MG PO TABS: 1.0000 | ORAL_TABLET | Freq: Two times a day (BID) | ORAL | 0 refills | Status: AC

## 2024-10-31 NOTE — ED Notes (Signed)
 DC instructions and scripts reviewed with pt no questions or concerns at this time. Will follow up with pcp. No questions or concerns at this time.

## 2024-10-31 NOTE — ED Notes (Signed)
 Awaiting Glucotol from main pharmacy.

## 2024-10-31 NOTE — Discharge Summary (Signed)
 " Physician Discharge Summary   Patient: Donna Whitaker MRN: [id number] DOB: Jan 23, 1971  Admit date:     10/30/2024  Discharge date: {dischdate:26783}  Discharge Physician: Casimer Dare   PCP: Antonette Angeline ORN, NP   Recommendations at discharge:  {Tip this will not be part of the note when signed- Example include specific recommendations for outpatient follow-up, pending tests to follow-up on. (Optional):26781}  ***  Discharge Diagnoses: Principal Problem:   Severe sepsis (HCC) Active Problems:   Pneumonia of right upper lobe due to infectious organism   Pleuritic chest pain   Hypotension due to hypovolemia   AKI (acute kidney injury)   Acute hypoxic respiratory failure (HCC)   Obesity, Class III, BMI 40-49.9 (morbid obesity) (HCC)  Resolved Problems:   * No resolved hospital problems. [facility] Course: No notes on file  Assessment and Plan: No notes have been filed under this hospital service. Service: Hospitalist     {Tip this will not be part of the note when signed Body mass index is 44.63 kg/m. , ,  (Optional):26781}  {(NOTE) Pain control PDMP Statment (Optional):26782} Consultants: *** Procedures performed: ***  Disposition: {Plan; Disposition:26390} Diet recommendation:  {Diet_Plan:26776} DISCHARGE MEDICATION: Allergies as of 10/31/2024       Reactions   Betadine [povidone Iodine] Other (See Comments)   Skin irritation   Vicodin [hydrocodone-acetaminophen ] Palpitations   Chest pain   Hydrocodone-acetaminophen     Iodine         Medication List     STOP taking these medications    predniSONE  10 MG tablet Commonly known as: DELTASONE        TAKE these medications    acetaminophen  500 MG tablet Commonly known as: TYLENOL  Take 1,000 mg by mouth every 6 (six) hours as needed for fever.   atorvastatin  10 MG tablet Commonly known as: LIPITOR Take 1 tablet (10 mg total) by mouth daily.   cefpodoxime  200 MG tablet Commonly known as:  VANTIN  Take 1 tablet (200 mg total) by mouth 2 (two) times daily for 6 days.   DAYQUIL/NYQUIL COLD/FLU RELIEF PO Take 1 Capful by mouth daily as needed (FLU SYMPTOMS).   doxycycline  100 MG tablet Commonly known as: VIBRA -TABS Take 1 tablet (100 mg total) by mouth 2 (two) times daily for 6 days.   EQL Vitamin D3 50 MCG (2000 UT) Caps Generic drug: Cholecalciferol  Take by mouth.   glipiZIDE  10 MG tablet Commonly known as: GLUCOTROL  TAKE 1 TABLET BY MOUTH TWICE DAILY BEFORE A MEAL What changed:  when to take this Another medication with the same name was removed. Continue taking this medication, and follow the directions you see here.   hydrOXYzine  100 MG capsule Commonly known as: VISTARIL  Take 1 capsule by mouth twice daily   Ibuprofen  200 MG Caps Take 1 capsule by mouth every 6 (six) hours as needed (fever).   levothyroxine  125 MCG tablet Commonly known as: SYNTHROID  Take 1 tablet by mouth once daily   losartan  100 MG tablet Commonly known as: COZAAR  Take 1 tablet by mouth once daily   methocarbamol  500 MG tablet Commonly known as: ROBAXIN  Take 1 tablet (500 mg total) by mouth every 8 (eight) hours as needed for muscle spasms.   sertraline  100 MG tablet Commonly known as: ZOLOFT  Take 1 tablet by mouth once daily        Discharge Exam: Filed Weights   10/30/24 1557  Weight: 136.1 kg   ***  Condition at discharge: {DC Condition:26389}  The results  of significant diagnostics from this hospitalization (including imaging, microbiology, ancillary and laboratory) are listed below for reference.   Imaging Studies: DG Chest Port 1 View Result Date: 10/30/2024 CLINICAL DATA:  Shortness of breath and sepsis. EXAM: PORTABLE CHEST 1 VIEW COMPARISON:  None Available. FINDINGS: The cardiac silhouette, mediastinal and hilar contours are within normal limits. Fairly dense airspace consolidation in the right upper lobe consistent with pneumonia. Suspect small right  parapneumonic effusion. Streaky bibasilar atelectasis. The bony thorax is intact. IMPRESSION: Right upper lobe pneumonia. Followup PA and lateral chest X-ray is recommended in 3-4 weeks following trial of antibiotic therapy to ensure resolution and exclude underlying malignancy. Electronically Signed   By: MYRTIS Stammer M.D.   On: 10/30/2024 17:37    Microbiology: Results for orders placed or performed during the hospital encounter of 10/30/24  Blood Culture (routine x 2)     Status: None (Preliminary result)   Collection Time: 10/30/24  4:17 PM   Specimen: BLOOD  Result Value Ref Range Status   Specimen Description BLOOD SITE NOT SPECIFIED  Final   Special Requests   Final    BOTTLES DRAWN AEROBIC AND ANAEROBIC Blood Culture adequate volume   Culture  Setup Time Organism ID to follow  Final   Culture   Final    NO GROWTH < 24 HOURS Performed at [facility], [address], [place], [place] [address]    Report Status PENDING  Incomplete  Blood Culture (routine x 2)     Status: None (Preliminary result)   Collection Time: 10/30/24  4:36 PM   Specimen: BLOOD  Result Value Ref Range Status   Specimen Description BLOOD SITE NOT SPECIFIED  Final   Special Requests   Final    BOTTLES DRAWN AEROBIC AND ANAEROBIC Blood Culture results may not be optimal due to an inadequate volume of blood received in culture bottles   Culture   Final    NO GROWTH < 24 HOURS Performed at [facility], [address], [place], [place] [address]    Report Status PENDING  Incomplete    Labs: CBC: Recent Labs  Lab 10/30/24 1626 10/31/24 0148  WBC 10.2 10.1  NEUTROABS 8.5*  --   HGB 14.1 12.0  HCT 42.3 35.8*  MCV 89.1 88.8  PLT 243 210   Basic Metabolic Panel: Recent Labs  Lab 10/30/24 1626 10/31/24 0148  NA 131* 132*  K 3.8 3.6  CL 93* 97*  CO2 24 24  GLUCOSE 202* 163*  BUN 26* 27*  CREATININE 1.22* 0.96  CALCIUM  8.7* 8.3*   Liver Function Tests: Recent Labs  Lab  10/30/24 1626  AST 17  ALT 19  ALKPHOS 104  BILITOT 1.1  PROT 6.7  ALBUMIN 3.6   CBG: Recent Labs  Lab 10/30/24 2233 10/31/24 0744  GLUCAP 186* 148*    Discharge time spent: {LESS THAN/GREATER THAN:26388} 30 minutes.  Signed: Casimer Dare, MD [facility] 10/31/2024 "

## 2024-10-31 NOTE — ED Notes (Signed)
 Admitting MD made aware of pts continued low blood pressures. Admitting MD ordered fluid bolus at this time.

## 2024-10-31 NOTE — Progress Notes (Signed)
 PHARMACY - PHYSICIAN COMMUNICATION CRITICAL VALUE ALERT - BLOOD CULTURE IDENTIFICATION (BCID)  Donna Whitaker is an 54 y.o. female who presented to [facility] on 10/30/2024 with a chief complaint of CAP.   Assessment:  54 year old female admitted with community acquired pneumonia. Now with strep pneumo in 1/4 blood cultures.   Name of physician (or Provider) Contacted: Casimer Dare  Current antibiotics: Ceftriaxone , doxy   Changes to prescribed antibiotics recommended:  Spoke with physician- Patient refusing to stay for admission.  Recommended changing discharge prescription to augmentin  875 BID - MD prescribing for 9 days to complete 10 total   Results for orders placed or performed during the hospital encounter of 10/30/24  Blood Culture ID Panel (Reflexed) (Collected: 10/30/2024  4:17 PM)  Result Value Ref Range   Enterococcus faecalis NOT DETECTED NOT DETECTED   Enterococcus Faecium NOT DETECTED NOT DETECTED   Listeria monocytogenes NOT DETECTED NOT DETECTED   Staphylococcus species NOT DETECTED NOT DETECTED   Staphylococcus aureus (BCID) NOT DETECTED NOT DETECTED   Staphylococcus epidermidis NOT DETECTED NOT DETECTED   Staphylococcus lugdunensis NOT DETECTED NOT DETECTED   Streptococcus species DETECTED (A) NOT DETECTED   Streptococcus agalactiae NOT DETECTED NOT DETECTED   Streptococcus pneumoniae DETECTED (A) NOT DETECTED   Streptococcus pyogenes NOT DETECTED NOT DETECTED   A.calcoaceticus-baumannii NOT DETECTED NOT DETECTED   Bacteroides fragilis NOT DETECTED NOT DETECTED   Enterobacterales NOT DETECTED NOT DETECTED   Enterobacter cloacae complex NOT DETECTED NOT DETECTED   Escherichia coli NOT DETECTED NOT DETECTED   Klebsiella aerogenes NOT DETECTED NOT DETECTED   Klebsiella oxytoca NOT DETECTED NOT DETECTED   Klebsiella pneumoniae NOT DETECTED NOT DETECTED   Proteus species NOT DETECTED NOT DETECTED   Salmonella species NOT DETECTED NOT DETECTED   Serratia marcescens  NOT DETECTED NOT DETECTED   Haemophilus influenzae NOT DETECTED NOT DETECTED   Neisseria meningitidis NOT DETECTED NOT DETECTED   Pseudomonas aeruginosa NOT DETECTED NOT DETECTED   Stenotrophomonas maltophilia NOT DETECTED NOT DETECTED   Candida albicans NOT DETECTED NOT DETECTED   Candida auris NOT DETECTED NOT DETECTED   Candida glabrata NOT DETECTED NOT DETECTED   Candida krusei NOT DETECTED NOT DETECTED   Candida parapsilosis NOT DETECTED NOT DETECTED   Candida tropicalis NOT DETECTED NOT DETECTED   Cryptococcus neoformans/gattii NOT DETECTED NOT DETECTED    Damien Quiet, PharmD, BCPS, BCIDP Infectious Diseases Clinical Pharmacist Phone: [phone] 10/31/2024  12:24 PM

## 2024-11-02 LAB — CULTURE, BLOOD (ROUTINE X 2): Special Requests: ADEQUATE

## 2024-11-04 LAB — CULTURE, BLOOD (ROUTINE X 2): Culture: NO GROWTH
